# Patient Record
Sex: Male | Born: 1950 | Race: White | Hispanic: No | Marital: Single | Smoking: Current some day smoker
Health system: Southern US, Community
[De-identification: ages and names within clinical notes are randomized; demographics above are authoritative.]

## PROBLEM LIST (undated history)

## (undated) DIAGNOSIS — E785 Hyperlipidemia, unspecified: Secondary | ICD-10-CM

## (undated) DIAGNOSIS — I10 Essential (primary) hypertension: Secondary | ICD-10-CM

## (undated) DIAGNOSIS — F32A Depression, unspecified: Secondary | ICD-10-CM

## (undated) DIAGNOSIS — F419 Anxiety disorder, unspecified: Secondary | ICD-10-CM

## (undated) DIAGNOSIS — E039 Hypothyroidism, unspecified: Secondary | ICD-10-CM

## (undated) DIAGNOSIS — C801 Malignant (primary) neoplasm, unspecified: Secondary | ICD-10-CM

## (undated) DIAGNOSIS — C8330 Diffuse large B-cell lymphoma, unspecified site: Secondary | ICD-10-CM

## (undated) DIAGNOSIS — I2510 Atherosclerotic heart disease of native coronary artery without angina pectoris: Secondary | ICD-10-CM

## (undated) DIAGNOSIS — F0390 Unspecified dementia without behavioral disturbance: Principal | ICD-10-CM

## (undated) DIAGNOSIS — I519 Heart disease, unspecified: Secondary | ICD-10-CM

## (undated) DIAGNOSIS — B20 Human immunodeficiency virus [HIV] disease: Secondary | ICD-10-CM

## (undated) DIAGNOSIS — E079 Disorder of thyroid, unspecified: Secondary | ICD-10-CM

## (undated) DIAGNOSIS — K759 Inflammatory liver disease, unspecified: Secondary | ICD-10-CM

## (undated) DIAGNOSIS — Z5189 Encounter for other specified aftercare: Secondary | ICD-10-CM

## (undated) DIAGNOSIS — I499 Cardiac arrhythmia, unspecified: Secondary | ICD-10-CM

## (undated) DIAGNOSIS — IMO0001 Reserved for inherently not codable concepts without codable children: Secondary | ICD-10-CM

## (undated) DIAGNOSIS — B2700 Gammaherpesviral mononucleosis without complication: Secondary | ICD-10-CM

## (undated) DIAGNOSIS — K219 Gastro-esophageal reflux disease without esophagitis: Secondary | ICD-10-CM

## (undated) DIAGNOSIS — F329 Major depressive disorder, single episode, unspecified: Secondary | ICD-10-CM

## (undated) DIAGNOSIS — J189 Pneumonia, unspecified organism: Secondary | ICD-10-CM

## (undated) DIAGNOSIS — D649 Anemia, unspecified: Secondary | ICD-10-CM

## (undated) DIAGNOSIS — R0602 Shortness of breath: Secondary | ICD-10-CM

## (undated) HISTORY — DX: Atherosclerotic heart disease of native coronary artery without angina pectoris: I25.10

## (undated) HISTORY — DX: Depression, unspecified: F32.A

## (undated) HISTORY — DX: Anxiety disorder, unspecified: F41.9

## (undated) HISTORY — PX: OTHER SURGICAL HISTORY: SHX169

## (undated) HISTORY — DX: Diffuse large B-cell lymphoma, unspecified site: C83.30

## (undated) HISTORY — DX: Anemia, unspecified: D64.9

## (undated) HISTORY — DX: Gammaherpesviral mononucleosis without complication: B27.00

## (undated) HISTORY — PX: CARDIAC SURGERY: SHX584

## (undated) HISTORY — PX: COLONOSCOPY: SHX174

## (undated) HISTORY — DX: Unspecified dementia without behavioral disturbance: F03.90

## (undated) HISTORY — DX: Hyperlipidemia, unspecified: E78.5

## (undated) HISTORY — DX: Encounter for other specified aftercare: Z51.89

## (undated) HISTORY — DX: Hypothyroidism, unspecified: E03.9

## (undated) HISTORY — DX: Major depressive disorder, single episode, unspecified: F32.9

## (undated) HISTORY — PX: TUMOR REMOVAL: SHX12

## (undated) HISTORY — PX: ABDOMINAL SURGERY: SHX537

## (undated) HISTORY — DX: Disorder of thyroid, unspecified: E07.9

## (undated) HISTORY — DX: Reserved for inherently not codable concepts without codable children: IMO0001

## (undated) HISTORY — DX: Gastro-esophageal reflux disease without esophagitis: K21.9

---

## 2011-03-26 ENCOUNTER — Encounter: Payer: Self-pay | Admitting: *Deleted

## 2011-03-26 ENCOUNTER — Emergency Department (HOSPITAL_COMMUNITY)
Admission: EM | Admit: 2011-03-26 | Discharge: 2011-03-26 | Disposition: A | Discharge status: Home / Self Care | Payer: Medicaid Other | Attending: Emergency Medicine | Admitting: Emergency Medicine

## 2011-03-26 DIAGNOSIS — C8589 Other specified types of non-Hodgkin lymphoma, extranodal and solid organ sites: Secondary | ICD-10-CM | POA: Insufficient documentation

## 2011-03-26 DIAGNOSIS — F17200 Nicotine dependence, unspecified, uncomplicated: Secondary | ICD-10-CM | POA: Insufficient documentation

## 2011-03-26 DIAGNOSIS — IMO0002 Reserved for concepts with insufficient information to code with codable children: Secondary | ICD-10-CM | POA: Insufficient documentation

## 2011-03-26 DIAGNOSIS — Z21 Asymptomatic human immunodeficiency virus [HIV] infection status: Secondary | ICD-10-CM | POA: Insufficient documentation

## 2011-03-26 DIAGNOSIS — S90862A Insect bite (nonvenomous), left foot, initial encounter: Secondary | ICD-10-CM

## 2011-03-26 DIAGNOSIS — M7989 Other specified soft tissue disorders: Secondary | ICD-10-CM | POA: Insufficient documentation

## 2011-03-26 HISTORY — DX: Malignant (primary) neoplasm, unspecified: C80.1

## 2011-03-26 HISTORY — DX: Human immunodeficiency virus (HIV) disease: B20

## 2011-03-26 LAB — CBC
Hemoglobin: 9.7 g/dL — ABNORMAL LOW (ref 13.0–17.0)
MCHC: 34.3 g/dL (ref 30.0–36.0)
RDW: 20.3 % — ABNORMAL HIGH (ref 11.5–15.5)
WBC: 1.5 10*3/uL — ABNORMAL LOW (ref 4.0–10.5)

## 2011-03-26 LAB — COMPREHENSIVE METABOLIC PANEL
AST: 14 U/L (ref 0–37)
Albumin: 3.8 g/dL (ref 3.5–5.2)
Alkaline Phosphatase: 99 U/L (ref 39–117)
Chloride: 99 mEq/L (ref 96–112)
Potassium: 4.1 mEq/L (ref 3.5–5.1)
Total Bilirubin: 0.5 mg/dL (ref 0.3–1.2)

## 2011-03-26 LAB — DIFFERENTIAL
Basophils Absolute: 0 10*3/uL (ref 0.0–0.1)
Basophils Relative: 1 % (ref 0–1)
Monocytes Relative: 14 % — ABNORMAL HIGH (ref 3–12)
Neutro Abs: 0.7 10*3/uL — ABNORMAL LOW (ref 1.7–7.7)
Neutrophils Relative %: 43 % (ref 43–77)

## 2011-03-26 MED ORDER — DIPHENHYDRAMINE HCL 25 MG PO CAPS: 25.0000 mg | ORAL_CAPSULE | Freq: Four times a day (QID) | ORAL | Status: AC | PRN

## 2011-03-26 MED ORDER — DIPHENHYDRAMINE HCL 50 MG/ML IJ SOLN
50.0000 mg | Freq: Once | INTRAMUSCULAR | Status: AC
  Administered 2011-03-26: 50 mg via INTRAVENOUS
  Filled 2011-03-26: qty 1

## 2011-03-26 MED ORDER — DIPHENHYDRAMINE HCL 25 MG PO CAPS: 25.0000 mg | ORAL_CAPSULE | Freq: Four times a day (QID) | ORAL | Status: DC | PRN

## 2011-03-26 NOTE — ED Notes (Signed)
Pt sitting in wheelchair  With lt foot elevated on bed and ice pack on it.

## 2011-03-26 NOTE — ED Notes (Signed)
States he was outside and was bitten by something, states his left leg immediately began to swell

## 2011-03-26 NOTE — ED Provider Notes (Signed)
History     CSN: [id number] Arrival date & time: 03/26/2011  8:18 PM  Chief Complaint  Patient presents with  . Leg Swelling   HPI Comments: Patient has a history of HIV and cancer, was sitting outside on the edge of the sidewalk and felt something bite the bottom of his left foot inside his shoe. He noticed almost immediate onset of swelling of the foot and the leg and over the last 3 hours this has gotten gradually worse. It is constant, moderate, gradually getting worse, associated with tingling and paresthesias of his left hand. He denies any rashes, pruritus, difficulty breathing, wheezing, swelling of his throat. He has not had a rash and he has never had any similar reactions in the past. He thinks he saw a large white and run away after he kicked his shoe off. He denies any snakebites. He also denies shortness of breath, cough, chest pain, nausea, vomiting, fever.  The history is provided by the patient and a caregiver.    Past Medical History  Diagnosis Date  . Cancer   . HIV (human immunodeficiency virus infection)     Past Surgical History  Procedure Date  . Cardiac surgery     No family history on file.  History  Substance Use Topics  . Smoking status: Current Everyday Smoker -- 0.5 packs/day  . Smokeless tobacco: Not on file  . Alcohol Use: Yes     rarely      Review of Systems  All other systems reviewed and are negative.    Physical Exam  BP 134/77  Pulse 93  Temp(Src) 98.7 F (37.1 C) (Oral)  Resp 20  Ht 6\' 2"  (1.88 m)  Wt 192 lb (87.091 kg)  BMI 24.65 kg/m2  SpO2 100%  Physical Exam  Nursing note and vitals reviewed. Constitutional: He appears well-developed and well-nourished. No distress.  HENT:  Head: Normocephalic and atraumatic.  Mouth/Throat: Oropharynx is clear and moist. No oropharyngeal exudate.  Eyes: Conjunctivae and EOM are normal. Right eye exhibits no discharge. Left eye exhibits no discharge. No scleral icterus.  Neck: Normal  range of motion. Neck supple. No JVD present. No thyromegaly present.  Cardiovascular: Normal rate, regular rhythm, normal heart sounds and intact distal pulses.  Exam reveals no gallop and no friction rub.   No murmur heard. Pulmonary/Chest: Effort normal and breath sounds normal. No respiratory distress. He has no wheezes. He has no rales.  Abdominal: Soft. Bowel sounds are normal. He exhibits no distension and no mass. There is no tenderness.  Musculoskeletal: Normal range of motion. He exhibits edema (left lower extremity with pitting edema anteriorly over the tibia from the foot to the knee. No edema of the right lower extremity.).  Lymphadenopathy:    He has no cervical adenopathy.  Neurological: He is alert. Coordination normal.  Skin: Skin is warm and dry. No rash noted. No erythema.  Psychiatric: He has a normal mood and affect. His behavior is normal.    ED Course  Procedures  MDM Pulmonary exam is normal, vital signs are normal, rather acute in onset of swelling but suspect insect bite causing the same. We'll check for coagulopathy, LFTs, CBC for potential systemic reaction. Blood pressure is normal, no respiratory fracture. Benadryl ordered, steroids held due to immunocompromised status with HIV.    Pt admits to have recent chemo for lymphoma - results reviewed with pt - afebrile, no other c/o - swelling stable, paresthesias improved, labs reviewed below.  I have asked  pt to return tomorrow for reevaluation when I'm here after 3 PM or sooner if sx worsen.  Results for orders placed during the hospital encounter of 03/26/11  CBC      Component Value Range   WBC 1.5 (*) 4.0 - 10.5 (K/uL)   RBC 2.94 (*) 4.22 - 5.81 (MIL/uL)   Hemoglobin 9.7 (*) 13.0 - 17.0 (g/dL)   HCT 14.7 (*) 82.9 - 52.0 (%)   MCV 96.3  78.0 - 100.0 (fL)   MCH 33.0  26.0 - 34.0 (pg)   MCHC 34.3  30.0 - 36.0 (g/dL)   RDW 56.2 (*) 13.0 - 15.5 (%)   Platelets 120 (*) 150 - 400 (K/uL)  DIFFERENTIAL       Component Value Range   Neutrophils Relative 43  43 - 77 (%)   Neutro Abs 0.7 (*) 1.7 - 7.7 (K/uL)   Lymphocytes Relative 38  12 - 46 (%)   Lymphs Abs 0.6 (*) 0.7 - 4.0 (K/uL)   Monocytes Relative 14 (*) 3 - 12 (%)   Monocytes Absolute 0.2  0.1 - 1.0 (K/uL)   Eosinophils Relative 5  0 - 5 (%)   Eosinophils Absolute 0.1  0.0 - 0.7 (K/uL)   Basophils Relative 1  0 - 1 (%)   Basophils Absolute 0.0  0.0 - 0.1 (K/uL)  COMPREHENSIVE METABOLIC PANEL      Component Value Range   Sodium 135  135 - 145 (mEq/L)   Potassium 4.1  3.5 - 5.1 (mEq/L)   Chloride 99  96 - 112 (mEq/L)   CO2 26  19 - 32 (mEq/L)   Glucose, Bld 143 (*) 70 - 99 (mg/dL)   BUN 6  6 - 23 (mg/dL)   Creatinine, Ser 8.65  0.50 - 1.35 (mg/dL)   Calcium 8.7  8.4 - 78.4 (mg/dL)   Total Protein 6.8  6.0 - 8.3 (g/dL)   Albumin 3.8  3.5 - 5.2 (g/dL)   AST 14  0 - 37 (U/L)   ALT 28  0 - 53 (U/L)   Alkaline Phosphatase 99  39 - 117 (U/L)   Total Bilirubin 0.5  0.3 - 1.2 (mg/dL)   GFR calc non Af Amer >60  >60 (mL/min)   GFR calc Af Amer >60  >60 (mL/min)  APTT      Component Value Range   aPTT 32  24 - 37 (seconds)  PROTIME-INR      Component Value Range   Prothrombin Time 13.6  11.6 - 15.2 (seconds)   INR 1.02  0.00 - 1.49    No results found.    Vida Roller, MD 03/26/11 2207

## 2011-03-26 NOTE — ED Notes (Signed)
Pt states was outside and a bug or something crawled in his shoe where as he felt it bite him and his lt foot/leg began to swell.   Lt upper leg is warm to touch and reddened.

## 2011-03-26 NOTE — ED Notes (Signed)
MD at bedside. Dr Hyacinth Meeker at bedside talking to pt.

## 2011-03-27 ENCOUNTER — Emergency Department (HOSPITAL_COMMUNITY)
Admission: EM | Admit: 2011-03-27 | Discharge: 2011-03-27 | Disposition: A | Discharge status: Home / Self Care | Payer: Medicaid Other | Attending: Emergency Medicine | Admitting: Emergency Medicine

## 2011-03-27 ENCOUNTER — Encounter (HOSPITAL_COMMUNITY): Payer: Self-pay | Admitting: *Deleted

## 2011-03-27 DIAGNOSIS — M7989 Other specified soft tissue disorders: Secondary | ICD-10-CM | POA: Insufficient documentation

## 2011-03-27 DIAGNOSIS — Z859 Personal history of malignant neoplasm, unspecified: Secondary | ICD-10-CM | POA: Insufficient documentation

## 2011-03-27 DIAGNOSIS — Z21 Asymptomatic human immunodeficiency virus [HIV] infection status: Secondary | ICD-10-CM | POA: Insufficient documentation

## 2011-03-27 DIAGNOSIS — R609 Edema, unspecified: Secondary | ICD-10-CM | POA: Insufficient documentation

## 2011-03-27 DIAGNOSIS — F17200 Nicotine dependence, unspecified, uncomplicated: Secondary | ICD-10-CM | POA: Insufficient documentation

## 2011-03-27 HISTORY — DX: Heart disease, unspecified: I51.9

## 2011-03-27 NOTE — ED Notes (Signed)
Pt has swelling to his left lower extremity since being bitten by an insect last night. Pt was seen in ED and told to return for recheck today.

## 2011-03-27 NOTE — ED Notes (Signed)
Pt states he was bitten by an insect yesterday and was seen in ed. Pt states he was told to return today for recheck.

## 2011-03-27 NOTE — ED Provider Notes (Signed)
History     CSN: [id number] Arrival date & time: 03/27/2011  4:32 PM  Chief Complaint  Patient presents with  . Insect Bite   HPI Comments: Patient had insect bite to the bottom of his left foot causing swelling in his left lower extremity below the knee. Onset yesterday, I personally evaluated him yesterday and found him to have no significant allergic reaction. I asked him to come back within 24 hours for a recheck. He states that the swelling is gradually improving, the paresthesias in his left hand and left foot are resolved and has had no fevers redness or pain in his leg. Symptoms are mild, constant, gradually improving.  The history is provided by the patient, medical records and a caregiver.    Past Medical History  Diagnosis Date  . Cancer   . HIV (human immunodeficiency virus infection)   . Heart disease     Past Surgical History  Procedure Date  . Cardiac surgery     History reviewed. No pertinent family history.  History  Substance Use Topics  . Smoking status: Current Everyday Smoker -- 0.5 packs/day  . Smokeless tobacco: Not on file  . Alcohol Use: Yes     rarely      Review of Systems  Constitutional: Negative for fever.  Cardiovascular: Positive for leg swelling.  Neurological: Negative for weakness and numbness.    Physical Exam  BP 118/72  Pulse 60  Temp(Src) 98.6 F (37 C) (Oral)  Resp 18  Ht 6\' 2"  (1.88 m)  Wt 192 lb (87.091 kg)  BMI 24.65 kg/m2  SpO2 100%  Physical Exam  Nursing note and vitals reviewed. Constitutional: He appears well-developed and well-nourished. No distress.  HENT:  Head: Normocephalic and atraumatic.  Eyes: Conjunctivae are normal. No scleral icterus.  Pulmonary/Chest: Effort normal.  Musculoskeletal: He exhibits edema (left lower extremity edema 1+ pitting below the knee.). He exhibits no tenderness.  Neurological: He is alert.  Skin: Skin is warm and dry. No rash noted. He is not diaphoretic.    ED Course    Procedures  MDM No ongoing signs of significant reaction, gradually improving suspect we'll continue to do same. I verbally given him and in written form instructions for followup and to return if symptoms should worsen.      Vida Roller, MD 03/27/11 [phone]

## 2011-03-30 ENCOUNTER — Emergency Department (HOSPITAL_COMMUNITY)
Admission: EM | Admit: 2011-03-30 | Discharge: 2011-03-31 | Disposition: A | Discharge status: Other Acute Inpatient Hospital | Payer: Medicaid Other | Attending: Emergency Medicine | Admitting: Emergency Medicine

## 2011-03-30 ENCOUNTER — Encounter (HOSPITAL_COMMUNITY): Payer: Self-pay | Admitting: *Deleted

## 2011-03-30 DIAGNOSIS — Z859 Personal history of malignant neoplasm, unspecified: Secondary | ICD-10-CM | POA: Insufficient documentation

## 2011-03-30 DIAGNOSIS — IMO0002 Reserved for concepts with insufficient information to code with codable children: Secondary | ICD-10-CM | POA: Insufficient documentation

## 2011-03-30 DIAGNOSIS — B20 Human immunodeficiency virus [HIV] disease: Secondary | ICD-10-CM | POA: Insufficient documentation

## 2011-03-30 NOTE — ED Notes (Signed)
Called [facility] and they stated there wer no rooms avaliable

## 2011-03-30 NOTE — ED Notes (Signed)
States he is upset about the living situation at [facility], states he was referred to a place in [place] and was not alloe=wed to stay, states he was dropped off here for evaluation, wishes to talk to law enforcement about the poor care a t [place]

## 2011-03-31 NOTE — ED Notes (Signed)
Pt. Alert--ate 100% from dinner tray--advised of status and reason for delay.

## 2011-03-31 NOTE — ED Notes (Signed)
Pt. Has his meds with him and is wanting to take them so he will not miss a dose--in pre-packaged daily dose pack per pharmacy---Gave him his a.m po. Meds: Lexothyroxine 112 mcg, Thiamine 100 mg, Plavix 75 mg, Colace, Pepcid 20 mg, Diflucan 200 mg, Isentress 400 mg, Imdur 20 mg, Mag. Ox 400 mg, Metoprol ER 12.5 mg, Truvada and Valtrex 500 mg---Tolerated well---lying on carrier resting.

## 2011-03-31 NOTE — ED Notes (Signed)
Lying on carrier resting--calm atmosphere--requesting pen and paper--given to pt.  Sitter outside pt's door.

## 2011-03-31 NOTE — ED Provider Notes (Signed)
History     CSN: [id number] Arrival date & time: 03/30/2011  9:40 PM  Chief Complaint  Patient presents with  . Agitation   HPI Comments: Patient presents from a nursing facility for medical clearance. According to the patient he became very upset in the facility where he is living due to what he describes R. inadequate living conditions. He notes that there was no toilet paper has been no fever chills and the bathrooms and is treated inappropriately by staff members. Several days ago he had developed a bug bite and swelling of one of his legs and when asked to go to the emergency department he was told that he would not priority at that time. His feelings of discontented gradually gotten worse and today he verbally express those feelings causing some contention with the staff at the facility. According to the patient the director of the staff removed from the staff and sent him to a transitional house where he was to await placement at another facility. However the director of the initial institution failed to fax over any information regarding his health history her medical records, nor was there confirmed at that facility and thus he was redirected here for placement. The patient has no depression, suicidal thoughts, hallucinations or any medical complaints at this time.  The history is provided by medical records and the patient.    Past Medical History  Diagnosis Date  . Cancer   . HIV (human immunodeficiency virus infection)   . Heart disease     Past Surgical History  Procedure Date  . Cardiac surgery     No family history on file.  History  Substance Use Topics  . Smoking status: Current Everyday Smoker -- 0.5 packs/day  . Smokeless tobacco: Not on file  . Alcohol Use: Yes     rarely      Review of Systems  All other systems reviewed and are negative.    Physical Exam  BP 131/84  Pulse 95  Temp(Src) 98.6 F (37 C) (Oral)  Resp 20  Ht 6\' 2"  (1.88 m)  Wt 190 lb  (86.183 kg)  BMI 24.39 kg/m2  SpO2 90%  Physical Exam  Nursing note and vitals reviewed. Constitutional: He appears well-developed and well-nourished. No distress.  HENT:  Head: Normocephalic and atraumatic.  Mouth/Throat: Oropharynx is clear and moist. No oropharyngeal exudate.  Eyes: Conjunctivae and EOM are normal. Pupils are equal, round, and reactive to light. Right eye exhibits no discharge. Left eye exhibits no discharge. No scleral icterus.  Neck: Normal range of motion. Neck supple. No JVD present. No thyromegaly present.  Cardiovascular: Normal rate, regular rhythm, normal heart sounds and intact distal pulses.  Exam reveals no gallop and no friction rub.   No murmur heard. Pulmonary/Chest: Effort normal and breath sounds normal. No respiratory distress. He has no wheezes. He has no rales.  Abdominal: Soft. Bowel sounds are normal. He exhibits no distension and no mass. There is no tenderness.  Musculoskeletal: Normal range of motion. He exhibits edema (mild but improving edema of the left lower extremity without any erythema or tenderness.). He exhibits no tenderness.  Lymphadenopathy:    He has no cervical adenopathy.  Neurological: He is alert. Coordination normal.  Skin: Skin is warm and dry. No rash noted. No erythema.  Psychiatric: He has a normal mood and affect. His behavior is normal.    ED Course  Procedures  MDM Patient has no agitation and has a normal mental status and  affect at this time. Vital signs are normal, I discussed his care with the facility at [facility]" and Bonita Quin at that facility states that there is not a bed available at this time however they would be willing to take him there this is a bed opens up pending his medical record transfer from his initial facility. He will need observation overnight and social work consult in the morning to help with placement. Ella with [facility] has also seen him and agrees that there is no need for psychiatric  placement.  Pt has no medical complaint and no acute psychiatric condition - labs unnecessary at this time.  Change of shift - care signed out to Dr. Kirke Corin, MD 03/31/11 [phone]

## 2011-03-31 NOTE — ED Notes (Signed)
Still waiting for social worker to arrive---Beeped again--waiting for call back

## 2011-03-31 NOTE — ED Notes (Signed)
Tommy from [facility] in to see pt.

## 2011-03-31 NOTE — ED Provider Notes (Signed)
Pt seen by [facility] and DCFS involved trying to find placement.  The patient remains calm and cooperative in the emergency department and denies any threats to hurt himself or others he denies any suicidal or homicidal ideation denies any hallucinations or other concerns.  Hurman Horn, MD 03/31/11 [phone]

## 2011-03-31 NOTE — ED Notes (Signed)
Alert, talking, no distress. 

## 2011-03-31 NOTE — ED Notes (Signed)
Signed out of pt's care in error.

## 2011-03-31 NOTE — ED Notes (Signed)
Report called to supervisor.  D/c to be released to go to [facility] via taxi

## 2011-03-31 NOTE — ED Notes (Signed)
Error--signed out of pt's care in error

## 2011-03-31 NOTE — ED Notes (Signed)
Social Worker has been contacted again--she is in a meeting but, will contact someone else and will call us back--Pt. Kept advised of status and is agreeable to going to [facility].

## 2011-03-31 NOTE — ED Notes (Signed)
Supervisor in dept to  Assist in pt being sent to family care .

## 2011-03-31 NOTE — ED Notes (Signed)
Lunch tray served--pt. Eating from tray--sitting on carrier

## 2011-03-31 NOTE — ED Notes (Signed)
Pt. Is awake---lunch tray served---eating and tolerating welll

## 2011-03-31 NOTE — ED Notes (Signed)
Awakened for breakfast but, states he prefers to sleep and will eat later---Sitter at pt's door with clear view of patient.

## 2011-03-31 NOTE — ED Notes (Signed)
[facility] called and pt not a patient of those and may be late this evening to see pt. Advised Dr Fonnie Jarvis and vo to call [facility]. Tommy from [facility] called around [phone].

## 2011-03-31 NOTE — ED Notes (Signed)
Darrall Dears with [facility] called stating they do have a bed for this pt. But she will need the FL2 form completed---Kera, Social Worker called--she will send someone here to complete the form.

## 2011-03-31 NOTE — ED Notes (Signed)
Beeped Social Worker on call [phone] to ED.  Also so beeped through Operator and still waiting for return call.

## 2011-03-31 NOTE — ED Notes (Signed)
Spoke with on call [facility], stated pt will have to wait until am for SW here. SW aware a possibility pt may lose bed available- SW stated he will have to wait until am. Olegario Messier RN aware.

## 2011-03-31 NOTE — ED Notes (Signed)
Talked to [place] the Child psychotherapist in formed that she was on the way to [employer] for a meeting and she would contact [place] from [phone] to come and fill out our FL2 papers so pt. Can go to nursing home. Diannia Ruder called back and said [place] [facility] would be here in 30 mins.

## 2011-03-31 NOTE — ED Provider Notes (Signed)
Pt is being accepted to [facility]. FL2 was completed. Medical and psychiatrically cleared by preceding physicians. Has been awaiting ongonig social work paperwork to be completed  Lyanne Co, MD 03/31/11 2104

## 2011-03-31 NOTE — ED Notes (Signed)
Report rcd from p.m. Shift--sleeping on carrier with sitter outside door.  Awaiting breakfast tray to arrive so I can awaken pt.

## 2011-04-20 ENCOUNTER — Encounter (HOSPITAL_COMMUNITY): Payer: Self-pay | Admitting: Oncology

## 2011-04-20 ENCOUNTER — Encounter (HOSPITAL_COMMUNITY): Payer: Medicaid Other

## 2011-04-20 ENCOUNTER — Ambulatory Visit (HOSPITAL_COMMUNITY): Payer: Medicaid Other | Admitting: Oncology

## 2011-04-20 ENCOUNTER — Encounter (HOSPITAL_COMMUNITY): Payer: Medicaid Other | Attending: Oncology | Admitting: Oncology

## 2011-04-20 ENCOUNTER — Encounter (HOSPITAL_BASED_OUTPATIENT_CLINIC_OR_DEPARTMENT_OTHER): Payer: Medicaid Other

## 2011-04-20 VITALS — BP 116/67 | HR 86 | Temp 97.4°F | Ht 73.0 in | Wt 197.0 lb

## 2011-04-20 DIAGNOSIS — C8590 Non-Hodgkin lymphoma, unspecified, unspecified site: Secondary | ICD-10-CM

## 2011-04-20 DIAGNOSIS — E039 Hypothyroidism, unspecified: Secondary | ICD-10-CM

## 2011-04-20 DIAGNOSIS — B20 Human immunodeficiency virus [HIV] disease: Secondary | ICD-10-CM | POA: Insufficient documentation

## 2011-04-20 DIAGNOSIS — C8589 Other specified types of non-Hodgkin lymphoma, extranodal and solid organ sites: Secondary | ICD-10-CM | POA: Insufficient documentation

## 2011-04-20 DIAGNOSIS — B2700 Gammaherpesviral mononucleosis without complication: Secondary | ICD-10-CM

## 2011-04-20 DIAGNOSIS — B2790 Infectious mononucleosis, unspecified without complication: Secondary | ICD-10-CM

## 2011-04-20 DIAGNOSIS — Z21 Asymptomatic human immunodeficiency virus [HIV] infection status: Secondary | ICD-10-CM

## 2011-04-20 DIAGNOSIS — G609 Hereditary and idiopathic neuropathy, unspecified: Secondary | ICD-10-CM | POA: Insufficient documentation

## 2011-04-20 DIAGNOSIS — F0390 Unspecified dementia without behavioral disturbance: Secondary | ICD-10-CM

## 2011-04-20 DIAGNOSIS — C8330 Diffuse large B-cell lymphoma, unspecified site: Secondary | ICD-10-CM

## 2011-04-20 DIAGNOSIS — Z79899 Other long term (current) drug therapy: Secondary | ICD-10-CM | POA: Insufficient documentation

## 2011-04-20 DIAGNOSIS — E785 Hyperlipidemia, unspecified: Secondary | ICD-10-CM

## 2011-04-20 DIAGNOSIS — I2510 Atherosclerotic heart disease of native coronary artery without angina pectoris: Secondary | ICD-10-CM

## 2011-04-20 HISTORY — DX: Hyperlipidemia, unspecified: E78.5

## 2011-04-20 HISTORY — DX: Asymptomatic human immunodeficiency virus (hiv) infection status: Z21

## 2011-04-20 HISTORY — DX: Hypothyroidism, unspecified: E03.9

## 2011-04-20 HISTORY — DX: Atherosclerotic heart disease of native coronary artery without angina pectoris: I25.10

## 2011-04-20 HISTORY — DX: Human immunodeficiency virus (HIV) disease: B20

## 2011-04-20 HISTORY — DX: Diffuse large B-cell lymphoma, unspecified site: C83.30

## 2011-04-20 HISTORY — DX: Unspecified dementia, unspecified severity, without behavioral disturbance, psychotic disturbance, mood disturbance, and anxiety: F03.90

## 2011-04-20 HISTORY — DX: Gammaherpesviral mononucleosis without complication: B27.00

## 2011-04-20 LAB — DIFFERENTIAL
Eosinophils Absolute: 0.2 10*3/uL (ref 0.0–0.7)
Eosinophils Relative: 6 % — ABNORMAL HIGH (ref 0–5)
Lymphs Abs: 0.7 10*3/uL (ref 0.7–4.0)
Monocytes Relative: 1 % — ABNORMAL LOW (ref 3–12)

## 2011-04-20 LAB — CBC
Hemoglobin: 12.7 g/dL — ABNORMAL LOW (ref 13.0–17.0)
MCH: 31.3 pg (ref 26.0–34.0)
MCV: 94.1 fL (ref 78.0–100.0)
RBC: 4.06 MIL/uL — ABNORMAL LOW (ref 4.22–5.81)

## 2011-04-20 MED ORDER — PEGFILGRASTIM INJECTION 6 MG/0.6ML
SUBCUTANEOUS | Status: AC
  Filled 2011-04-20: qty 0.6

## 2011-04-20 MED ORDER — PEGFILGRASTIM INJECTION 6 MG/0.6ML
6.0000 mg | Freq: Once | SUBCUTANEOUS | Status: AC
  Administered 2011-04-20: 6 mg via SUBCUTANEOUS

## 2011-04-20 NOTE — Progress Notes (Signed)
[facility] NEW PATIENT EVALUATION   Name: Troy Holden Date: 04/20/2011 MRN: [id number] DOB: 01/09/1951    CC: No primary provider on file.  No ref. provider found   DIAGNOSIS: The primary encounter diagnosis was Non Hodgkin's lymphoma. Diagnoses of DLBCL (diffuse large B cell lymphoma), EBV positive mononucleosis syndrome, and HIV (human immunodeficiency virus infection) were also pertinent to this visit.   HISTORY OF PRESENT ILLNESS:Troy Holden is a 60 y.o. 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 Benita Gutter.  He is S/P 5 cycles of R-EPOCH chemotherapy which began on 04/14/11.  He is tolerating therapy well with no complaints of nausea or vomiting.      He presently lives in an assisted living facility.  He does not think he needs the medical assistance the facility is providing and therefore is actively looking for an apartment in [place], [place].  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  He is presently not on any medication for this.    The patient presents for his Neulasta 6 mg injection today and CBC w/ diff blood work.  The results will be faxed to [phone] (Attn: Dr. Benita Gutter).  The patient reports that he is due to return to [facility] on 04/30/11.  All questions were answered.  We are happy to be this patient's local support while he is undergoing chemotherapy and treatment under [facility]   FAMILY HISTORY: family history includes Colon cancer in his brother and mother.   PAST MEDICAL HISTORY:  has a past medical history of Heart disease; Cancer; HIV (human immunodeficiency virus infection); Anemia; Anxiety; Blood transfusion;  Cataract; Depression; GERD (gastroesophageal reflux disease); Thyroid disease; DLBCL (diffuse large B cell lymphoma) (04/20/2011); EBV positive mononucleosis syndrome (04/20/2011); and HIV (human immunodeficiency virus infection) (04/20/2011).       CURRENT MEDICATIONS: [name] had no medications administered during this visit.   SOCIAL HISTORY:  reports that he quit smoking about 3 weeks ago. He does not have any smokeless tobacco history on file. He reports that he drinks alcohol. He reports that he does not use illicit drugs.     ALLERGIES: Xylocaine and Trazodone and nefazodone   LABORATORY DATA:  Results for orders placed in visit on 04/20/11 (from the past 48 hour(s))  CBC     Status: Abnormal   Collection Time   04/20/11  9:11 AM      Component Value Range Comment   WBC 4.2  4.0 - 10.5 (K/uL)    RBC 4.06 (*) 4.22 - 5.81 (MIL/uL)    Hemoglobin 12.7 (*) 13.0 - 17.0 (g/dL)    HCT 82.9 (*) 56.2 - 52.0 (%)    MCV 94.1  78.0 - 100.0 (fL)    MCH 31.3  26.0 - 34.0 (pg)    MCHC 33.2  30.0 - 36.0 (g/dL)    RDW 13.0  86.5 - 78.4 (%)    Platelets 140 (*) 150 - 400 (K/uL)   DIFFERENTIAL     Status: Abnormal   Collection Time   04/20/11  9:11 AM      Component Value Range Comment   Neutrophils Relative  77  43 - 77 (%)    Neutro Abs 3.2  1.7 - 7.7 (K/uL)    Lymphocytes Relative 17  12 - 46 (%)    Lymphs Abs 0.7  0.7 - 4.0 (K/uL)    Monocytes Relative 1 (*) 3 - 12 (%)    Monocytes Absolute 0.0 (*) 0.1 - 1.0 (K/uL)    Eosinophils Relative 6 (*) 0 - 5 (%)    Eosinophils Absolute 0.2  0.0 - 0.7 (K/uL)    Basophils Relative 0  0 - 1 (%)    Basophils Absolute 0.0  0.0 - 0.1 (K/uL)          REVIEW OF SYSTEMS: Patient reports no health concerns.   PHYSICAL EXAM:  height is 6\' 1"  (1.854 m) and weight is 197 lb (89.359 kg). His oral temperature is 97.4 F (36.3 C). His blood pressure is 116/67 and his pulse is 86.  General appearance: alert, cooperative, appears older than stated  age and no distress Head: Normocephalic, without obvious abnormality, atraumatic, right eye is scarred secondary to trauma, followed at [facility] Neck: no adenopathy, no carotid bruit, supple, symmetrical, trachea midline and thyroid not enlarged, symmetric, no tenderness/mass/nodules Lymph nodes: Cervical, supraclavicular, and axillary nodes normal. Resp: clear to auscultation bilaterally and normal percussion bilaterally Cardio: regular rate and rhythm, S1, S2 normal, no murmur, click, rub or gallop GI: soft, non-tender; bowel sounds normal; no masses,  no organomegaly Extremities: extremities normal, atraumatic, no cyanosis or edema Neurologic: Grossly normal     IMPRESSION:  1. 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 Benita Gutter, S/P 5 cycles of R-EPOCH with a nice response. 2. HIV +, on active HAART treatment under the supervision of [facility] 3. Left sided Peripheral neuropathy 4. 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 Neulasta 6 mg injection today for WBC support. 2. Lab work today and then twice weekly until seen at [facility] on 10/5: CBC diff.  Fax result to [phone]. 3. Maintain appointment to se Dr. Malen Gauze on 04/30/11. 4. We are glad to be this patient's local support.  He was informed to please call us with any issues.    All questions were answered.  He knows to call the clinic with any questions or concerns.  Patient's case discussed with and seen with Glenford Peers, MD.  Dellis Anes

## 2011-04-20 NOTE — Progress Notes (Signed)
Troy Holden presents today for injection per MD orders. Neulasta administered SQ in right abd  Administration without incident. Patient tolerated well.

## 2011-04-20 NOTE — Progress Notes (Signed)
Labs drawn today for cbc/diff 

## 2011-04-22 ENCOUNTER — Encounter (HOSPITAL_COMMUNITY): Payer: Self-pay

## 2011-04-22 ENCOUNTER — Emergency Department (HOSPITAL_COMMUNITY)
Admission: EM | Admit: 2011-04-22 | Discharge: 2011-04-23 | Disposition: A | Discharge status: Home / Self Care | Payer: Medicaid Other | Attending: Emergency Medicine | Admitting: Emergency Medicine

## 2011-04-22 DIAGNOSIS — R079 Chest pain, unspecified: Secondary | ICD-10-CM | POA: Insufficient documentation

## 2011-04-22 DIAGNOSIS — C8590 Non-Hodgkin lymphoma, unspecified, unspecified site: Secondary | ICD-10-CM

## 2011-04-22 DIAGNOSIS — Z9221 Personal history of antineoplastic chemotherapy: Secondary | ICD-10-CM

## 2011-04-22 DIAGNOSIS — M25559 Pain in unspecified hip: Secondary | ICD-10-CM | POA: Insufficient documentation

## 2011-04-22 DIAGNOSIS — E039 Hypothyroidism, unspecified: Secondary | ICD-10-CM | POA: Insufficient documentation

## 2011-04-22 DIAGNOSIS — B20 Human immunodeficiency virus [HIV] disease: Secondary | ICD-10-CM | POA: Insufficient documentation

## 2011-04-22 DIAGNOSIS — C8589 Other specified types of non-Hodgkin lymphoma, extranodal and solid organ sites: Secondary | ICD-10-CM | POA: Insufficient documentation

## 2011-04-22 DIAGNOSIS — R42 Dizziness and giddiness: Secondary | ICD-10-CM | POA: Insufficient documentation

## 2011-04-22 DIAGNOSIS — I2510 Atherosclerotic heart disease of native coronary artery without angina pectoris: Secondary | ICD-10-CM | POA: Insufficient documentation

## 2011-04-22 DIAGNOSIS — Z87891 Personal history of nicotine dependence: Secondary | ICD-10-CM | POA: Insufficient documentation

## 2011-04-22 DIAGNOSIS — Z79899 Other long term (current) drug therapy: Secondary | ICD-10-CM | POA: Insufficient documentation

## 2011-04-22 DIAGNOSIS — R51 Headache: Secondary | ICD-10-CM | POA: Insufficient documentation

## 2011-04-22 DIAGNOSIS — K219 Gastro-esophageal reflux disease without esophagitis: Secondary | ICD-10-CM | POA: Insufficient documentation

## 2011-04-22 NOTE — ED Notes (Signed)
Left hip pain since this am, denies any known injury

## 2011-04-23 ENCOUNTER — Emergency Department (HOSPITAL_COMMUNITY): Payer: Medicaid Other

## 2011-04-23 ENCOUNTER — Encounter (HOSPITAL_COMMUNITY): Payer: Medicaid Other

## 2011-04-23 ENCOUNTER — Other Ambulatory Visit: Payer: Self-pay

## 2011-04-23 DIAGNOSIS — C8590 Non-Hodgkin lymphoma, unspecified, unspecified site: Secondary | ICD-10-CM

## 2011-04-23 LAB — DIFFERENTIAL
Basophils Absolute: 0 10*3/uL (ref 0.0–0.1)
Lymphocytes Relative: 14 % (ref 12–46)
Lymphs Abs: 0.4 10*3/uL — ABNORMAL LOW (ref 0.7–4.0)
Monocytes Absolute: 0 10*3/uL — ABNORMAL LOW (ref 0.1–1.0)
Neutro Abs: 2.4 10*3/uL (ref 1.7–7.7)

## 2011-04-23 LAB — COMPREHENSIVE METABOLIC PANEL
BUN: 11 mg/dL (ref 6–23)
CO2: 28 mEq/L (ref 19–32)
Chloride: 99 mEq/L (ref 96–112)
Creatinine, Ser: 0.63 mg/dL (ref 0.50–1.35)
GFR calc Af Amer: >60 mL/min (ref >60)
GFR calc non Af Amer: >60 mL/min (ref >60)
Glucose, Bld: 126 mg/dL — ABNORMAL HIGH (ref 70–99)
Total Bilirubin: 0.7 mg/dL (ref 0.3–1.2)

## 2011-04-23 LAB — CBC
HCT: 33 % — ABNORMAL LOW (ref 39.0–52.0)
Platelets: 86 10*3/uL — ABNORMAL LOW (ref 150–400)
RBC: 3.54 MIL/uL — ABNORMAL LOW (ref 4.22–5.81)
RDW: 15.8 % — ABNORMAL HIGH (ref 11.5–15.5)
WBC: 3.1 10*3/uL — ABNORMAL LOW (ref 4.0–10.5)

## 2011-04-23 LAB — CK TOTAL AND CKMB (NOT AT ARMC)
CK, MB: 3.1 ng/mL (ref 0.3–4.0)
Total CK: 33 U/L (ref 7–232)

## 2011-04-23 MED ORDER — HYDROCODONE-ACETAMINOPHEN 5-500 MG PO TABS: 1.0000 | ORAL_TABLET | Freq: Four times a day (QID) | ORAL | Status: DC | PRN

## 2011-04-23 MED ORDER — SODIUM CHLORIDE 0.9 % IV SOLN
Freq: Once | INTRAVENOUS | Status: AC
  Administered 2011-04-23: 1000 mL via INTRAVENOUS

## 2011-04-23 NOTE — ED Provider Notes (Addendum)
History     CSN: [id number] Arrival date & time: 04/22/2011 10:32 PM  Chief Complaint  Patient presents with  . Hip Pain    left    (Consider location/radiation/quality/duration/timing/severity/associated sxs/prior treatment) HPI Comments: History of HIV, Lymphoma.  Completed chemotherapy about 2 weeks ago.  Presents complaining of not feeling well, pain in the right hip, chest pain, headache, feels dizzy.  Denies any fever.  Worse with standing and walking.    Patient is a 60 y.o. male presenting with hip pain. The history is provided by the patient.  Hip Pain This is a new problem.  There is no radiation of the pain.  It is sharp in nature.  There has been no prior treatment.  Past Medical History  Diagnosis Date  . Heart disease   . Cancer     lymphoma  . HIV (human immunodeficiency virus infection)   . Anemia   . Anxiety   . Blood transfusion   . Cataract   . Depression   . GERD (gastroesophageal reflux disease)   . Thyroid disease     low thyroid  . 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 HIV (human immunodeficiency virus infection) 04/20/2011  . Dementia 04/20/2011  . CAD (coronary artery disease) 04/20/2011  . Hypothyroidism 04/20/2011  . Dyslipidemia 04/20/2011    Past Surgical History  Procedure Date  . Cardiac surgery     Family History  Problem Relation Age of Onset  . Colon cancer Mother   . Colon cancer Brother     History  Substance Use Topics  . Smoking status: Former Smoker -- 1.0 packs/day for 10 years    Quit date: 03/30/2011  . Smokeless tobacco: Not on file  . Alcohol Use: Yes     rarely      Review of Systems  All other systems reviewed and are negative.    Allergies  Xylocaine and Trazodone and nefazodone  Home Medications   Current Outpatient Rx  Name Route Sig Dispense Refill  . CLOPIDOGREL BISULFATE 75 MG PO TABS Oral Take 75 mg by mouth daily.      Marland Kitchen  EMTRICITABINE-TENOFOVIR 200-300 MG PO TABS Oral Take 1 tablet by mouth daily.      Marland Kitchen FAMOTIDINE 20 MG PO TABS Oral Take 20 mg by mouth 2 (two) times daily.      Marland Kitchen FLUCONAZOLE 200 MG PO TABS Oral Take 200 mg by mouth daily.      . ISOSORBIDE MONONITRATE CR 30 MG PO TB24 Oral Take 30 mg by mouth daily.      Marland Kitchen LEVOTHYROXINE SODIUM 112 MCG PO TABS Oral Take 112 mcg by mouth daily.      Marland Kitchen METOPROLOL SUCCINATE 25 MG PO TB24 Oral Take 12.5 mg by mouth daily.      Marland Kitchen NICOTINE 21 MG/24HR TD PT24 Transdermal Place 1 patch onto the skin daily.      Marland Kitchen OLANZAPINE 2.5 MG PO TABS Oral Take 2.5 mg by mouth 3 (three) times daily as needed. For anxiety    . OMEPRAZOLE 20 MG PO CPDR Oral Take 20 mg by mouth daily.      Marland Kitchen PRAVASTATIN SODIUM 40 MG PO TABS Oral Take 40 mg by mouth daily.      Marland Kitchen RALTEGRAVIR POTASSIUM 400 MG PO TABS Oral Take 400 mg by mouth 2 (two) times daily.      . THIAMINE HCL 100 MG PO TABS Oral Take  100 mg by mouth daily.      Marland Kitchen VALACYCLOVIR HCL 500 MG PO TABS Oral Take 500 mg by mouth daily.      Marland Kitchen ZOLPIDEM TARTRATE 10 MG PO TABS Oral Take 10 mg by mouth at bedtime as needed. To help sleep     . ACETAMINOPHEN 650 MG PO TBCR Oral Take 650 mg by mouth every 6 (six) hours as needed.      Marland Kitchen DOCUSATE SODIUM 100 MG PO CAPS Oral Take 100 mg by mouth 2 (two) times daily.      Marland Kitchen LORAZEPAM 1 MG PO TABS Oral Take 2 mg by mouth every 6 (six) hours as needed. For nausea    . MAGNESIUM OXIDE 400 MG PO TABS Oral Take 400 mg by mouth 3 (three) times daily.      Marland Kitchen NITROGLYCERIN 0.4 MG SL SUBL Sublingual Place 0.4 mg under the tongue every 5 (five) minutes as needed.      Marland Kitchen POLYETHYLENE GLYCOL 3350 PO PACK Oral Take 17 g by mouth daily.      Marland Kitchen PROCHLORPERAZINE MALEATE 10 MG PO TABS Oral Take 10 mg by mouth every 8 (eight) hours as needed. For nausea and vomiting    . SENNOSIDES 17.2 MG PO TABS Oral Take 2 tablets by mouth at bedtime.     . TRAZODONE HCL 50 MG PO TABS Oral Take 100 mg by mouth at bedtime as  needed. For sleep     . TRIAMCINOLONE ACETONIDE 0.1 % EX CREA Topical Apply 1 application topically daily.        BP 92/54  Pulse 73  SpO2 98%  Physical Exam  Constitutional: He is oriented to person, place, and time. He appears well-developed and well-nourished. No distress.  HENT:  Head: Normocephalic and atraumatic.  Mouth/Throat: Oropharynx is clear and moist. No oropharyngeal exudate.  Eyes: EOM are normal. Pupils are equal, round, and reactive to light.  Neck: Normal range of motion. Neck supple. No tracheal deviation present.  Cardiovascular: Normal rate and regular rhythm.  Exam reveals no gallop and no friction rub.   No murmur heard. Pulmonary/Chest: Effort normal and breath sounds normal. No respiratory distress. He has no wheezes. He has no rales.  Abdominal: Soft. Bowel sounds are normal. He exhibits no distension. There is no tenderness.  Musculoskeletal: Normal range of motion.  Lymphadenopathy:    He has no cervical adenopathy.  Neurological: He is alert and oriented to person, place, and time. He has normal reflexes.  Skin: Skin is warm and dry. He is not diaphoretic.  Psychiatric: He has a normal mood and affect.    ED Course  Procedures (including critical care time)   Labs Reviewed  CBC  DIFFERENTIAL  CULTURE, BLOOD (ROUTINE X 2)  CULTURE, BLOOD (ROUTINE X 2)  COMPREHENSIVE METABOLIC PANEL  CK TOTAL AND CKMB  TROPONIN I   No results found.   No diagnosis found.    MDM  Labs okay, no neutropenia.  EKG shows nsr @ 79 bpm.  No changes.  Feels better with ivf.  After long discussion with patient, he would like to go home as he does not want to pick up a nosocomial infection.  I see nothing emergent and agree this is in his best interest.  He would like something for the pain in his hip.        Geoffery Lyons, MD 04/23/11 1610  Geoffery Lyons, MD 05/13/11 1409

## 2011-04-26 ENCOUNTER — Encounter (HOSPITAL_COMMUNITY): Payer: Medicaid Other | Attending: Oncology

## 2011-04-26 ENCOUNTER — Telehealth (HOSPITAL_COMMUNITY): Payer: Self-pay | Admitting: Oncology

## 2011-04-26 ENCOUNTER — Telehealth (HOSPITAL_COMMUNITY): Payer: Self-pay

## 2011-04-26 ENCOUNTER — Other Ambulatory Visit (HOSPITAL_COMMUNITY): Payer: Self-pay | Admitting: Oncology

## 2011-04-26 DIAGNOSIS — C8589 Other specified types of non-Hodgkin lymphoma, extranodal and solid organ sites: Secondary | ICD-10-CM | POA: Insufficient documentation

## 2011-04-26 DIAGNOSIS — F411 Generalized anxiety disorder: Secondary | ICD-10-CM | POA: Insufficient documentation

## 2011-04-26 DIAGNOSIS — C8590 Non-Hodgkin lymphoma, unspecified, unspecified site: Secondary | ICD-10-CM

## 2011-04-26 DIAGNOSIS — C8330 Diffuse large B-cell lymphoma, unspecified site: Secondary | ICD-10-CM

## 2011-04-26 LAB — DIFFERENTIAL

## 2011-04-26 LAB — CBC
MCH: 30.9 pg (ref 26.0–34.0)
MCV: 90.7 fL (ref 78.0–100.0)
Platelets: 66 10*3/uL — ABNORMAL LOW (ref 150–400)
RBC: 3.24 MIL/uL — ABNORMAL LOW (ref 4.22–5.81)
RDW: 16 % — ABNORMAL HIGH (ref 11.5–15.5)

## 2011-04-26 MED ORDER — MAGNESIUM HYDROXIDE 400 MG/5ML PO SUSP: 30.0000 mL | Freq: Every day | ORAL | Status: DC | PRN

## 2011-04-26 MED ORDER — SENNOSIDES-DOCUSATE SODIUM 8.6-50 MG PO TABS: 2.0000 | ORAL_TABLET | Freq: Every day | ORAL | Status: AC | PRN

## 2011-04-26 MED ORDER — HYDROCODONE-ACETAMINOPHEN 5-500 MG PO TABS: 1.0000 | ORAL_TABLET | Freq: Four times a day (QID) | ORAL | Status: AC | PRN

## 2011-04-26 NOTE — Progress Notes (Unsigned)
Note sent to Dr. Carlena Bjornstad re: pts wbc of 0.6.  Dr Mariel Sleet instructed to send all labs to [facility] where pt is being seen. Artelia Laroche notified and stated she would take care of it

## 2011-04-26 NOTE — Telephone Encounter (Signed)
Patient called needing refill of pain medication.  The patient reports he is been having some hip pain which he relates is due to facet problems of hip.  He had Vicodin at home which she ran out of. He was prescribed by another physician. I refilled his prescription #30 with no refills.  Patient complains of constipation.  Last BM was 4 days ago.  Has not taken anything for this.  Prescribed MOM and Sennakot-S for him.  He will call tomorrow if no BM.

## 2011-04-26 NOTE — Telephone Encounter (Signed)
WBC 0.6

## 2011-04-27 ENCOUNTER — Other Ambulatory Visit (HOSPITAL_COMMUNITY): Payer: Self-pay | Admitting: Oncology

## 2011-04-27 DIAGNOSIS — C8330 Diffuse large B-cell lymphoma, unspecified site: Secondary | ICD-10-CM

## 2011-04-28 LAB — CULTURE, BLOOD (ROUTINE X 2): Culture: NO GROWTH

## 2011-04-29 ENCOUNTER — Other Ambulatory Visit (HOSPITAL_COMMUNITY): Payer: Medicaid Other

## 2011-04-29 ENCOUNTER — Other Ambulatory Visit (HOSPITAL_COMMUNITY): Payer: Self-pay | Admitting: Oncology

## 2011-04-29 DIAGNOSIS — C8330 Diffuse large B-cell lymphoma, unspecified site: Secondary | ICD-10-CM

## 2011-04-30 ENCOUNTER — Other Ambulatory Visit (HOSPITAL_COMMUNITY): Payer: Medicaid Other

## 2011-05-03 ENCOUNTER — Encounter (HOSPITAL_COMMUNITY): Payer: Medicaid Other

## 2011-05-03 DIAGNOSIS — C8330 Diffuse large B-cell lymphoma, unspecified site: Secondary | ICD-10-CM

## 2011-05-03 LAB — CBC
Hemoglobin: 10.9 g/dL — ABNORMAL LOW (ref 13.0–17.0)
MCH: 30.5 pg (ref 26.0–34.0)
MCHC: 32.4 g/dL (ref 30.0–36.0)

## 2011-05-03 LAB — DIFFERENTIAL
Basophils Absolute: 0 10*3/uL (ref 0.0–0.1)
Basophils Relative: 0 % (ref 0–1)
Eosinophils Absolute: 0 10*3/uL (ref 0.0–0.7)
Eosinophils Relative: 1 % (ref 0–5)
Monocytes Absolute: 0.6 10*3/uL (ref 0.1–1.0)
Monocytes Relative: 7 % (ref 3–12)
Neutrophils Relative %: 84 % — ABNORMAL HIGH (ref 43–77)

## 2011-05-03 NOTE — Progress Notes (Signed)
Labs drawn today for cbc/diff 

## 2011-05-13 ENCOUNTER — Encounter (HOSPITAL_BASED_OUTPATIENT_CLINIC_OR_DEPARTMENT_OTHER): Payer: Medicaid Other

## 2011-05-13 ENCOUNTER — Encounter (HOSPITAL_COMMUNITY): Payer: Medicaid Other

## 2011-05-13 VITALS — BP 99/64 | HR 115 | Temp 97.8°F

## 2011-05-13 DIAGNOSIS — B20 Human immunodeficiency virus [HIV] disease: Secondary | ICD-10-CM

## 2011-05-13 DIAGNOSIS — F419 Anxiety disorder, unspecified: Secondary | ICD-10-CM

## 2011-05-13 DIAGNOSIS — C8589 Other specified types of non-Hodgkin lymphoma, extranodal and solid organ sites: Secondary | ICD-10-CM

## 2011-05-13 DIAGNOSIS — C8330 Diffuse large B-cell lymphoma, unspecified site: Secondary | ICD-10-CM

## 2011-05-13 LAB — DIFFERENTIAL
Eosinophils Relative: 0 % (ref 0–5)
Lymphocytes Relative: 8 % — ABNORMAL LOW (ref 12–46)
Lymphs Abs: 0.7 10*3/uL (ref 0.7–4.0)
Monocytes Absolute: 0.1 10*3/uL (ref 0.1–1.0)
Monocytes Relative: 1 % — ABNORMAL LOW (ref 3–12)
Neutro Abs: 7.9 10*3/uL — ABNORMAL HIGH (ref 1.7–7.7)

## 2011-05-13 LAB — CBC
HCT: 36.1 % — ABNORMAL LOW (ref 39.0–52.0)
Hemoglobin: 11.8 g/dL — ABNORMAL LOW (ref 13.0–17.0)
MCV: 93.3 fL (ref 78.0–100.0)
WBC: 8.6 10*3/uL (ref 4.0–10.5)

## 2011-05-13 MED ORDER — PEGFILGRASTIM INJECTION 6 MG/0.6ML
SUBCUTANEOUS | Status: AC
  Administered 2011-05-13: 6 mg via SUBCUTANEOUS
  Filled 2011-05-13: qty 0.6

## 2011-05-13 MED ORDER — OLANZAPINE 2.5 MG PO TABS: 2.5000 mg | ORAL_TABLET | Freq: Three times a day (TID) | ORAL | Status: DC | PRN

## 2011-05-13 MED ORDER — PEGFILGRASTIM INJECTION 6 MG/0.6ML: 6.0000 mg | Freq: Once | SUBCUTANEOUS | Status: DC

## 2011-05-13 NOTE — Progress Notes (Signed)
Labs drawn today for cbc,diff 

## 2011-05-13 NOTE — Progress Notes (Signed)
Earlie Raveling presents today for injection per MD orders. Neulasta 6mg  administered SQ in right buttock Administration without incident. Patient tolerated well.

## 2011-05-14 ENCOUNTER — Other Ambulatory Visit (HOSPITAL_COMMUNITY): Payer: Self-pay | Admitting: Oncology

## 2011-05-14 DIAGNOSIS — R52 Pain, unspecified: Secondary | ICD-10-CM

## 2011-05-14 DIAGNOSIS — C8330 Diffuse large B-cell lymphoma, unspecified site: Secondary | ICD-10-CM

## 2011-05-14 MED ORDER — HYDROCODONE-ACETAMINOPHEN 5-325 MG PO TABS: 1.0000 | ORAL_TABLET | Freq: Four times a day (QID) | ORAL | Status: AC | PRN

## 2011-05-18 ENCOUNTER — Encounter (HOSPITAL_BASED_OUTPATIENT_CLINIC_OR_DEPARTMENT_OTHER): Payer: Medicaid Other

## 2011-05-18 DIAGNOSIS — C8589 Other specified types of non-Hodgkin lymphoma, extranodal and solid organ sites: Secondary | ICD-10-CM

## 2011-05-18 DIAGNOSIS — C8590 Non-Hodgkin lymphoma, unspecified, unspecified site: Secondary | ICD-10-CM

## 2011-05-18 DIAGNOSIS — B20 Human immunodeficiency virus [HIV] disease: Secondary | ICD-10-CM

## 2011-05-18 DIAGNOSIS — B2790 Infectious mononucleosis, unspecified without complication: Secondary | ICD-10-CM

## 2011-05-18 LAB — CBC
HCT: 34.7 % — ABNORMAL LOW (ref 39.0–52.0)
Hemoglobin: 11.4 g/dL — ABNORMAL LOW (ref 13.0–17.0)
MCH: 30.5 pg (ref 26.0–34.0)
MCHC: 32.9 g/dL (ref 30.0–36.0)
MCV: 92.8 fL (ref 78.0–100.0)
RDW: 17.6 % — ABNORMAL HIGH (ref 11.5–15.5)

## 2011-05-18 LAB — DIFFERENTIAL
Basophils Relative: 4 % — ABNORMAL HIGH (ref 0–1)
Eosinophils Relative: 4 % (ref 0–5)
Monocytes Absolute: 0.2 10*3/uL (ref 0.1–1.0)
Monocytes Relative: 23 % — ABNORMAL HIGH (ref 3–12)
Neutro Abs: 0.2 10*3/uL — ABNORMAL LOW (ref 1.7–7.7)

## 2011-05-18 NOTE — Progress Notes (Signed)
Labs drawn today for cbc/diff 

## 2011-05-20 ENCOUNTER — Other Ambulatory Visit (HOSPITAL_COMMUNITY): Payer: Medicaid Other

## 2011-05-24 ENCOUNTER — Encounter (HOSPITAL_COMMUNITY): Payer: Medicaid Other

## 2011-05-24 LAB — DIFFERENTIAL
Basophils Absolute: 0.1 10*3/uL (ref 0.0–0.1)
Basophils Relative: 1 % (ref 0–1)
Eosinophils Absolute: 0 10*3/uL (ref 0.0–0.7)
Monocytes Absolute: 0.7 10*3/uL (ref 0.1–1.0)
Monocytes Relative: 12 % (ref 3–12)
Neutrophils Relative %: 75 % (ref 43–77)

## 2011-05-24 LAB — CBC
Hemoglobin: 12.3 g/dL — ABNORMAL LOW (ref 13.0–17.0)
MCH: 30.6 pg (ref 26.0–34.0)
MCHC: 32.9 g/dL (ref 30.0–36.0)
RDW: 18.8 % — ABNORMAL HIGH (ref 11.5–15.5)

## 2011-05-24 NOTE — Progress Notes (Signed)
Labs drawn today for cbc,diff 

## 2011-05-27 ENCOUNTER — Encounter (HOSPITAL_COMMUNITY): Payer: Medicaid Other | Attending: Oncology

## 2011-05-27 DIAGNOSIS — C8589 Other specified types of non-Hodgkin lymphoma, extranodal and solid organ sites: Secondary | ICD-10-CM | POA: Insufficient documentation

## 2011-05-27 DIAGNOSIS — F411 Generalized anxiety disorder: Secondary | ICD-10-CM | POA: Insufficient documentation

## 2011-05-27 LAB — DIFFERENTIAL
Eosinophils Relative: 0 % (ref 0–5)
Lymphocytes Relative: 10 % — ABNORMAL LOW (ref 12–46)
Lymphs Abs: 0.7 10*3/uL (ref 0.7–4.0)
Monocytes Relative: 10 % (ref 3–12)
Neutrophils Relative %: 80 % — ABNORMAL HIGH (ref 43–77)

## 2011-05-27 LAB — CBC
Hemoglobin: 12.5 g/dL — ABNORMAL LOW (ref 13.0–17.0)
MCV: 93.8 fL (ref 78.0–100.0)
Platelets: 157 10*3/uL (ref 150–400)
RBC: 4.17 MIL/uL — ABNORMAL LOW (ref 4.22–5.81)
WBC: 7.1 10*3/uL (ref 4.0–10.5)

## 2011-05-27 NOTE — Progress Notes (Signed)
Patient had a cbc/diff drawn today

## 2011-05-31 ENCOUNTER — Telehealth (HOSPITAL_COMMUNITY): Payer: Self-pay

## 2011-05-31 ENCOUNTER — Encounter (HOSPITAL_COMMUNITY): Payer: Medicaid Other

## 2011-05-31 LAB — DIFFERENTIAL
Eosinophils Absolute: 0 10*3/uL (ref 0.0–0.7)
Eosinophils Relative: 0 % (ref 0–5)
Lymphocytes Relative: 10 % — ABNORMAL LOW (ref 12–46)
Lymphs Abs: 0.7 10*3/uL (ref 0.7–4.0)
Monocytes Absolute: 0.7 10*3/uL (ref 0.1–1.0)

## 2011-05-31 LAB — CBC
HCT: 40 % (ref 39.0–52.0)
MCH: 30.2 pg (ref 26.0–34.0)
MCV: 93 fL (ref 78.0–100.0)
RBC: 4.3 MIL/uL (ref 4.22–5.81)
WBC: 7.4 10*3/uL (ref 4.0–10.5)

## 2011-05-31 NOTE — Telephone Encounter (Signed)
Lab results faxed to [facility] [phone])

## 2011-05-31 NOTE — Progress Notes (Signed)
Labs drawn today for cbc/diff 

## 2011-06-10 ENCOUNTER — Encounter (HOSPITAL_COMMUNITY): Payer: Medicaid Other

## 2011-06-10 MED ORDER — HEPARIN SOD (PORK) LOCK FLUSH 100 UNIT/ML IV SOLN
INTRAVENOUS | Status: AC
  Filled 2011-06-10: qty 5

## 2011-06-10 MED ORDER — SODIUM CHLORIDE 0.9 % IJ SOLN
INTRAMUSCULAR | Status: AC
  Filled 2011-06-10: qty 10

## 2011-06-10 NOTE — Progress Notes (Signed)
Earlie Raveling presented for Portacath access and flush. Proper placement of portacath confirmed by CXR. Portacath located right chest wall, patient has double port, accessed with two  H 20 needles. Good blood return present in left side port, no blood return in right side of port.   Portacath flushed with 20ml NS and 500U/60ml Heparin in both sides and needles removed intact. Procedure without incident. Patient tolerated procedure well.

## 2011-07-06 ENCOUNTER — Encounter (HOSPITAL_COMMUNITY): Payer: Medicaid Other

## 2011-07-22 ENCOUNTER — Encounter (HOSPITAL_COMMUNITY): Payer: Medicaid Other

## 2011-07-29 ENCOUNTER — Encounter (HOSPITAL_COMMUNITY): Payer: Medicaid Other | Attending: Oncology

## 2011-07-29 DIAGNOSIS — C8589 Other specified types of non-Hodgkin lymphoma, extranodal and solid organ sites: Secondary | ICD-10-CM | POA: Insufficient documentation

## 2011-07-29 MED ORDER — SODIUM CHLORIDE 0.9 % IJ SOLN
INTRAMUSCULAR | Status: AC
  Filled 2011-07-29: qty 20

## 2011-07-29 MED ORDER — SODIUM CHLORIDE 0.9 % IJ SOLN
10.0000 mL | INTRAMUSCULAR | Status: DC | PRN
  Filled 2011-07-29: qty 10

## 2011-07-29 MED ORDER — HEPARIN SOD (PORK) LOCK FLUSH 100 UNIT/ML IV SOLN
INTRAVENOUS | Status: AC
  Filled 2011-07-29: qty 10

## 2011-07-29 MED ORDER — HEPARIN SOD (PORK) LOCK FLUSH 100 UNIT/ML IV SOLN
500.0000 [IU] | Freq: Once | INTRAVENOUS | Status: DC
  Filled 2011-07-29: qty 5

## 2011-07-29 NOTE — Progress Notes (Signed)
Troy Holden presented for Portacath access and flush. Proper placement of portacath confirmed by CXR. Double Portacath located right chest wall accessed with two H 20 needles. No blood return but flushed well, no pain with flushing.   Portacath flushed with 20ml NS and 500U/76ml Heparin on each side and needles removed intact. Procedure without incident. Patient tolerated procedure well.

## 2011-08-24 ENCOUNTER — Encounter (HOSPITAL_BASED_OUTPATIENT_CLINIC_OR_DEPARTMENT_OTHER): Payer: Medicaid Other

## 2011-08-24 DIAGNOSIS — Z452 Encounter for adjustment and management of vascular access device: Secondary | ICD-10-CM

## 2011-08-24 DIAGNOSIS — C8330 Diffuse large B-cell lymphoma, unspecified site: Secondary | ICD-10-CM

## 2011-08-24 DIAGNOSIS — C8589 Other specified types of non-Hodgkin lymphoma, extranodal and solid organ sites: Secondary | ICD-10-CM

## 2011-08-24 DIAGNOSIS — B20 Human immunodeficiency virus [HIV] disease: Secondary | ICD-10-CM

## 2011-08-24 MED ORDER — SODIUM CHLORIDE 0.9 % IJ SOLN
INTRAMUSCULAR | Status: AC
  Administered 2011-08-24: 10 mL via INTRAVENOUS
  Filled 2011-08-24: qty 10

## 2011-08-24 MED ORDER — SODIUM CHLORIDE 0.9 % IJ SOLN
10.0000 mL | INTRAMUSCULAR | Status: DC | PRN
  Administered 2011-08-24: 10 mL via INTRAVENOUS
  Filled 2011-08-24: qty 10

## 2011-08-24 MED ORDER — HEPARIN SOD (PORK) LOCK FLUSH 100 UNIT/ML IV SOLN
500.0000 [IU] | Freq: Once | INTRAVENOUS | Status: AC
  Administered 2011-08-24: 500 [IU] via INTRAVENOUS
  Filled 2011-08-24: qty 5

## 2011-08-24 MED ORDER — HEPARIN SOD (PORK) LOCK FLUSH 100 UNIT/ML IV SOLN
INTRAVENOUS | Status: AC
  Administered 2011-08-24: 500 [IU] via INTRAVENOUS
  Filled 2011-08-24: qty 5

## 2011-08-24 NOTE — Progress Notes (Signed)
Pt has double port. Each lumen flushed with ns 20 ml and heparin 500 units.Tolerated well.

## 2011-09-13 ENCOUNTER — Emergency Department (HOSPITAL_COMMUNITY)
Admission: EM | Admit: 2011-09-13 | Discharge: 2011-09-13 | Disposition: A | Discharge status: Hospice | Payer: Medicaid Other | Attending: Emergency Medicine | Admitting: Emergency Medicine

## 2011-09-13 ENCOUNTER — Emergency Department (HOSPITAL_COMMUNITY): Payer: Medicaid Other

## 2011-09-13 ENCOUNTER — Encounter (HOSPITAL_COMMUNITY): Payer: Self-pay | Admitting: Emergency Medicine

## 2011-09-13 DIAGNOSIS — I2510 Atherosclerotic heart disease of native coronary artery without angina pectoris: Secondary | ICD-10-CM | POA: Insufficient documentation

## 2011-09-13 DIAGNOSIS — E785 Hyperlipidemia, unspecified: Secondary | ICD-10-CM | POA: Insufficient documentation

## 2011-09-13 DIAGNOSIS — S161XXA Strain of muscle, fascia and tendon at neck level, initial encounter: Secondary | ICD-10-CM

## 2011-09-13 DIAGNOSIS — F411 Generalized anxiety disorder: Secondary | ICD-10-CM | POA: Insufficient documentation

## 2011-09-13 DIAGNOSIS — I519 Heart disease, unspecified: Secondary | ICD-10-CM | POA: Insufficient documentation

## 2011-09-13 DIAGNOSIS — F3289 Other specified depressive episodes: Secondary | ICD-10-CM | POA: Insufficient documentation

## 2011-09-13 DIAGNOSIS — E039 Hypothyroidism, unspecified: Secondary | ICD-10-CM | POA: Insufficient documentation

## 2011-09-13 DIAGNOSIS — K219 Gastro-esophageal reflux disease without esophagitis: Secondary | ICD-10-CM | POA: Insufficient documentation

## 2011-09-13 DIAGNOSIS — F0390 Unspecified dementia without behavioral disturbance: Secondary | ICD-10-CM | POA: Insufficient documentation

## 2011-09-13 DIAGNOSIS — Z79899 Other long term (current) drug therapy: Secondary | ICD-10-CM | POA: Insufficient documentation

## 2011-09-13 DIAGNOSIS — C8589 Other specified types of non-Hodgkin lymphoma, extranodal and solid organ sites: Secondary | ICD-10-CM | POA: Insufficient documentation

## 2011-09-13 DIAGNOSIS — M542 Cervicalgia: Secondary | ICD-10-CM | POA: Insufficient documentation

## 2011-09-13 DIAGNOSIS — R51 Headache: Secondary | ICD-10-CM | POA: Insufficient documentation

## 2011-09-13 DIAGNOSIS — IMO0001 Reserved for inherently not codable concepts without codable children: Secondary | ICD-10-CM | POA: Insufficient documentation

## 2011-09-13 DIAGNOSIS — F329 Major depressive disorder, single episode, unspecified: Secondary | ICD-10-CM | POA: Insufficient documentation

## 2011-09-13 DIAGNOSIS — Z21 Asymptomatic human immunodeficiency virus [HIV] infection status: Secondary | ICD-10-CM | POA: Insufficient documentation

## 2011-09-13 MED ORDER — HYDROCODONE-ACETAMINOPHEN 5-325 MG PO TABS: ORAL_TABLET | ORAL | Status: AC

## 2011-09-13 MED ORDER — METHOCARBAMOL 500 MG PO TABS: ORAL_TABLET | ORAL | Status: DC
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
## 2011-09-13 NOTE — Discharge Instructions (Signed)
Cervical Strain A cervical strain is when the muscles or ligaments in the neck have been stretched. HOME CARE   Wear your neck collar as told. Do not remove any collar unless your doctor says it is okay.   Ask your doctor if you can remove the neck collar for:   Bathing.   Applying ice.   Put ice on the injured area.   Put ice in a plastic bag.   Place a towel between your skin and the bag.   Leave the ice on for 15 to 20 minutes, 3 to 4 times a day.   Do this for the first 2 days, or as told.   Only take medicine as told by your doctor.  Finding out the results of your test Ask when your test results will be ready. Make sure you get your test results. GET HELP RIGHT AWAY IF:   You have problems swallowing.   You have trouble breathing.   You feel numb.   You have weakness or problems moving your arms or legs.   The pain is increasing and does not get better with medicine.  MAKE SURE YOU:   Understand these instructions.   Will watch this condition.   Will get help right away if you or your child is not doing well or gets worse.  Document Released: 12/29/2007 Document Revised: 03/24/2011 Document Reviewed: 12/29/2007 [facility] Patient Information 2012 [place], [place].
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
## 2011-09-16 ENCOUNTER — Encounter (HOSPITAL_COMMUNITY): Payer: Self-pay | Admitting: *Deleted

## 2011-09-16 ENCOUNTER — Emergency Department (HOSPITAL_COMMUNITY)
Admission: EM | Admit: 2011-09-16 | Discharge: 2011-09-16 | Disposition: A | Discharge status: Home / Self Care | Payer: Medicaid Other | Attending: Emergency Medicine | Admitting: Emergency Medicine

## 2011-09-16 DIAGNOSIS — Z21 Asymptomatic human immunodeficiency virus [HIV] infection status: Secondary | ICD-10-CM | POA: Insufficient documentation

## 2011-09-16 DIAGNOSIS — R51 Headache: Secondary | ICD-10-CM

## 2011-09-16 DIAGNOSIS — I2510 Atherosclerotic heart disease of native coronary artery without angina pectoris: Secondary | ICD-10-CM | POA: Insufficient documentation

## 2011-09-16 DIAGNOSIS — E039 Hypothyroidism, unspecified: Secondary | ICD-10-CM | POA: Insufficient documentation

## 2011-09-16 DIAGNOSIS — E785 Hyperlipidemia, unspecified: Secondary | ICD-10-CM | POA: Insufficient documentation

## 2011-09-16 DIAGNOSIS — K219 Gastro-esophageal reflux disease without esophagitis: Secondary | ICD-10-CM | POA: Insufficient documentation

## 2011-09-16 DIAGNOSIS — Z79899 Other long term (current) drug therapy: Secondary | ICD-10-CM | POA: Insufficient documentation

## 2011-09-16 MED ORDER — ONDANSETRON 4 MG PO TBDP
4.0000 mg | ORAL_TABLET | Freq: Once | ORAL | Status: DC
  Filled 2011-09-16: qty 1

## 2011-09-16 MED ORDER — DIPHENHYDRAMINE HCL 12.5 MG/5ML PO ELIX
12.5000 mg | ORAL_SOLUTION | Freq: Once | ORAL | Status: AC
  Administered 2011-09-16: 12.5 mg via ORAL
  Filled 2011-09-16: qty 5

## 2011-09-16 MED ORDER — PROMETHAZINE HCL 12.5 MG PO TABS
12.5000 mg | ORAL_TABLET | Freq: Once | ORAL | Status: AC
  Administered 2011-09-16: 12.5 mg via ORAL
  Filled 2011-09-16: qty 1

## 2011-09-16 MED ORDER — FENTANYL CITRATE 0.05 MG/ML IJ SOLN
100.0000 ug | Freq: Once | INTRAMUSCULAR | Status: AC
  Administered 2011-09-16: 100 ug via INTRAMUSCULAR
  Filled 2011-09-16: qty 2

## 2011-09-16 NOTE — Discharge Instructions (Signed)
I reviewed your chart from your previous emergency department visits including your CT scans. Your examination today does not reveal any acute neurologic changes. You were treated today with an intramuscular narcotic medication. Please use caution getting around. Please have your assisted living staff arrange an appointment with your primary physician if continued pain management is needed before you see the specialist.
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
## 2011-09-16 NOTE — ED Provider Notes (Signed)
Medical screening examination/treatment/procedure(s) were performed by non-physician practitioner and as supervising physician I was immediately available for consultation/collaboration.  Kiernan Farkas P Dazani Norby, MD 09/16/11 0711 
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
## 2011-09-16 NOTE — ED Provider Notes (Signed)
Medical screening examination/treatment/procedure(s) were performed by non-physician practitioner and as supervising physician I was immediately available for consultation/collaboration.   Joya Gaskins, MD 09/19/11 2003

## 2011-09-23 ENCOUNTER — Ambulatory Visit: Payer: Medicaid Other | Admitting: Orthopedic Surgery

## 2011-09-29 ENCOUNTER — Emergency Department (HOSPITAL_COMMUNITY): Payer: Medicaid Other

## 2011-09-29 ENCOUNTER — Encounter (HOSPITAL_COMMUNITY): Payer: Self-pay | Admitting: *Deleted

## 2011-09-29 ENCOUNTER — Inpatient Hospital Stay (HOSPITAL_COMMUNITY)
Admission: EM | Admit: 2011-09-29 | Discharge: 2011-10-08 | DRG: 974 | Disposition: A | Discharge status: Skilled Nursing Facility | Payer: Medicaid Other | Attending: Internal Medicine | Admitting: Internal Medicine

## 2011-09-29 ENCOUNTER — Other Ambulatory Visit: Payer: Self-pay

## 2011-09-29 DIAGNOSIS — E870 Hyperosmolality and hypernatremia: Secondary | ICD-10-CM | POA: Diagnosis present

## 2011-09-29 DIAGNOSIS — G928 Other toxic encephalopathy: Secondary | ICD-10-CM | POA: Diagnosis present

## 2011-09-29 DIAGNOSIS — A419 Sepsis, unspecified organism: Secondary | ICD-10-CM | POA: Diagnosis present

## 2011-09-29 DIAGNOSIS — E872 Acidosis, unspecified: Secondary | ICD-10-CM | POA: Diagnosis present

## 2011-09-29 DIAGNOSIS — G92 Toxic encephalopathy: Secondary | ICD-10-CM | POA: Diagnosis present

## 2011-09-29 DIAGNOSIS — J329 Chronic sinusitis, unspecified: Secondary | ICD-10-CM | POA: Diagnosis present

## 2011-09-29 DIAGNOSIS — B20 Human immunodeficiency virus [HIV] disease: Principal | ICD-10-CM | POA: Diagnosis present

## 2011-09-29 DIAGNOSIS — E871 Hypo-osmolality and hyponatremia: Secondary | ICD-10-CM | POA: Diagnosis present

## 2011-09-29 DIAGNOSIS — F0390 Unspecified dementia without behavioral disturbance: Secondary | ICD-10-CM | POA: Diagnosis present

## 2011-09-29 DIAGNOSIS — R7989 Other specified abnormal findings of blood chemistry: Secondary | ICD-10-CM | POA: Diagnosis present

## 2011-09-29 DIAGNOSIS — E039 Hypothyroidism, unspecified: Secondary | ICD-10-CM | POA: Diagnosis present

## 2011-09-29 DIAGNOSIS — F17200 Nicotine dependence, unspecified, uncomplicated: Secondary | ICD-10-CM | POA: Diagnosis present

## 2011-09-29 DIAGNOSIS — N179 Acute kidney failure, unspecified: Secondary | ICD-10-CM | POA: Diagnosis present

## 2011-09-29 DIAGNOSIS — C8330 Diffuse large B-cell lymphoma, unspecified site: Secondary | ICD-10-CM | POA: Diagnosis present

## 2011-09-29 DIAGNOSIS — J9600 Acute respiratory failure, unspecified whether with hypoxia or hypercapnia: Secondary | ICD-10-CM | POA: Diagnosis present

## 2011-09-29 DIAGNOSIS — I2510 Atherosclerotic heart disease of native coronary artery without angina pectoris: Secondary | ICD-10-CM | POA: Diagnosis present

## 2011-09-29 DIAGNOSIS — G929 Unspecified toxic encephalopathy: Secondary | ICD-10-CM | POA: Diagnosis present

## 2011-09-29 DIAGNOSIS — F0280 Dementia in other diseases classified elsewhere without behavioral disturbance: Secondary | ICD-10-CM | POA: Diagnosis present

## 2011-09-29 DIAGNOSIS — N170 Acute kidney failure with tubular necrosis: Secondary | ICD-10-CM | POA: Diagnosis present

## 2011-09-29 DIAGNOSIS — C8589 Other specified types of non-Hodgkin lymphoma, extranodal and solid organ sites: Secondary | ICD-10-CM | POA: Diagnosis present

## 2011-09-29 DIAGNOSIS — K7200 Acute and subacute hepatic failure without coma: Secondary | ICD-10-CM | POA: Diagnosis present

## 2011-09-29 DIAGNOSIS — R4182 Altered mental status, unspecified: Secondary | ICD-10-CM

## 2011-09-29 DIAGNOSIS — K759 Inflammatory liver disease, unspecified: Secondary | ICD-10-CM | POA: Diagnosis present

## 2011-09-29 DIAGNOSIS — E785 Hyperlipidemia, unspecified: Secondary | ICD-10-CM | POA: Diagnosis present

## 2011-09-29 DIAGNOSIS — Z21 Asymptomatic human immunodeficiency virus [HIV] infection status: Secondary | ICD-10-CM | POA: Diagnosis present

## 2011-09-29 DIAGNOSIS — E876 Hypokalemia: Secondary | ICD-10-CM | POA: Diagnosis present

## 2011-09-29 DIAGNOSIS — C8590 Non-Hodgkin lymphoma, unspecified, unspecified site: Secondary | ICD-10-CM

## 2011-09-29 LAB — URINALYSIS, ROUTINE W REFLEX MICROSCOPIC
Ketones, ur: NEGATIVE mg/dL
Leukocytes, UA: NEGATIVE
Nitrite: NEGATIVE
Protein, ur: NEGATIVE mg/dL

## 2011-09-29 LAB — COMPREHENSIVE METABOLIC PANEL
ALT: 107 U/L — ABNORMAL HIGH (ref 0–53)
BUN: 13 mg/dL (ref 6–23)
CO2: 18 mEq/L — ABNORMAL LOW (ref 19–32)
Calcium: 9.2 mg/dL (ref 8.4–10.5)
GFR calc Af Amer: >90 mL/min (ref >90)
GFR calc non Af Amer: >90 mL/min (ref >90)
Glucose, Bld: 161 mg/dL — ABNORMAL HIGH (ref 70–99)
Sodium: 128 mEq/L — ABNORMAL LOW (ref 135–145)
Total Protein: 7.7 g/dL (ref 6.0–8.3)

## 2011-09-29 LAB — CARDIAC PANEL(CRET KIN+CKTOT+MB+TROPI)
CK, MB: 14.3 ng/mL (ref 0.3–4.0)
Relative Index: 1.3 (ref 0.0–2.5)
Total CK: 1071 U/L — ABNORMAL HIGH (ref 7–232)
Troponin I: <0.3 ng/mL (ref <0.30)

## 2011-09-29 LAB — ETHANOL: Alcohol, Ethyl (B): <11 mg/dL (ref 0–11)

## 2011-09-29 LAB — CBC
Hemoglobin: 17.2 g/dL — ABNORMAL HIGH (ref 13.0–17.0)
MCHC: 36 g/dL (ref 30.0–36.0)
RDW: 15.1 % (ref 11.5–15.5)
WBC: 10.9 10*3/uL — ABNORMAL HIGH (ref 4.0–10.5)

## 2011-09-29 LAB — BLOOD GAS, ARTERIAL
Drawn by: 23534
O2 Content: 3 L/min
Patient temperature: 37
TCO2: 14.5 mmol/L (ref 0–100)
pH, Arterial: 7.379 (ref 7.350–7.450)

## 2011-09-29 LAB — TROPONIN I: Troponin I: <0.3 ng/mL (ref <0.30)

## 2011-09-29 LAB — TSH: TSH: 1.463 u[IU]/mL (ref 0.350–4.500)

## 2011-09-29 LAB — PROCALCITONIN: Procalcitonin: 16.39 ng/mL

## 2011-09-29 LAB — MRSA PCR SCREENING: MRSA by PCR: NEGATIVE

## 2011-09-29 LAB — LIPASE, BLOOD: Lipase: 21 U/L (ref 11–59)

## 2011-09-29 LAB — RAPID URINE DRUG SCREEN, HOSP PERFORMED: Opiates: POSITIVE — AB

## 2011-09-29 MED ORDER — DEXTROSE 5 % IV SOLN
INTRAVENOUS | Status: AC
  Filled 2011-09-29: qty 2

## 2011-09-29 MED ORDER — VANCOMYCIN HCL IN DEXTROSE 1-5 GM/200ML-% IV SOLN
INTRAVENOUS | Status: AC
  Filled 2011-09-29: qty 200

## 2011-09-29 MED ORDER — VANCOMYCIN HCL IN DEXTROSE 1-5 GM/200ML-% IV SOLN
1000.0000 mg | Freq: Three times a day (TID) | INTRAVENOUS | Status: DC
  Administered 2011-09-29 – 2011-10-01 (×4): 1000 mg via INTRAVENOUS
  Filled 2011-09-29 (×7): qty 200

## 2011-09-29 MED ORDER — CHLORHEXIDINE GLUCONATE 0.12 % MT SOLN
15.0000 mL | Freq: Two times a day (BID) | OROMUCOSAL | Status: DC
  Administered 2011-09-29 – 2011-10-07 (×15): 15 mL via OROMUCOSAL
  Filled 2011-09-29 (×19): qty 15

## 2011-09-29 MED ORDER — SODIUM CHLORIDE 0.9 % IV BOLUS (SEPSIS)
1000.0000 mL | Freq: Once | INTRAVENOUS | Status: AC
  Administered 2011-09-29: 1000 mL via INTRAVENOUS

## 2011-09-29 MED ORDER — SODIUM CHLORIDE 0.9 % IV SOLN
INTRAVENOUS | Status: DC
  Administered 2011-09-29 – 2011-09-30 (×2): 100 mL via INTRAVENOUS

## 2011-09-29 MED ORDER — SODIUM CHLORIDE 0.9 % IV BOLUS (SEPSIS)
250.0000 mL | Freq: Once | INTRAVENOUS | Status: AC
  Administered 2011-09-29: 250 mL via INTRAVENOUS

## 2011-09-29 MED ORDER — SODIUM CHLORIDE 0.9 % IV SOLN
INTRAVENOUS | Status: DC
  Administered 2011-09-29: 18:00:00 via INTRAVENOUS

## 2011-09-29 MED ORDER — DEXTROSE 5 % IV SOLN
INTRAVENOUS | Status: DC
  Administered 2011-09-29: 19:00:00 via INTRAVENOUS
  Filled 2011-09-29: qty 600

## 2011-09-29 MED ORDER — BIOTENE DRY MOUTH MT LIQD
15.0000 mL | Freq: Four times a day (QID) | OROMUCOSAL | Status: DC
  Administered 2011-09-29 – 2011-10-06 (×25): 15 mL via OROMUCOSAL

## 2011-09-29 MED ORDER — ONDANSETRON HCL 4 MG PO TABS: 4.0000 mg | ORAL_TABLET | Freq: Four times a day (QID) | ORAL | Status: DC | PRN

## 2011-09-29 MED ORDER — ENOXAPARIN SODIUM 40 MG/0.4ML ~~LOC~~ SOLN
40.0000 mg | SUBCUTANEOUS | Status: DC
  Administered 2011-09-29 – 2011-10-01 (×3): 40 mg via SUBCUTANEOUS
  Filled 2011-09-29 (×5): qty 0.4

## 2011-09-29 MED ORDER — GADOBENATE DIMEGLUMINE 529 MG/ML IV SOLN
20.0000 mL | Freq: Once | INTRAVENOUS | Status: AC | PRN
  Administered 2011-09-29: 20 mL via INTRAVENOUS

## 2011-09-29 MED ORDER — PIPERACILLIN-TAZOBACTAM 3.375 G IVPB
3.3750 g | Freq: Once | INTRAVENOUS | Status: AC
  Administered 2011-09-29: 3.375 g via INTRAVENOUS
  Filled 2011-09-29: qty 50

## 2011-09-29 MED ORDER — NALOXONE HCL 1 MG/ML IJ SOLN
INTRAMUSCULAR | Status: AC
  Administered 2011-09-29: 2 mg
  Filled 2011-09-29: qty 2

## 2011-09-29 MED ORDER — ACYCLOVIR SODIUM 50 MG/ML IV SOLN
10.0000 mg/kg | Freq: Three times a day (TID) | INTRAVENOUS | Status: DC
  Administered 2011-09-29 – 2011-09-30 (×3): 905 mg via INTRAVENOUS
  Filled 2011-09-29 (×8): qty 18.1

## 2011-09-29 MED ORDER — LEVOTHYROXINE SODIUM 100 MCG IV SOLR
66.0000 ug | Freq: Every day | INTRAVENOUS | Status: DC
  Administered 2011-09-30 – 2011-10-02 (×3): 66 ug via INTRAVENOUS
  Filled 2011-09-29 (×5): qty 3.3

## 2011-09-29 MED ORDER — SODIUM CHLORIDE 0.9 % IV SOLN
3.0000 g | Freq: Four times a day (QID) | INTRAVENOUS | Status: DC
  Administered 2011-09-29 – 2011-09-30 (×5): 3 g via INTRAVENOUS
  Filled 2011-09-29 (×9): qty 3

## 2011-09-29 MED ORDER — DEXAMETHASONE SODIUM PHOSPHATE 10 MG/ML IJ SOLN
INTRAMUSCULAR | Status: AC
  Filled 2011-09-29: qty 2

## 2011-09-29 MED ORDER — SODIUM CHLORIDE 0.9 % IV SOLN
INTRAVENOUS | Status: AC
  Filled 2011-09-29 (×2): qty 3

## 2011-09-29 MED ORDER — SODIUM CHLORIDE 0.9 % IV SOLN: INTRAVENOUS | Status: AC

## 2011-09-29 MED ORDER — DEXAMETHASONE SODIUM PHOSPHATE 10 MG/ML IJ SOLN
13.5000 mg | Freq: Four times a day (QID) | INTRAMUSCULAR | Status: DC
  Administered 2011-09-29 – 2011-10-01 (×6): 14 mg via INTRAVENOUS
  Filled 2011-09-29 (×13): qty 1.4

## 2011-09-29 MED ORDER — DEXTROSE 5 % IV SOLN
2.0000 g | Freq: Two times a day (BID) | INTRAVENOUS | Status: DC
  Administered 2011-09-29 – 2011-10-01 (×4): 2 g via INTRAVENOUS
  Filled 2011-09-29 (×6): qty 2

## 2011-09-29 MED ORDER — SODIUM CHLORIDE 0.9 % IV SOLN
INTRAVENOUS | Status: AC
  Filled 2011-09-29: qty 3

## 2011-09-29 MED ORDER — SODIUM CHLORIDE 0.9 % IJ SOLN
3.0000 mL | Freq: Two times a day (BID) | INTRAMUSCULAR | Status: DC
  Administered 2011-09-29 – 2011-10-07 (×11): 3 mL via INTRAVENOUS
  Filled 2011-09-29 (×2): qty 3

## 2011-09-29 MED ORDER — NALOXONE HCL 1 MG/ML IJ SOLN
2.0000 mg | Freq: Once | INTRAMUSCULAR | Status: AC
  Administered 2011-09-29: 2 mg via INTRAVENOUS
  Filled 2011-09-29: qty 2

## 2011-09-29 MED ORDER — DEXAMETHASONE SODIUM PHOSPHATE 10 MG/ML IJ SOLN
INTRAMUSCULAR | Status: AC
  Filled 2011-09-29: qty 1

## 2011-09-29 MED ORDER — ONDANSETRON HCL 4 MG/2ML IJ SOLN
4.0000 mg | Freq: Four times a day (QID) | INTRAMUSCULAR | Status: DC | PRN
  Filled 2011-09-29: qty 2

## 2011-09-29 MED ORDER — VANCOMYCIN HCL IN DEXTROSE 1-5 GM/200ML-% IV SOLN
1000.0000 mg | Freq: Once | INTRAVENOUS | Status: AC
  Administered 2011-09-29: 1000 mg via INTRAVENOUS
  Filled 2011-09-29: qty 200

## 2011-09-29 NOTE — Progress Notes (Signed)
2305-Notified MD of SBP<90 and DBP<60. MD orders to follow.

## 2011-09-29 NOTE — Progress Notes (Signed)
Paged Dr. Orvan Falconer with critical lab CK-MB 14.3.  Awaiting return call.

## 2011-09-29 NOTE — Procedures (Signed)
Fluoroscopic guided lumbar puncture.  Technique: Informed consent could not be obtained secondary to patient clinical status.  By the the emergency department clinical staff, the procedure was deemed medically necessary. A complete time-out could not be performed.  The patient's name and wrist band were verified. Initially, the L3-L4 level was localized.  Skin was prepped and draped in a standard sterile fashion.  Skin and subcutaneous tissues were numbed with 1% lidocaine.  Despite optimal needle position, CSF return could not be obtained.   After three attempts at needle placement, attention was turned to the L2-L3 level..  Skin was prepped with 1% lidocaine.  The needle was again placed optimally.  Despite this, CSF return could not be obtained. Fluoroscopic time: [4] minutes Comparison: [MRI of same date.] Findings: Not applicable IMPRESSION: Unsuccessful attempt at fluoroscopic guided lumbar puncture, as detailed above.

## 2011-09-29 NOTE — ED Notes (Signed)
CBG Result = 174 
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
## 2011-09-29 NOTE — ED Notes (Signed)
Pt arrived from [facility] via ems d/t altered mental status. Pt was last seen normal last pm. Per staff pt normal status is up walking around and talking. Pt has arrived soiled and with emesis on shirt. Pt not answering questions pts eyes follow staff around room with occasional rolling back in head pt did not respond to rectal temp. 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

## 2011-09-29 NOTE — Progress Notes (Signed)
Called pt's brother Jaydan Meidinger at [phone]) and spoke with him regarding pt.  Christen Bame is the pt's brother and his legal guardian.  Pt's brother states that it is fine to release medical information to pt's parents Rayna Sexton and Lupita Leash) if they call requesting information on him.

## 2011-09-29 NOTE — Progress Notes (Signed)
ANTIBIOTIC CONSULT NOTE - INITIAL  Pharmacy Consult for Vancomycin and Unasyn Indication: meningitis  Allergies  Allergen Reactions  . Xylocaine Rash    Patient had to be rushed to hospital from dentist office after receiving xylocaine  . Trazodone And Nefazodone Other (See Comments)    Nightmares, hangover like feeling for 2-3 days    Patient Measurements: Height: 6\' 2"  (188 cm) Weight: 176 lb 12.9 oz (80.2 kg) IBW/kg (Calculated) : 82.2   Vital Signs: Temp: 98.4 F (36.9 C) (03/06 1030) Temp src: Rectal (03/06 1030) BP: 156/86 mmHg (03/06 1748) Pulse Rate: 81  (03/06 1600) Intake/Output from previous day:   Intake/Output from this shift: Total I/O In: -  Out: 2050 [Urine:2050]  Labs:  [facility] 09/29/11 1055  WBC 10.9*  HGB 17.2*  PLT 155  LABCREA --  CREATININE 0.62   Estimated Creatinine Clearance: 112.8 ml/min (by C-G formula based on Cr of 0.62). No results found for this basename: VANCOTROUGH:2,VANCOPEAK:2,VANCORANDOM:2,GENTTROUGH:2,GENTPEAK:2,GENTRANDOM:2,TOBRATROUGH:2,TOBRAPEAK:2,TOBRARND:2,AMIKACINPEAK:2,AMIKACINTROU:2,AMIKACIN:2, in the last 72 hours   Microbiology: No results found for this or any previous visit (from the past 720 hour(s)).  Medical History: Past Medical History  Diagnosis Date  . Heart disease   . Cancer     lymphoma  . HIV (human immunodeficiency virus infection)   . Anemia   . Anxiety   . Blood transfusion   . Cataract   . Depression   . GERD (gastroesophageal reflux disease)   . Thyroid disease     low thyroid  . 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 HIV (human immunodeficiency virus infection) 04/20/2011  . Dementia 04/20/2011  . CAD (coronary artery disease) 04/20/2011  . Hypothyroidism 04/20/2011  . Dyslipidemia 04/20/2011    Medications:  Scheduled:    . sodium chloride   Intravenous STAT  . acyclovir  10 mg/kg Intravenous Q8H  . ampicillin-sulbactam  (UNASYN) IV  3 g Intravenous Q6H  . cefTRIAXone (ROCEPHIN)  IV  2 g Intravenous Q12H  . dexamethasone  14 mg Intravenous Q6H  . dextrose 5 % 600 mL with azithromycin (ZITHROMAX) 1,200 mg infusion   Intravenous Q7 days  . enoxaparin  40 mg Subcutaneous Q24H  . levothyroxine  66 mcg Intravenous Daily  . naloxone      . naloxone  2 mg Intravenous Once  . piperacillin-tazobactam  3.375 g Intravenous Once  . sodium chloride  250 mL Intravenous Once  . sodium chloride  3 mL Intravenous Q12H  . vancomycin  1,000 mg Intravenous Once  . vancomycin  1,000 mg Intravenous Q8H   Assessment: Ok for protocol   Goal of Therapy:  Vancomycin trough level 15-20 mcg/ml  Plan:  Vancomycin 1 GM IV every 8 hours Unasyn 3 GM IV every 6 hours Measure antibiotic drug levels at steady state  [address], Antonya Leeder Heil 09/29/2011,6:07 PM
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
## 2011-09-29 NOTE — Plan of Care (Signed)
Problem: Phase I Progression Outcomes Goal: OOB as tolerated unless otherwise ordered Outcome: Not Progressing Altered mental status at this time. Goal: Voiding-avoid urinary catheter unless indicated Outcome: Not Progressing Foley placed in ED.

## 2011-09-30 ENCOUNTER — Inpatient Hospital Stay (HOSPITAL_COMMUNITY): Payer: Medicaid Other

## 2011-09-30 DIAGNOSIS — G01 Meningitis in bacterial diseases classified elsewhere: Secondary | ICD-10-CM

## 2011-09-30 DIAGNOSIS — E782 Mixed hyperlipidemia: Secondary | ICD-10-CM

## 2011-09-30 DIAGNOSIS — R569 Unspecified convulsions: Secondary | ICD-10-CM

## 2011-09-30 DIAGNOSIS — B20 Human immunodeficiency virus [HIV] disease: Secondary | ICD-10-CM

## 2011-09-30 DIAGNOSIS — J9600 Acute respiratory failure, unspecified whether with hypoxia or hypercapnia: Secondary | ICD-10-CM

## 2011-09-30 DIAGNOSIS — R75 Inconclusive laboratory evidence of human immunodeficiency virus [HIV]: Secondary | ICD-10-CM

## 2011-09-30 DIAGNOSIS — G9340 Encephalopathy, unspecified: Secondary | ICD-10-CM

## 2011-09-30 LAB — COMPREHENSIVE METABOLIC PANEL
AST: 258 U/L — ABNORMAL HIGH (ref 0–37)
Alkaline Phosphatase: 117 U/L (ref 39–117)
BUN: 13 mg/dL (ref 6–23)
CO2: 12 mEq/L — ABNORMAL LOW (ref 19–32)
Chloride: 100 mEq/L (ref 96–112)
Chloride: 105 mEq/L (ref 96–112)
Creatinine, Ser: <0.2 mg/dL — ABNORMAL LOW (ref 0.50–1.35)
Creatinine, Ser: 0.38 mg/dL — ABNORMAL LOW (ref 0.50–1.35)
GFR calc Af Amer: >90 mL/min (ref >90)
Glucose, Bld: 262 mg/dL — ABNORMAL HIGH (ref 70–99)
Glucose, Bld: 208 mg/dL — ABNORMAL HIGH (ref 70–99)
Potassium: 3.9 mEq/L (ref 3.5–5.1)
Total Bilirubin: 0.8 mg/dL (ref 0.3–1.2)
Total Bilirubin: 0.7 mg/dL (ref 0.3–1.2)

## 2011-09-30 LAB — BASIC METABOLIC PANEL
CO2: 16 mEq/L — ABNORMAL LOW (ref 19–32)
Chloride: 105 mEq/L (ref 96–112)
GFR calc non Af Amer: >90 mL/min (ref >90)
Glucose, Bld: 172 mg/dL — ABNORMAL HIGH (ref 70–99)
Potassium: 3.8 mEq/L (ref 3.5–5.1)
Sodium: 132 mEq/L — ABNORMAL LOW (ref 135–145)

## 2011-09-30 LAB — DIFFERENTIAL
Basophils Absolute: 0 10*3/uL (ref 0.0–0.1)
Eosinophils Absolute: 0 10*3/uL (ref 0.0–0.7)
Eosinophils Relative: 0 % (ref 0–5)
Lymphocytes Relative: 3 % — ABNORMAL LOW (ref 12–46)

## 2011-09-30 LAB — GASTRIC OCCULT BLOOD (1-CARD TO LAB): Occult Blood, Gastric: POSITIVE — AB

## 2011-09-30 LAB — BLOOD GAS, ARTERIAL
Acid-base deficit: 14.1 mmol/L — ABNORMAL HIGH (ref 0.0–2.0)
Drawn by: 21694
MECHVT: 550 mL
Patient temperature: 37
TCO2: 10.4 mmol/L (ref 0–100)
pH, Arterial: 7.257 — ABNORMAL LOW (ref 7.350–7.450)

## 2011-09-30 LAB — CBC
HCT: 42.9 % (ref 39.0–52.0)
MCH: 29.9 pg (ref 26.0–34.0)
MCHC: 35 g/dL (ref 30.0–36.0)
MCV: 85.1 fL (ref 78.0–100.0)
Platelets: 114 10*3/uL — ABNORMAL LOW (ref 150–400)
Platelets: 172 10*3/uL (ref 150–400)
RBC: 5.28 MIL/uL (ref 4.22–5.81)
RBC: 5.04 MIL/uL (ref 4.22–5.81)
WBC: 10 10*3/uL (ref 4.0–10.5)

## 2011-09-30 LAB — POCT I-STAT 3, ART BLOOD GAS (G3+)
Acid-base deficit: 15 mmol/L — ABNORMAL HIGH (ref 0.0–2.0)
Bicarbonate: 12.2 mEq/L — ABNORMAL LOW (ref 20.0–24.0)
Bicarbonate: 13.8 mEq/L — ABNORMAL LOW (ref 20.0–24.0)
O2 Saturation: 99 %
TCO2: 13 mmol/L (ref 0–100)
TCO2: 15 mmol/L (ref 0–100)
pCO2 arterial: 30.4 mmHg — ABNORMAL LOW (ref 35.0–45.0)
pH, Arterial: 7.266 — ABNORMAL LOW (ref 7.350–7.450)
pO2, Arterial: 195 mmHg — ABNORMAL HIGH (ref 80.0–100.0)

## 2011-09-30 LAB — CARDIAC PANEL(CRET KIN+CKTOT+MB+TROPI): Relative Index: 1.5 (ref 0.0–2.5)

## 2011-09-30 LAB — CRYPTOCOCCAL ANTIGEN: Crypto Ag: NEGATIVE

## 2011-09-30 LAB — LACTIC ACID, PLASMA: Lactic Acid, Venous: 3.7 mmol/L — ABNORMAL HIGH (ref 0.5–2.2)

## 2011-09-30 LAB — ETHANOL: Alcohol, Ethyl (B): <11 mg/dL (ref 0–11)

## 2011-09-30 MED ORDER — DEXTROSE 5 % IV SOLN
1.0000 mg/kg | INTRAVENOUS | Status: DC
  Administered 2011-09-30 – 2011-10-01 (×2): 80 mg via INTRAVENOUS
  Filled 2011-09-30 (×2): qty 80

## 2011-09-30 MED ORDER — PANTOPRAZOLE SODIUM 40 MG IV SOLR
40.0000 mg | Freq: Two times a day (BID) | INTRAVENOUS | Status: DC
  Administered 2011-09-30 – 2011-10-04 (×9): 40 mg via INTRAVENOUS
  Filled 2011-09-30 (×13): qty 40

## 2011-09-30 MED ORDER — FENTANYL BOLUS VIA INFUSION
50.0000 ug | Freq: Four times a day (QID) | INTRAVENOUS | Status: DC | PRN
  Filled 2011-09-30: qty 100

## 2011-09-30 MED ORDER — NONFORMULARY OR COMPOUNDED ITEM
1.0000 | Freq: Four times a day (QID) | Status: DC
  Filled 2011-09-30 (×5): qty 1

## 2011-09-30 MED ORDER — INSULIN ASPART 100 UNIT/ML ~~LOC~~ SOLN
0.0000 [IU] | SUBCUTANEOUS | Status: DC
  Administered 2011-09-30: 2 [IU] via SUBCUTANEOUS
  Administered 2011-09-30 – 2011-10-01 (×4): 3 [IU] via SUBCUTANEOUS
  Administered 2011-10-01: 5 [IU] via SUBCUTANEOUS
  Administered 2011-10-01: 3 [IU] via SUBCUTANEOUS
  Administered 2011-10-02 – 2011-10-03 (×7): 2 [IU] via SUBCUTANEOUS
  Administered 2011-10-05 – 2011-10-06 (×4): 3 [IU] via SUBCUTANEOUS
  Administered 2011-10-06 (×2): 2 [IU] via SUBCUTANEOUS
  Filled 2011-09-30: qty 3

## 2011-09-30 MED ORDER — LORAZEPAM 2 MG/ML IJ SOLN
2.0000 mg | INTRAMUSCULAR | Status: DC | PRN
  Administered 2011-09-30 (×4): 2 mg via INTRAVENOUS
  Filled 2011-09-30 (×4): qty 1

## 2011-09-30 MED ORDER — FENTANYL CITRATE 0.05 MG/ML IJ SOLN
INTRAMUSCULAR | Status: AC
  Filled 2011-09-30: qty 50

## 2011-09-30 MED ORDER — PANTOPRAZOLE SODIUM 40 MG IV SOLR
40.0000 mg | INTRAVENOUS | Status: DC
  Administered 2011-09-30: 40 mg via INTRAVENOUS
  Filled 2011-09-30: qty 40

## 2011-09-30 MED ORDER — SODIUM CHLORIDE 0.9 % IV SOLN
1.0000 mg/h | INTRAVENOUS | Status: DC
  Administered 2011-09-30: 2 mg/h via INTRAVENOUS
  Filled 2011-09-30 (×2): qty 10

## 2011-09-30 MED ORDER — HYDROCORTISONE SOD SUCCINATE 100 MG IJ SOLR: 50.0000 mg | Freq: Four times a day (QID) | INTRAMUSCULAR | Status: DC

## 2011-09-30 MED ORDER — SODIUM CHLORIDE 0.9 % IJ SOLN
INTRAMUSCULAR | Status: AC
  Filled 2011-09-30: qty 6

## 2011-09-30 MED ORDER — MIDAZOLAM HCL 2 MG/2ML IJ SOLN
INTRAMUSCULAR | Status: AC
  Filled 2011-09-30: qty 4

## 2011-09-30 MED ORDER — FENTANYL CITRATE 0.05 MG/ML IJ SOLN
50.0000 ug/h | INTRAMUSCULAR | Status: DC
  Administered 2011-09-30: 200 ug/h via INTRAVENOUS
  Filled 2011-09-30: qty 50

## 2011-09-30 MED ORDER — SODIUM CHLORIDE 0.9 % IV SOLN
2.0000 g | INTRAVENOUS | Status: DC
  Administered 2011-10-01 (×4): 2 g via INTRAVENOUS
  Filled 2011-09-30 (×9): qty 2000

## 2011-09-30 MED ORDER — NONFORMULARY OR COMPOUNDED ITEM
1.0000 | Freq: Four times a day (QID) | Status: DC
  Administered 2011-09-30 (×2): 1
  Filled 2011-09-30 (×6): qty 1

## 2011-09-30 MED ORDER — SODIUM CHLORIDE 0.9 % IV SOLN
50.0000 ug/h | INTRAVENOUS | Status: DC
  Administered 2011-09-30 – 2011-10-01 (×2): 100 ug/h via INTRAVENOUS
  Filled 2011-09-30 (×2): qty 50

## 2011-09-30 MED ORDER — DEXTROSE 5 % IV SOLN
10.0000 mg/kg | Freq: Three times a day (TID) | INTRAVENOUS | Status: DC
  Administered 2011-09-30 – 2011-10-01 (×3): 800 mg via INTRAVENOUS
  Filled 2011-09-30 (×5): qty 16

## 2011-09-30 MED ORDER — FLUCYTOSINE 500 MG PO CAPS
1000.0000 mg | ORAL_CAPSULE | Freq: Four times a day (QID) | ORAL | Status: DC
Start: 2011-09-30 — End: 2011-09-30
  Filled 2011-09-30 (×7): qty 2

## 2011-09-30 MED ORDER — SODIUM BICARBONATE 8.4 % IV SOLN
INTRAVENOUS | Status: DC
  Administered 2011-09-30 – 2011-10-01 (×2): via INTRAVENOUS
  Filled 2011-09-30 (×4): qty 150

## 2011-09-30 MED ORDER — SODIUM CHLORIDE 0.9 % IV BOLUS (SEPSIS)
1000.0000 mL | Freq: Once | INTRAVENOUS | Status: AC
  Administered 2011-09-30: 1000 mL via INTRAVENOUS

## 2011-09-30 MED ORDER — FENTANYL CITRATE 0.05 MG/ML IJ SOLN
100.0000 ug | INTRAMUSCULAR | Status: DC | PRN
  Filled 2011-09-30: qty 2

## 2011-09-30 MED ORDER — FENTANYL BOLUS VIA INFUSION
50.0000 ug | Freq: Four times a day (QID) | INTRAVENOUS | Status: DC | PRN
  Filled 2011-09-30 (×2): qty 100

## 2011-09-30 NOTE — H&P (Deleted)
Name: Troy Holden MRN: [id number] DOB: 04/15/1951    LOS: 1  PCCM FOLLOW UP NOTE  History of Present Illness: 60 yo HIV+ with lymphoma who was brought to [facility] with encephalopathy and intubated for airway protection.  Transferred to [facility] for further management.  Lines / Drains: 3/6  OETT>>> 3/6  OGT>>> 3/6  Foley>>>  Cultures: 3/6  Blood>>>  Antibiotics: 3/6  Zosyn x 1 3/6  Vancomycin>>> 3/6  Ceftriaxone>>> 3/6  Unasyn>>> 3/6  Acyclovir>>> 3/6  Amph B>>> 3/6  Flucytosine>>>  Tests / Events: 3/6  Brain MR>>> No acute infarct, white matter changes, sinusitis 3/7 2D ECHO>>>  Vital Signs: Filed Vitals:   09/30/11 0839 09/30/11 0900 09/30/11 1000 09/30/11 1100  BP:  151/89 144/86 133/91  Pulse:  72 66   Temp: 97.6 F (36.4 C)     TempSrc: Axillary     Resp:  11 9 17  Height:      Weight:      SpO2:  100% 100% 100%    I/O last 3 completed shifts: In: 3618.7 [I.V.:1332.5; IV Piggyback:2286.2] Out: 4055 [Urine:4055]  Physical Examination: General:  Intubated, mechanically ventilated, no acute distress, appears malnourished Neuro:  Sedated, synchronous, nonfocal HEENT:  Opaque right eye, pink conjunctivae, dry membranes, bitemporal waisting Neck:  Supple, no JVD   Cardiovascular:  RRR, no M/R/G Lungs:  Bilateral diminished air entry, no W/R/R Abdomen:  Soft, nontender, nondistended, bowel sounds present Musculoskeletal:  No pedal edema Skin:  No rash  Ventilator settings: Vent Mode:  [-] PRVC FiO2 (%):  [2 %-40 %] 40 % Set Rate:  [16 bmp-20 bmp] 16 bmp Vt Set:  [550 mL-650 mL] 550 mL PEEP:  [5 cmH20] 5 cmH20 Plateau Pressure:  [11 cmH20-16 cmH20] 11 cmH20  Labs and Imaging:  Reviewed.  Please refer to the Assessment and Plan section for relevant results.  ASSESSMENT AND PLAN  NEUROLOGIC A:  Metabolic encephalopathy.  Suspected meningitis in immunocompromised host. ? Extension form sinusitis.  No evidence of acute stroke on brain MRI.  No evidence of mass  lesions.  LP failed by IR.  Toxicology positive for opiates. Baseline dementia. P: -->  Intermittent Versed / Fentanyl to goal RASS 0 to -1 -->  Daily WUA -->  EEG -->  Decadron + ABX per ID section -->  Methanol, ethylene glycol, serum osm given gap acidosis-->  PULMONARY  Lab 09/30/11 0724 09/30/11 0330 09/29/11 1210  PHART 7.199* 7.257* 7.379  PCO2ART 31.2* 26.9* 29.6*  PO2ART 195.0* 191.0* 156.0*  HCO3 12.2* 11.6* 17.1*  O2SAT 99.0 98.9 99.1   A:  Acute respiratory failure.  Bilateral airspace disease. ? Aspiration P: -->  Full mechanical support -->  Goal pH>7.30, goal SpO2>92 -->  Follow up ABG -->  Follow up CXR -->  Daily SBT  CARDIOVASCULAR  Lab 09/30/11 0124 09/29/11 1756 09/29/11 1059 09/29/11 1058 09/29/11 1055  TROPONINI <0.30 <0.30 -- -- <0.30  LATICACIDVEN 3.7* -- 1.2 -- --  PROBNP -- -- -- 2418.0* --   A:  Hemodynamically stable.  No arrhythmia / ischemia.  Marginally elevated laccate.  Elevated BNP.  History of CAD. P: -->  TTE -->  Trend lactate   RENAL  Lab 09/30/11 0441 09/29/11 1055  NA 128* 128*  K 3.4* 3.9  CL 100 94*  CO2 12* 18*  BUN 12 13  CREATININE <0.20* 0.62  CALCIUM 7.6* 9.2  MG -- --  PHOS -- --   A:  Hyponatremia.  Metabolic acidosis (AG 16).   P: -->  BMP, Mg, Phos in AM -->  CMP now -->  IVF NS 100  GASTROINTESTINAL  Lab 09/30/11 0441 09/29/11 1055  AST 258* 85*  ALT 354* 107*  ALKPHOS 117 138*  BILITOT 0.8 0.7  PROT 6.2 7.7  ALBUMIN 3.3* 4.3   A:  Mildly elevated liver enzymes.  P: -->  Trend -->  NPO  HEMATOLOGIC  Lab 09/30/11 0441 09/29/11 1055  HGB 15.8 17.2*  HCT 45.2 47.8  PLT 114* 155  INR -- --  APTT -- --   A:  Hemoconcentration.  No overt hemorrhage.  Lymphoma by history (DLBCL). P: -->  CBC in AM  INFECTIOUS  Lab 09/30/11 0441 09/29/11 1107 09/29/11 1055  WBC 7.1 -- 10.9*  PROCALCITON -- 16.39 --   A:  No clear infectious source.  HIV+.  Unknown CD4.  Presumed immunocompromised.   Suspected meningitis. P: -->  Empiric ABX as above.  ENDOCRINE  Lab 09/30/11 1039 09/29/11 1340  GLUCAP 186* 175*   A:  Hyperglycemia, likely .  History of hypothyroidism. P: -->  CBG checks / SSI -->  Synthroid IV   BEST PRACTICE / DISPOSITION -->  ICU status under PCCM -->  Full code -->  NPO -->  Lovenox [place] for DVT Px -->  Protonix IV for GI Px -->  Ventilator bundle -->  Family is not available  Troy Vanwagner, NP-C [facility] Pgr: [phone]   
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
## 2011-09-30 NOTE — Progress Notes (Signed)
0330-Gave report to Jodene Nam, RN on 2100. 0340-Gave report to [facility].

## 2011-09-30 NOTE — Progress Notes (Signed)
EEG, portable w/ video performed.

## 2011-09-30 NOTE — Progress Notes (Signed)
[facility] Physician-Brief Progress Note Patient Name: Troy Holden DOB: [birth year] MRN: [id number]  Date of Service  09/30/2011   HPI/Events of Note   Aggitatation, sys 95  eICU Interventions  fent   Intervention Category Minor Interventions: Agitation / anxiety - evaluation and management  Cheyeanne Roadcap J. 09/30/2011, 2:50 AM

## 2011-09-30 NOTE — Progress Notes (Signed)
PHARMACY - CRITICAL CARE PROGRESS NOTE  PTA meds: . clopidogrel (PLAVIX) 75 MG tablet Take 75 mg by mouth daily.        Marland Kitchen docusate sodium (COLACE) 100 MG capsule Take 100 mg by mouth 2 (two) times daily.        Marland Kitchen emtricitabine-tenofovir (TRUVADA) 200-300 MG per tablet Take 1 tablet by mouth daily.        . famotidine (PEPCID) 20 MG tablet Take 20 mg by mouth 2 (two) times daily.        . fluconazole (DIFLUCAN) 200 MG tablet Take 200 mg by mouth daily.        . isosorbide mononitrate (IMDUR) 30 MG 24 hr tablet Take 30 mg by mouth daily.        Marland Kitchen levothyroxine (SYNTHROID, LEVOTHROID) 112 MCG tablet Take 112 mcg by mouth daily.        . magnesium oxide (MAG-OX) 400 MG tablet Take 400 mg by mouth 3 (three) times daily.        . metoprolol succinate (TOPROL-XL) 25 MG 24 hr tablet Take 12.5 mg by mouth daily.        . polyethylene glycol (MIRALAX / GLYCOLAX) packet Take 17 g by mouth daily.        . pravastatin (PRAVACHOL) 40 MG tablet Take 40 mg by mouth daily.        . raltegravir (ISENTRESS) 400 MG tablet Take 400 mg by mouth 2 (two) times daily.        Marland Kitchen senna-docusate (SENOKOT-S) 8.6-50 MG per tablet Take 2 tablets by mouth daily as needed for constipation.  60 tablet  1  . thiamine 100 MG tablet Take 100 mg by mouth daily.        . valACYclovir (VALTREX) 500 MG tablet Take 500 mg by mouth daily.        Marland Kitchen acetaminophen (TYLENOL) 650 MG CR tablet Take 650 mg by mouth every 6 (six) hours as needed.        . nitroGLYCERIN (NITROSTAT) 0.4 MG SL tablet Place 0.4 mg under the tongue every 5 (five) minutes as needed.        Marland Kitchen OLANZapine (ZYPREXA) 2.5 MG tablet Take 1 tablet (2.5 mg total) by mouth 3 (three) times daily as needed. For anxiety  90 tablet  0  . prochlorperazine (COMPAZINE) 10 MG tablet Take 10 mg by mouth every 8 (eight) hours as needed. For nausea and vomiting      . traZODone (DESYREL) 50 MG tablet Take 100 mg by mouth at bedtime as needed. For sleep       . triamcinolone (KENALOG) 0.1  % cream Apply 1 application topically daily.        Marland Kitchen zolpidem (AMBIEN) 10 MG tablet Take 10 mg by mouth at bedtime as needed. To help sleep        Pharmacy-Based Medication Review  61 y.o. male with AIDS and diffuse large b-cell lymphoma found down at assisted living facility. Xferred from [facility] with encephalopathy and intubated for airway protection, for further management.   PMH: HIV, DLBCL, CAD, GERD, thyroid dz, EBV, dementia   Infectious Disease: ID doesn't suspect meningitis-ABX stopped. Resumed fluconazole and atovaquone from home meds. All HIV meds currently on hold, until can take PO, bc Isentress cannot be crushed. CD4 110, cannot find VL pending/ordered (sending [facility] MRI unrevealing, been afeb, wbc up 12.8 from 7.1, could be up from decadron 14mg  IV q6h (Day 3 of 4 for CNS inflammation  in acute bacterial meningitis). CXR mild atx vs infiltration at lung bases. LP attempt successful, after multiple unsuccessful attempts.   Cx/Labs  3/6 Blood 2/2 >> ngtd 3/7 Cryptococcal Ag neg ?VL ordered   3/7-3/8    Acyclovir, AmphoB, Ampicillin 3/7          Flucytosine x2 doses  3/6-3/8    Azithromycin, Ceftriaxone, Vanc 3/6-3/7    Unasyn (changed to Ampicillin)  3/6          Zosyn x1 (changed to Unasyn)   Anticoagulation: love40, plt down 120 from 172 - mtr  Cardiovascular: BP 156/94, trop neg, Lactic acid ok 1.4 from 3.7, f/u 2D echo  Endocrinology: metabolic acidosis-improved pH 7.3, on 1amp bicarb in D5 @100cc /h, BG 150-184, on non-ICU insulin mod ssi, synthroid 66mg  IV changed to 56mg  (112 mg PO at home). TSH wnl 1.463.    GI / Nutrition: AST/ALT doubled 418/630, Alb 3.4, TBili 0.7.   Neurology: MRI, encephalopathy: fentanyl 353mcg/h, versed 6cc/h. 3/7 EEG: abnormal, mod enceph of nonspecific etiology no evidence of epilepsy. PTA pt was said to have been suicidal, saying that he planned to end his life. Tox screen neg, with exception of opioids; do not see any Rx opiates from home  med list. Tried 4mg  Narcan given 3/6.  Nephrology: SCr and BUN up today. UOP 1.66mL/kg/h.    Pulmonary: on vent, fio2 30  Hematology / Onc: DLBCL - recent R-EPOCH chemo. H/H 14.1/40.5, plt 120 - mtr  PTA meds: please see at top of note   Best Practices: MC, dexamethasone 14 q6h, love40, iv ppi   Plan: 1. Will follow up ability to take meds orally so that ARV can be resumed.  Zoe Lan, PharmD pgr [phone] 10/01/2011, 12:33 PM

## 2011-09-30 NOTE — ED Provider Notes (Signed)
INTUBATION Performed by: Geoffery Lyons  Required items: required blood products, implants, devices, and special equipment available Patient identity confirmed: provided demographic data and hospital-assigned identification number Time out: Immediately prior to procedure a "time out" was called to verify the correct patient, procedure, equipment, support staff and site/side marked as required.  Indications: Respiratory Failure, Altered Mental Status  Intubation method: Laryngoscope with #3 MacIntosh blade   Preoxygenation: BVM  Sedatives: 20 mg Etomidate Paralytic: 150 mg Succinylcholine  Tube Size: 7.5 cuffed  Post-procedure assessment: chest rise and ETCO2 monitor Breath sounds: equal and absent over the epigastrium Tube secured with: ETT holder Chest x-ray interpreted by radiologist and me.  Chest x-ray findings: endotracheal tube in appropriate position  Patient tolerated the procedure well with no immediate complications.  The first attempt at intubation resulted in an esophageal placement.  This was immediately identified, removed, and a new tube was used for the second attempt that went smoothly.  There were no desaturations.  Successful placement of the second attempt was confirmed with end tidal CO2, direct auscultation over the abdomen and chest.      Geoffery Lyons, MD 09/30/11 1610

## 2011-09-30 NOTE — Progress Notes (Signed)
UR completed. Troy Holden 09/30/2011 [phone] 

## 2011-09-30 NOTE — Progress Notes (Signed)
Name: Troy Holden MRN: [id number] DOB: Mar 27, 1951    LOS: 1  PCCM FOLLOW UP NOTE  History of Present Illness: 61 yo HIV+ with lymphoma who was brought to [facility] with encephalopathy and intubated for airway protection.  Transferred to [facility] for further management.  Lines / Drains: 3/6  OETT>>> 3/6  OGT>>> 3/6  Foley>>>  Cultures: 3/6  Blood>>>  Antibiotics: 3/6  Zosyn x 1 3/6  Vancomycin>>> 3/6  Ceftriaxone>>> 3/6  Unasyn>>> 3/6  Acyclovir>>> 3/6  Amph B>>> 3/6  Flucytosine>>>  Tests / Events: 3/6  Brain MR>>> No acute infarct, white matter changes, sinusitis 3/7 2D ECHO>>>  Vital Signs: Filed Vitals:   09/30/11 1600 09/30/11 1700 09/30/11 1758 09/30/11 1800  BP: 99/84 115/70  100/65  Pulse: 79 77 72 72  Temp: 99.4 F (37.4 C)     TempSrc: Oral     Resp: 24 21 20 18   Height:      Weight:      SpO2: 98% 98% 99% 99%    I/O last 3 completed shifts: In: 3618.7 [I.V.:1332.5; IV Piggyback:2286.2] Out: 4055 [Urine:4055]  Physical Examination: General:  Intubated, mechanically ventilated, no acute distress, appears malnourished Neuro:  Sedated, synchronous, nonfocal HEENT:  Opaque right eye, pink conjunctivae, dry membranes, bitemporal waisting Neck:  Supple, no JVD   Cardiovascular:  RRR, no M/R/G Lungs:  Bilateral diminished air entry, no W/R/R Abdomen:  Soft, nontender, nondistended, bowel sounds present Musculoskeletal:  No pedal edema Skin:  No rash  Ventilator settings: Vent Mode:  [-] PRVC FiO2 (%):  [30 %-40 %] 30 % Set Rate:  [16 bmp-20 bmp] 16 bmp Vt Set:  [550 mL-650 mL] 550 mL PEEP:  [5 cmH20] 5 cmH20 Plateau Pressure:  [8 cmH20-16 cmH20] 8 cmH20  Labs and Imaging:  Reviewed.  Please refer to the Assessment and Plan section for relevant results.  ASSESSMENT AND PLAN  NEUROLOGIC A:  Metabolic encephalopathy.  Suspected meningitis in immunocompromised host. ? Extension form sinusitis.  No evidence of acute stroke on brain MRI.  No evidence of mass  lesions.  LP failed by IR.  Toxicology positive for opiates. Baseline dementia. P: -->  Intermittent Versed / Fentanyl to goal RASS 0 to -1 -->  Daily WUA -->  EEG -->  Decadron + ABX per ID section -->  Methanol, ethylene glycol, serum osm given gap acidosis-->  PULMONARY  Lab 09/30/11 1739 09/30/11 0724 09/30/11 0330 09/29/11 1210  PHART 7.266* 7.199* 7.257* 7.379  PCO2ART 30.4* 31.2* 26.9* 29.6*  PO2ART 211.0* 195.0* 191.0* 156.0*  HCO3 13.8* 12.2* 11.6* 17.1*  O2SAT 100.0 99.0 98.9 99.1   A:  Acute respiratory failure.  Bilateral airspace disease. ? Aspiration P: -->  Full mechanical support -->  Goal pH>7.30, goal SpO2>92 -->  Follow up ABG -->  Follow up CXR -->  Daily SBT  CARDIOVASCULAR  Lab 09/30/11 1436 09/30/11 1036 09/30/11 0124 09/29/11 1756 09/29/11 1059 09/29/11 1058 09/29/11 1055  TROPONINI -- -- <0.30 <0.30 -- -- <0.30  LATICACIDVEN 1.4 1.3 3.7* -- 1.2 -- --  PROBNP -- -- -- -- -- 2418.0* --   A:  Hemodynamically stable.  No arrhythmia / ischemia.  Marginally elevated laccate.  Elevated BNP.  History of CAD. P: -->  TTE -->  Trend lactate   RENAL  Lab 09/30/11 1300 09/30/11 1150 09/30/11 0441 09/29/11 1055  NA -- 130* 128* 128*  K -- 3.9 3.4* --  CL -- 105 100 94*  CO2 -- 14* 12* 18*  BUN -- 13 12 13   CREATININE -- 0.38* <0.20* 0.62  CALCIUM -- 7.4* 7.6* 9.2  MG 2.2 -- -- --  PHOS -- -- -- --   A:  Hyponatremia.  Metabolic acidosis (AG 16). P: -->  BMP, Mg, Phos in AM -->  CMP now -->  IVF NS 100  GASTROINTESTINAL  Lab 09/30/11 1150 09/30/11 0441 09/29/11 1055  AST 418* 258* 85*  ALT 630* 354* 107*  ALKPHOS 117 117 138*  BILITOT 0.7 0.8 0.7  PROT 6.1 6.2 7.7  ALBUMIN 3.4* 3.3* 4.3   A:  Mildly elevated liver enzymes.  P: -->  Trend -->  NPO  HEMATOLOGIC  Lab 09/30/11 0441 09/29/11 1055  HGB 15.8 17.2*  HCT 45.2 47.8  PLT 114* 155  INR -- --  APTT -- --   A:  Hemoconcentration.  No overt hemorrhage.  Lymphoma by history  (DLBCL). P: -->  CBC in AM  INFECTIOUS  Lab 09/30/11 0441 09/29/11 1107 09/29/11 1055  WBC 7.1 -- 10.9*  PROCALCITON -- 16.39 --   A:  No clear infectious source.  HIV+.  Unknown CD4.  Presumed immunocompromised.  Suspected meningitis. P: -->  Empiric ABX as above.  ENDOCRINE  Lab 09/30/11 1553 09/30/11 1039 09/29/11 1340  GLUCAP 170* 186* 175*   A:  Hyperglycemia, likely .  History of hypothyroidism. P: -->  CBG checks / SSI -->  Synthroid IV   BEST PRACTICE / DISPOSITION -->  ICU status under PCCM -->  Full code -->  NPO -->  Lovenox [place] for DVT Px -->  Protonix IV for GI Px -->  Ventilator bundle -->  Family is not available  Canary Brim, NP-C [facility] Pgr: [phone]   Pt seen and examined and database reviewed. I agree with above findings, assessment and plan  Billy Fischer, MD;  PCCM service; Mobile [phone]

## 2011-09-30 NOTE — Progress Notes (Signed)
0215-Pt was intubated and sedated prior to procedure with 20 mg of Etomidate and 150 mg of Succincynicholine. Pt tolerated well and family was notified. 7.5 ETT and 24 at the lip. Chest xray was ordered for verification. Dr. Geoffery Lyons performed intubation.

## 2011-09-30 NOTE — Procedures (Signed)
EEG NUMBER:  [id number].  This portable routine EEG was requested in this 61 year old who was on a ventilator with altered mental status, HIV, lymphoma, and dementia who was found incontinent of stool.  MEDICATIONS:  His medications include lorazepam and midazolam.  The EEG was done with the patient unresponsive.  Background activities were composed of low-to-medium amplitude, poorly organized delta and theta activities.  These were noted to be invariant to painful stimulation.  Photic stimulation and hyperventilation were not performed.  No normal activities of sleep were seen.  EKG revealed a normal sinus rhythm.  CLINICAL INTERPRETATION:  This routine EEG done with the patient unresponsive is abnormal.  Background activities composed of mixed frequency delta and theta activities that are unreactive to painful stimulation, suggested a moderate to severe encephalopathy of nonspecific etiology.  No interictal epileptiform discharges, electrographic seizures, or nonconvulsive status epilepticus was seen.          ______________________________ Denton Meek, MD    YQ:MVHQ D:  09/30/2011 18:07:58  T:  09/30/2011 18:24:01  Job #:  [id number]

## 2011-09-30 NOTE — Progress Notes (Signed)
0140-Notified MD that we do not have Flucytosine. He will consult with critical care.

## 2011-09-30 NOTE — Progress Notes (Addendum)
ANTIBIOTIC CONSULT NOTE - INITIAL (late entry)  Pharmacy Consult for: Acyclovir, Amphotericin B, Azithromycin, and Ampicillin (follow up consult note for Vancomycin) - of note, pt also on CTX.  Indication: empiric antimicrobials in AIDS pt   LOS 1 at [facility], 09/30/2011  Date of admission to [facility]: 09/29/2011   Pharmacy-Based Medication Review  61 y.o. male with AIDS (CD4 110, ?VL) and lymphoma, found down at assisted living facility. Xferred from [facility] with encephalopathy and intubated for airway protection, for further management.   PMH: HIV, DLBCL, CAD, GERD, thyroid dz, EBV, dementia   Infectious Disease: AIDS - CD4 110, ?VL. ID following. T 97.6-99.4, wbc 7.1. Now on decadron 14mg . Imaging unrevealing. Unsuccessful IR LP attempts. All HIV meds currently on hold. Mg level after 1 dose amphoB 2.2, K 3.9. AST 354, ALT 354, Alb 3.3, TBili 0.8. CBC wnl, except plt 114. CLcr 114.  Imaging: 3/6: MRI brain - no acute infarcts 3/7: CXR - questionable hazy bilateral perihilar infiltrates, no acute cardiopulmonary abnormality.  Cx/Labs  3/6 Blood >> pending  3/7 Cryptococcal Ag >>  ??VL ordered  Antibiotics Acyclovir 905mg  IV q8h, 3/6>> AmphoB conventional 80mg  IV q24h, 3/7>>  Azithromycin 1200 mg IV weekly 3/6>> CTX 2g q12, 3/6>>  Vancomycin 1g q8, 3/6>> Flucytosine 3/7 x2 doses Unasyn 3/6-3/7 (changed to Ampicillin) Zosyn x1 3/6 (changed to Unasyn)  Anticoagulation: love40, cbc wnl, except plt 114 - watch   Cardiovascular: BP 156/94, trop neg, CKMB 16.7 and CK 1106. Lactic acid 1.4, BNP 2418, Procalcitonin 16.39. F/u 2D echo.  Endocrinology: metabolic acidosis-pH 7.199, PaO2 195, bicarb 12, acid-base def 15. BG 262 on non-ICU insulin mod ssi, synthroid 66 IV-TSH wnl 1.463. On bicarb drip.  GI / Nutrition: AST/ALT 258/354, Tbili 0.8, alb 3.3. PT 13.6, INR 1.02.   Neurology: MRI, encephalopathy: fentanyl 157mcg/h and ativan prn. F/u EEG.   Nephrology: CLcr 114, good UOP. UA trace Hgb.    Pulmonary: on vent, fio2 30  Hematology / Onc: DLBCL - unknown if pt had recent chemotherapy. No info in chart - [facility]??   PTA meds: Holding plavix, gabapentin, imdur, zyprexa, pravastatin, trazodone PRN sleep, ambien prn sleep (list not complete)  Best Practices: MC, dexamethasone 14 q6h, love40, iv ppi  Plan: 1. Decrease Acyclovir dose to 800mg  IV q8h to reflect actual body weight, as pt is not obese. Initially entered dose reflected an estimated body weight. 2. Continue AmphoB 80mg  IV q24h. Monitor for hypoMg, hypoK, elevated liver enzymes (already high), renal function, while on AmphoB. F/u cryptococcal antigen result.  3. Start Ampicillin 2g IV q4h for CNS dosing. Give 6 hours after 6pm Unasyn dose. F/u ability to obtain LP. 4. Continue Azithromycin 1200 mg IV weekly, next dose 3/13 (Wed). 5. Continue Vancomycin 1g q8. Should be at steady state tomorrow morning. F/u vancomycin trough at 730am 10/01/11. Monitor renal function, UOP, and lytes. 6. Address azithromycin with ID - to continue azithromycin for MAC Px, considering CD4>110. Next dose due 3/13 (Wed).  ((pt also on high dose Ceftriaxone - outside of pharmacy protocols))   Zoe Lan, PharmD pgr [phone] 09/30/2011, 7:06 PM

## 2011-09-30 NOTE — Progress Notes (Signed)
0035-notified MD of SBP in the 70's-80's and DBP-40's-50's.

## 2011-09-30 NOTE — Progress Notes (Signed)
eLink Physician-Brief Progress Note Patient Name: Brysin Towery DOB: 1950/12/22 MRN: [id number]  Date of Service  09/30/2011   HPI/Events of Note  D/w Dr Orvan Falconer, neurostatus poor, poor GCS, vomit x 1?, being treated meningitis, cd4? MAp 52, 1 lit only in , on decadron   eICU Interventions  Consider itubation, dr C to contact EDP, add empiric antifungal, asses lactic, cortisol on decadron, give volume, may need line, failed LP prior  Noted, abg last reviewed     Intervention Category Major Interventions: Shock - evaluation and management  Louiza Moor J. 09/30/2011, 12:58 AM

## 2011-09-30 NOTE — H&P (Signed)
Name: Troy Holden MRN: [id number] DOB: 08/07/1950    LOS: 1  PCCM ADMISSION NOTE  History of Present Illness: 61 yo HIV+ with lymphoma who was [facility] to [facility] with encephalopathy and intubated for airway protection.  Transferred to [facility] for further management.  Lines / Drains: 3/6  OETT 3/6  OGT 3/6  Foley  Cultures: 3/6  Blood  Antibiotics: 3/6  Zosyn x 1 3/6  Vancomycin>>> 3/6  Ceftriaxone>>> 3/6  Unasyn>>> 3/6  Acyclovir>>> 3/6  Amph B>>> 3/6  Flucytosine>>>  Tests / Events: 3/6  Brain MR>>> No acute infarct, white matter changes, sinusitis  The patient is sedated, intubated and unable to provide history, which was obtained for available medical records.    Past Medical History  Diagnosis Date  . Heart disease   . Cancer     lymphoma  . HIV (human immunodeficiency virus infection)   . Anemia   . Anxiety   . Blood transfusion   . Cataract   . Depression   . GERD (gastroesophageal reflux disease)   . Thyroid disease     low thyroid  . 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 HIV (human immunodeficiency virus infection) 04/20/2011  . Dementia 04/20/2011  . CAD (coronary artery disease) 04/20/2011  . Hypothyroidism 04/20/2011  . Dyslipidemia 04/20/2011   Past Surgical History  Procedure Date  . Cardiac surgery   . Abdominal surgery    Prior to Admission medications   Medication Sig Start Date End Date Taking? Authorizing Provider  atovaquone (MEPRON) 750 MG/5ML suspension Take 1,500 mg by mouth daily.   Yes Historical Provider, MD  clopidogrel (PLAVIX) 75 MG tablet Take 75 mg by mouth daily.     Yes Historical Provider, MD  docusate sodium (COLACE) 100 MG capsule Take 100 mg by mouth 2 (two) times daily.     Yes Historical Provider, MD  emtricitabine-tenofovir (TRUVADA) 200-300 MG per tablet Take 1 tablet by mouth daily.     Yes Historical Provider, MD  famotidine (PEPCID) 20 MG tablet Take 20 mg by mouth  2 (two) times daily.     Yes Historical Provider, MD  fluconazole (DIFLUCAN) 200 MG tablet Take 200 mg by mouth daily.     Yes Historical Provider, MD  gabapentin (NEURONTIN) 300 MG capsule Take 300 mg by mouth 3 (three) times daily. **May increase to 2 capsules 3 times daily in 5 days**   Yes Historical Provider, MD  isosorbide mononitrate (IMDUR) 30 MG 24 hr tablet Take 30 mg by mouth daily.     Yes Historical Provider, MD  levothyroxine (SYNTHROID, LEVOTHROID) 112 MCG tablet Take 112 mcg by mouth daily.     Yes Historical Provider, MD  magnesium oxide (MAG-OX) 400 MG tablet Take 400 mg by mouth 3 (three) times daily.     Yes Historical Provider, MD  metoprolol succinate (TOPROL-XL) 25 MG 24 hr tablet Take 12.5 mg by mouth daily.     Yes Historical Provider, MD  polyethylene glycol (MIRALAX / GLYCOLAX) packet Take 17 g by mouth daily.     Yes Historical Provider, MD  pravastatin (PRAVACHOL) 40 MG tablet Take 40 mg by mouth daily.     Yes Historical Provider, MD  raltegravir (ISENTRESS) 400 MG tablet Take 400 mg by mouth 2 (two) times daily.     Yes Historical Provider, MD  senna-docusate (SENOKOT-S) 8.6-50 MG per tablet Take 2 tablets by mouth daily as needed for constipation. 04/26/11  04/25/12 Yes Dellis Anes, PA  thiamine 100 MG tablet Take 100 mg by mouth daily.     Yes Historical Provider, MD  valACYclovir (VALTREX) 500 MG tablet Take 500 mg by mouth daily.     Yes Historical Provider, MD  acetaminophen (TYLENOL) 650 MG CR tablet Take 650 mg by mouth every 6 (six) hours as needed.      Historical Provider, MD  nitroGLYCERIN (NITROSTAT) 0.4 MG SL tablet Place 0.4 mg under the tongue every 5 (five) minutes as needed.      Historical Provider, MD  OLANZapine (ZYPREXA) 2.5 MG tablet Take 1 tablet (2.5 mg total) by mouth 3 (three) times daily as needed. For anxiety 05/13/11   Dellis Anes, PA  prochlorperazine (COMPAZINE) 10 MG tablet Take 10 mg by mouth every 8 (eight) hours as needed. For  nausea and vomiting    Historical Provider, MD  traZODone (DESYREL) 50 MG tablet Take 100 mg by mouth at bedtime as needed. For sleep     Historical Provider, MD  triamcinolone (KENALOG) 0.1 % cream Apply 1 application topically daily.      Historical Provider, MD  zolpidem (AMBIEN) 10 MG tablet Take 10 mg by mouth at bedtime as needed. To help sleep     Historical Provider, MD   Allergies Allergies  Allergen Reactions  . Xylocaine Rash    Patient had to be rushed to hospital from dentist office after receiving xylocaine  . Trazodone And Nefazodone Other (See Comments)    Nightmares, hangover like feeling for 2-3 days   Family History Family History  Problem Relation Age of Onset  . Colon cancer Mother   . Colon cancer Brother    Social History  reports that he has been smoking Cigarettes.  He has a 10 pack-year smoking history. He does not have any smokeless tobacco history on file. He reports that he drinks alcohol. He reports that he does not use illicit drugs.  Review Of Systems  Patient unable to provide  Vital Signs: Temp:  [97.2 F (36.2 C)-98.4 F (36.9 C)] 97.5 F (36.4 C) (03/07 0400) Pulse Rate:  [76-97] 77  (03/07 0500) Resp:  [15-23] 15  (03/07 0500) BP: (75-158)/(44-95) 96/60 mmHg (03/07 0400) SpO2:  [96 %-100 %] 100 % (03/07 0500) FiO2 (%):  [2 %-40 %] 40 % (03/07 0500) Weight:  [80.2 kg (176 lb 12.9 oz)-90.719 kg (200 lb)] 83.4 kg (183 lb 13.8 oz) (03/07 0400) I/O last 3 completed shifts: In: 293.1 [I.V.:125; IV Piggyback:168.1] Out: 2050 [Urine:2050]  Physical Examination: General:  Intubated, mechanically ventilated, no acute distress, appears malnourished Neuro:  Sedated, synchronous, nonfocal HEENT:  Opaque right eye, pink conjunctivae, dry membranes, bitemporal waisting Neck:  Supple, no JVD   Cardiovascular:  RRR, no M/R/G Lungs:  Bilateral diminished air entry, no W/R/R Abdomen:  Soft, nontender, nondistended, bowel sounds present Musculoskeletal:   No pedal edema Skin:  No rash  Ventilator settings: Vent Mode:  [-] PRVC FiO2 (%):  [2 %-40 %] 40 % Set Rate:  [16 bmp] 16 bmp Vt Set:  [550 mL] 550 mL  Labs and Imaging:  Reviewed.  Please refer to the Assessment and Plan section for relevant results.  ASSESSMENT AND PLAN  NEUROLOGIC A:  Metabolic encephalopathy.  Suspected meningitis in immunocompromised host. ? Extension form sinusitis.  No evidence of acute stroke on brain MRI.  No evidence of mass lesions.  LP failed by IR.  Toxicology positive for opiates. Baseline dementia. P: -->  Intermittent  Versed / Fentanyl to goal RASS 0 to -1 -->  Daily WUA -->  EEG -->  Decadron + ABX per ID section -->  Methanol, ethylene glycol, serum osm given gap acidosis  PULMONARY  Lab 09/30/11 0330 09/29/11 1210  PHART 7.257* 7.379  PCO2ART 26.9* 29.6*  PO2ART 191.0* 156.0*  HCO3 11.6* 17.1*  O2SAT 98.9 99.1   A:  Acute respiratory failure.  Bilateral airspace disease. ? Aspiration P: -->  Full mechanical support -->  Goal pH>7.30, goal SpO2>92 -->  Follow up ABG -->  Follow up CXR -->  Daily SBT  CARDIOVASCULAR  Lab 09/30/11 0124 09/29/11 1756 09/29/11 1059 09/29/11 1058 09/29/11 1055  TROPONINI <0.30 <0.30 -- -- <0.30  LATICACIDVEN 3.7* -- 1.2 -- --  PROBNP -- -- -- 2418.0* --   A:  Hemodynamically stable.  No arrhythmia / ischemia.  Marginally elevated laccate.  Elevated BNP.  History of CAD. P: -->  TTE -->  Trend lactate   RENAL  Lab 09/29/11 1055  NA 128*  K 3.9  CL 94*  CO2 18*  BUN 13  CREATININE 0.62  CALCIUM 9.2  MG --  PHOS --   A:  Hyponatremia.  Metabolic acidosis (AG 16). P: -->  BMP, Mg, Phos in AM -->  CMP now -->  IVF NS 100  GASTROINTESTINAL  Lab 09/29/11 1055  AST 85*  ALT 107*  ALKPHOS 138*  BILITOT 0.7  PROT 7.7  ALBUMIN 4.3   A:  Mildly elevated liver enzymes.  P: -->  Trend -->  NPO  HEMATOLOGIC  Lab 09/29/11 1055  HGB 17.2*  HCT 47.8  PLT 155  INR --  APTT --    A:  Hemoconcentration.  No overt hemorrhage.  Lymphoma by history (DLBCL). P: -->  CBC in AM  INFECTIOUS  Lab 09/29/11 1107 09/29/11 1055  WBC -- 10.9*  PROCALCITON 16.39 --   A:  No clear infectious source.  HIV+.  Unknown CD4.  Presumed immunocompromised.  Suspected meningitis. P: -->  Empirical ABX as above.  ENDOCRINE  Lab 09/29/11 1340  GLUCAP 175*   A:  Hyperglycemia, likely .  History of hypothyroidism. P: -->  CBG checks / SSI -->  Synthroid IV   BEST PRACTICE / DISPOSITION -->  ICU status under PCCM -->  Full code -->  NPO -->  Lovenox [place] for DVT Px -->  Protonix IV for GI Px -->  Ventilator bundle -->  Family is not available  The patient is critically ill with multiple organ systems failure and requires high complexity decision making for assessment and support, frequent evaluation and titration of therapies, application of advanced monitoring technologies and extensive interpretation of multiple databases. Critical Care Time devoted to patient care services described in this note is 40 minutes.  Orlean Bradford, M.D., F.C.C.P. Pulmonary and Critical Care Medicine [facility] Cell: [phone] Pager: [phone]  09/30/2011, 6:06 AM

## 2011-09-30 NOTE — Progress Notes (Signed)
Name: Joab Carden MRN: [id number] DOB: [birth year]    LOS: 1  PCCM FOLLOW UP NOTE  History of Present Illness: 61 yo HIV+ with lymphoma who was brought to [facility] with encephalopathy and intubated for airway protection.  Transferred to [facility] for further management.  Lines / Drains: 3/6  OETT>>> 3/6  OGT>>> 3/6  Foley>>>  Cultures: 3/6  Blood>>>  Antibiotics: 3/6  Zosyn x 1 3/6  Vancomycin>>> 3/6  Ceftriaxone>>> 3/6  Unasyn>>> 3/6  Acyclovir>>> 3/6  Amph B>>> 3/6  Flucytosine>>>  Tests / Events: 3/6  Brain MR>>> No acute infarct, white matter changes, sinusitis 3/7 2D ECHO>>>  Vital Signs: Filed Vitals:   09/30/11 0839 09/30/11 0900 09/30/11 1000 09/30/11 1100  BP:  151/89 144/86 133/91  Pulse:  72 66   Temp: 97.6 F (36.4 C)     TempSrc: Axillary     Resp:  11 9 17   Height:      Weight:      SpO2:  100% 100% 100%    I/O last 3 completed shifts: In: 3618.7 [I.V.:1332.5; IV Piggyback:2286.2] Out: 4055 [Urine:4055]  Physical Examination: General:  Intubated, mechanically ventilated, no acute distress, appears malnourished Neuro:  Sedated, synchronous, nonfocal HEENT:  Opaque right eye, pink conjunctivae, dry membranes, bitemporal waisting Neck:  Supple, no JVD   Cardiovascular:  RRR, no M/R/G Lungs:  Bilateral diminished air entry, no W/R/R Abdomen:  Soft, nontender, nondistended, bowel sounds present Musculoskeletal:  No pedal edema Skin:  No rash  Ventilator settings: Vent Mode:  [-] PRVC FiO2 (%):  [2 %-40 %] 40 % Set Rate:  [16 bmp-20 bmp] 16 bmp Vt Set:  [550 mL-650 mL] 550 mL PEEP:  [5 cmH20] 5 cmH20 Plateau Pressure:  [11 cmH20-16 cmH20] 11 cmH20  Labs and Imaging:  Reviewed.  Please refer to the Assessment and Plan section for relevant results.  ASSESSMENT AND PLAN  NEUROLOGIC A:  Metabolic encephalopathy.  Suspected meningitis in immunocompromised host. ? Extension form sinusitis.  No evidence of acute stroke on brain MRI.  No evidence of mass  lesions.  LP failed by IR.  Toxicology positive for opiates. Baseline dementia. P: -->  Intermittent Versed / Fentanyl to goal RASS 0 to -1 -->  Daily WUA -->  EEG -->  Decadron + ABX per ID section -->  Methanol, ethylene glycol, serum osm given gap acidosis-->  PULMONARY  Lab 09/30/11 0724 09/30/11 0330 09/29/11 1210  PHART 7.199* 7.257* 7.379  PCO2ART 31.2* 26.9* 29.6*  PO2ART 195.0* 191.0* 156.0*  HCO3 12.2* 11.6* 17.1*  O2SAT 99.0 98.9 99.1   A:  Acute respiratory failure.  Bilateral airspace disease. ? Aspiration P: -->  Full mechanical support -->  Goal pH>7.30, goal SpO2>92 -->  Follow up ABG -->  Follow up CXR -->  Daily SBT  CARDIOVASCULAR  Lab 09/30/11 0124 09/29/11 1756 09/29/11 1059 09/29/11 1058 09/29/11 1055  TROPONINI <0.30 <0.30 -- -- <0.30  LATICACIDVEN 3.7* -- 1.2 -- --  PROBNP -- -- -- 2418.0* --   A:  Hemodynamically stable.  No arrhythmia / ischemia.  Marginally elevated laccate.  Elevated BNP.  History of CAD. P: -->  TTE -->  Trend lactate   RENAL  Lab 09/30/11 0441 09/29/11 1055  NA 128* 128*  K 3.4* 3.9  CL 100 94*  CO2 12* 18*  BUN 12 13  CREATININE <0.20* 0.62  CALCIUM 7.6* 9.2  MG -- --  PHOS -- --   A:  Hyponatremia.  Metabolic acidosis (AG 16).  P: -->  BMP, Mg, Phos in AM -->  CMP now -->  IVF NS 100  GASTROINTESTINAL  Lab 09/30/11 0441 09/29/11 1055  AST 258* 85*  ALT 354* 107*  ALKPHOS 117 138*  BILITOT 0.8 0.7  PROT 6.2 7.7  ALBUMIN 3.3* 4.3   A:  Mildly elevated liver enzymes.  P: -->  Trend -->  NPO  HEMATOLOGIC  Lab 09/30/11 0441 09/29/11 1055  HGB 15.8 17.2*  HCT 45.2 47.8  PLT 114* 155  INR -- --  APTT -- --   A:  Hemoconcentration.  No overt hemorrhage.  Lymphoma by history (DLBCL). P: -->  CBC in AM  INFECTIOUS  Lab 09/30/11 0441 09/29/11 1107 09/29/11 1055  WBC 7.1 -- 10.9*  PROCALCITON -- 16.39 --   A:  No clear infectious source.  HIV+.  Unknown CD4.  Presumed immunocompromised.   Suspected meningitis. P: -->  Empiric ABX as above.  ENDOCRINE  Lab 09/30/11 1039 09/29/11 1340  GLUCAP 186* 175*   A:  Hyperglycemia, likely .  History of hypothyroidism. P: -->  CBG checks / SSI -->  Synthroid IV   BEST PRACTICE / DISPOSITION -->  ICU status under PCCM -->  Full code -->  NPO -->  Lovenox [place] for DVT Px -->  Protonix IV for GI Px -->  Ventilator bundle -->  Family is not available  Canary Brim, NP-C [facility] Pgr: [phone]

## 2011-09-30 NOTE — Procedures (Signed)
Central Venous Catheter Insertion Procedure Note Troy Holden [id number] Apr 08, 1951  Procedure: Insertion of Central Venous Catheter Indications: Assessment of intravascular volume and Drug and/or fluid administration  Procedure Details Consent: Unable to obtain consent because of emergent medical necessity. Time Out: Verified patient identification, verified procedure, site/side was marked, verified correct patient position, special equipment/implants available, medications/allergies/relevent history reviewed, required imaging and test results available.  Performed  Maximum sterile technique was used including antiseptics, cap, gloves, gown, hand hygiene, mask and sheet. Skin prep: Chlorhexidine; local anesthetic administered A antimicrobial bonded/coated triple lumen catheter was placed in the right internal jugular vein using the Seldinger technique. Ultrasound guidance used.yes Catheter placed to 17 cm. Blood aspirated via all 3 ports and then flushed x 3. Line sutured x 2 and dressing applied.  Evaluation Blood flow good Complications: No apparent complications Patient did tolerate procedure well. Chest X-ray ordered to verify placement.  CXR: pending.  Performed under direct MD supervision by Devra Dopp ACNP 09/30/2011 11:01 AM

## 2011-09-30 NOTE — Progress Notes (Signed)
0515-Pt transported by [facility] to [facility]. [facility] notified.

## 2011-09-30 NOTE — Progress Notes (Signed)
INITIAL ADULT NUTRITION ASSESSMENT Date: 09/30/2011   Time: 11:22 AM  Reason for Assessment: VDRF  ASSESSMENT: Male 61 y.o.  Dx: Toxic metabolic encephalopathy  Hx:  Past Medical History  Diagnosis Date  . Heart disease   . Cancer     lymphoma  . HIV (human immunodeficiency virus infection)   . Anemia   . Anxiety   . Blood transfusion   . Cataract   . Depression   . GERD (gastroesophageal reflux disease)   . Thyroid disease     low thyroid  . 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 HIV (human immunodeficiency virus infection) 04/20/2011  . Dementia 04/20/2011  . CAD (coronary artery disease) 04/20/2011  . Hypothyroidism 04/20/2011  . Dyslipidemia 04/20/2011    Related Meds:  Scheduled Meds:   . sodium chloride   Intravenous STAT  . acyclovir  10 mg/kg Intravenous Q8H  . amphotericin B conventional (FUNGIZONE) IV  1 mg/kg Intravenous Q24H  . ampicillin-sulbactam (UNASYN) IV  3 g Intravenous Q6H  . antiseptic oral rinse  15 mL Mouth Rinse QID  . cefTRIAXone (ROCEPHIN)  IV  2 g Intravenous Q12H  . chlorhexidine  15 mL Mouth/Throat BID  . dexamethasone  14 mg Intravenous Q6H  . dextrose 5 % 600 mL with azithromycin (ZITHROMAX) 1,200 mg infusion   Intravenous Q7 days  . enoxaparin  40 mg Subcutaneous Q24H  . insulin aspart  0-15 Units Subcutaneous Q4H  . levothyroxine  66 mcg Intravenous QAC breakfast  . naloxone  2 mg Intravenous Once  . NONFORMULARY OR COMPOUNDED ITEM 1 each  1 each Per Tube Q6H  . pantoprazole (PROTONIX) IV  40 mg Intravenous Q24H  . piperacillin-tazobactam  3.375 g Intravenous Once  . sodium chloride  1,000 mL Intravenous Once  . sodium chloride  1,000 mL Intravenous Once  . sodium chloride  3 mL Intravenous Q12H  . sodium chloride      . vancomycin  1,000 mg Intravenous Once  . vancomycin  1,000 mg Intravenous Q8H  . DISCONTD: flucytosine  1,000 mg Oral Q6H  . DISCONTD: hydrocortisone  sodium succinate  50 mg Intravenous Q6H  . DISCONTD: NONFORMULARY OR COMPOUNDED ITEM 1 each  1 each Per Tube Q6H   Continuous Infusions:   . sodium chloride 100 mL/hr at 09/30/11 1100  . DISCONTD: sodium chloride 125 mL/hr at 09/29/11 1800  . DISCONTD: fentaNYL infusion INTRAVENOUS 200 mcg/hr (09/30/11 0329)   PRN Meds:.fentaNYL, gadobenate dimeglumine, LORazepam, ondansetron (ZOFRAN) IV, ondansetron, DISCONTD: fentaNYL   Ht: 6\' 2"  (188 cm) (6'2)  Wt: 179 lb 3.7 oz (81.3 kg)  Ideal Wt: 86.4 kg % Ideal Wt: 94%  Wt Readings from Last 12 Encounters:  09/30/11 179 lb 3.7 oz (81.3 kg)  09/16/11 185 lb (83.915 kg)  09/13/11 185 lb (83.915 kg)  04/20/11 197 lb (89.359 kg)  03/30/11 190 lb (86.183 kg)  03/27/11 192 lb (87.091 kg)  03/26/11 192 lb (87.091 kg)   Usual Wt: 197 lb (6 months ago) % Usual Wt: 90.8%  Body mass index is 23.01 kg/(m^2).  Food/Nutrition Related Hx: ~10% weight loss over the past 6 months.  Labs:  CMP     Component Value Date/Time   NA 128* 09/30/2011 0441   K 3.4* 09/30/2011 0441   CL 100 09/30/2011 0441   CO2 12* 09/30/2011 0441   GLUCOSE 262* 09/30/2011 0441   BUN 12 09/30/2011 0441   CREATININE <0.20* 09/30/2011  0441   CALCIUM 7.6* 09/30/2011 0441   PROT 6.2 09/30/2011 0441   ALBUMIN 3.3* 09/30/2011 0441   AST 258* 09/30/2011 0441   ALT 354* 09/30/2011 0441   ALKPHOS 117 09/30/2011 0441   BILITOT 0.8 09/30/2011 0441   GFRNONAA NOT CALCULATED 09/30/2011 0441   GFRAA NOT CALCULATED 09/30/2011 0441    CBG (last 3)   Basename 09/30/11 1039 09/29/11 1340  GLUCAP 186* 175*    Intake/Output Summary (Last 24 hours) at 09/30/11 1129 Last data filed at 09/30/11 1100  Gross per 24 hour  Intake 3658.7 ml  Output   4555 ml  Net -896.3 ml    Diet Order: NPO  IVF:    sodium chloride Last Rate: 100 mL/hr at 09/30/11 1100  DISCONTD: sodium chloride Last Rate: 125 mL/hr at 09/29/11 1800  DISCONTD: fentaNYL infusion INTRAVENOUS Last Rate: 200 mcg/hr (09/30/11 0329)     Estimated Nutritional Needs:   Kcal: 1960 Protein: 97-114 grams Fluid: 2-2.1 liters  Patient with moderate malnutrition in the context of chronic illness, given ~10% weight loss in 6 months, mild wasting of temples, appears malnourished per CCM admission note with bitemporal wasting.  NUTRITION DIAGNOSIS: 1-Inadequate oral intake (NI-2.1).  Status: Ongoing  RELATED TO: inability to eat  AS EVIDENCED BY: NPO status  2-Malnutrition related to chronic catabolic illness as evidenced by ~10% weight loss in 6 months with temporal wasting.  MONITORING/EVALUATION(Goals): Intake to meet 90-100% of estimated nutrition needs.  EDUCATION NEEDS: -Education not appropriate at this time  INTERVENTION: Recommend place enteral feeding tube and start TF with Osmolite 1.2 at 25 ml/h, increase by 10 ml every 4 hours to goal rate of 65 ml/h with Prostat 30 ml once daily to provide 1944 kcals, 102 grams protein, 1279 ml free water daily.  Dietitian #:  [phone]  DOCUMENTATION CODES Per approved criteria  -Non-severe (moderate) malnutrition in the context of chronic illness    Hettie Holstein 09/30/2011, 11:22 AM

## 2011-09-30 NOTE — Progress Notes (Signed)
eLink Physician-Brief Progress Note Patient Name: Troy Holden DOB: December 28, 1950 MRN: [id number]  Date of Service  09/30/2011   HPI/Events of Note   Added flucto, ampho empiric  eICU Interventions  placed pharm consult and I am calling them to dose now!   Intervention Category Major Interventions: Shock - evaluation and management  FEINSTEIN,DANIEL J. 09/30/2011, 1:14 AM

## 2011-10-01 ENCOUNTER — Inpatient Hospital Stay (HOSPITAL_COMMUNITY): Payer: Medicaid Other

## 2011-10-01 DIAGNOSIS — R75 Inconclusive laboratory evidence of human immunodeficiency virus [HIV]: Secondary | ICD-10-CM

## 2011-10-01 DIAGNOSIS — G9340 Encephalopathy, unspecified: Secondary | ICD-10-CM

## 2011-10-01 DIAGNOSIS — R4182 Altered mental status, unspecified: Secondary | ICD-10-CM

## 2011-10-01 DIAGNOSIS — B20 Human immunodeficiency virus [HIV] disease: Secondary | ICD-10-CM

## 2011-10-01 DIAGNOSIS — Z9911 Dependence on respirator [ventilator] status: Secondary | ICD-10-CM

## 2011-10-01 LAB — BASIC METABOLIC PANEL
Chloride: 104 mEq/L (ref 96–112)
Creatinine, Ser: 1.15 mg/dL (ref 0.50–1.35)
GFR calc Af Amer: 79 mL/min — ABNORMAL LOW (ref >90)

## 2011-10-01 LAB — GLUCOSE, CAPILLARY
Glucose-Capillary: 164 mg/dL — ABNORMAL HIGH (ref 70–99)
Glucose-Capillary: 86 mg/dL (ref 70–99)

## 2011-10-01 LAB — BLOOD GAS, ARTERIAL
Acid-base deficit: 8.6 mmol/L — ABNORMAL HIGH (ref 0.0–2.0)
FIO2: 0.3 %
MECHVT: 550 mL
TCO2: 17.8 mmol/L (ref 0–100)
pCO2 arterial: 34.3 mmHg — ABNORMAL LOW (ref 35.0–45.0)
pO2, Arterial: 130 mmHg — ABNORMAL HIGH (ref 80.0–100.0)

## 2011-10-01 LAB — CBC
MCH: 30.1 pg (ref 26.0–34.0)
MCHC: 34.8 g/dL (ref 30.0–36.0)
Platelets: 120 10*3/uL — ABNORMAL LOW (ref 150–400)
RBC: 4.69 MIL/uL (ref 4.22–5.81)

## 2011-10-01 LAB — MAGNESIUM: Magnesium: 2.2 mg/dL (ref 1.5–2.5)

## 2011-10-01 LAB — CD4/CD8 (T-HELPER/T-SUPPRESSOR CELL): Ratio: 0.26 — ABNORMAL LOW (ref 1.0–3.0)

## 2011-10-01 MED ORDER — POTASSIUM CHLORIDE 20 MEQ/15ML (10%) PO LIQD
40.0000 meq | ORAL | Status: AC
  Administered 2011-10-01 (×2): 40 meq
  Filled 2011-10-01 (×2): qty 30

## 2011-10-01 MED ORDER — RALTEGRAVIR POTASSIUM 400 MG PO TABS
400.0000 mg | ORAL_TABLET | Freq: Two times a day (BID) | ORAL | Status: DC
  Filled 2011-10-01 (×2): qty 1

## 2011-10-01 MED ORDER — SODIUM CHLORIDE 0.9 % IV SOLN
Freq: Four times a day (QID) | INTRAVENOUS | Status: DC
  Filled 2011-10-01 (×4): qty 25

## 2011-10-01 MED ORDER — FLUCONAZOLE IN SODIUM CHLORIDE 200-0.9 MG/100ML-% IV SOLN
200.0000 mg | Freq: Every day | INTRAVENOUS | Status: DC
  Administered 2011-10-01 – 2011-10-02 (×2): 200 mg via INTRAVENOUS
  Filled 2011-10-01 (×2): qty 100

## 2011-10-01 MED ORDER — EMTRICITABINE-TENOFOVIR DF 200-300 MG PO TABS
1.0000 | ORAL_TABLET | Freq: Every day | ORAL | Status: DC
  Filled 2011-10-01: qty 1

## 2011-10-01 MED ORDER — FENTANYL CITRATE 0.05 MG/ML IJ SOLN: 12.5000 ug | INTRAMUSCULAR | Status: DC | PRN

## 2011-10-01 MED ORDER — ATOVAQUONE 750 MG/5ML PO SUSP
1500.0000 mg | Freq: Every day | ORAL | Status: DC
  Filled 2011-10-01 (×4): qty 10

## 2011-10-01 MED FILL — Flucytosine Cap 250 MG: ORAL | Qty: 64 | Status: AC

## 2011-10-01 NOTE — Procedures (Signed)
Extubation Procedure Note  Patient Details:   Name: Troy Holden DOB: February 23, 1951 MRN: [id number]   Airway Documentation:  Airway 7.5 mm (Active)  Secured at (cm) 25 cm 10/01/2011  3:42 PM  Measured From Lips 10/01/2011  3:42 PM  Secured Location Left 10/01/2011  3:42 PM  Secured By [employer] 10/01/2011  3:42 PM  Tube Holder Repositioned Yes 10/01/2011  3:42 PM  Cuff Pressure (cm H2O) 25 cm H2O 10/01/2011  8:00 AM  Site Condition Dry 10/01/2011  3:42 PM    Evaluation  O2 sats: stable throughout Complications: No apparent complications Patient did tolerate procedure well. Bilateral Breath Sounds: Rhonchi Suctioning: Airway Yes  Ok Anis, MA 10/01/2011, 4:53 PM
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
## 2011-10-01 NOTE — Progress Notes (Signed)
Name: Troy Holden MRN: [id number] DOB: 07-23-1951    LOS: 2  PCCM FOLLOW UP NOTE  History of Present Illness: 61 yo HIV+ with lymphoma who was brought to [facility] with encephalopathy and intubated for airway protection.  Transferred to [facility] for further management.  Lines / Drains: 3/6  ETT>>> 3/8 3/6  OGT>>> 3/8 3/6  Foley>>   Cultures: 3/6  Blood >>  Antibiotics: ID following 3/6  Zosyn x 1 3/6  Vancomycin>>> 3/8 3/6  Ceftriaxone>>> 3/8 3/6  Acyclovir>>> 3/8 3/6  Amph B>>> 3/8 3/6  Flucytosine>>> 3/7 3/6  Ampicillin >>  3/8 Fluconazole >>   Tests / Events: 3/6  Brain MR>>> No acute infarct, white matter changes, sinusitis  SUBJ: + F/C on WUA. Lethargic. Looks good on vent. Passed SBT this afternoon. Extubated and looks good post extubation. Strong cough  Vital Signs: Filed Vitals:   10/01/11 1542 10/01/11 1600 10/01/11 1652 10/01/11 1700  BP: 145/80   108/70  Pulse: 87  85 81  Temp:  99 F (37.2 C)    TempSrc:  Oral    Resp: 6  11 7   Height:      Weight:      SpO2:   97% 97%    I/O last 3 completed shifts: In: 7016.3 [I.V.:2740.2; NG/GT:60; IV Piggyback:4216.1] Out: 5955 [Urine:4255; Emesis/NG output:1700]  Physical Examination: General: Lethargic, no acute distress Neuro:  No focal deficits HEENT:  Opaque right eye, pink conjunctivae, dry membranes, bitemporal wasting Neck:  Supple, no JVD   Cardiovascular:  RRR, no M/R/G Lungs:  Clear anteriorly Abdomen:  Soft, nontender, nondistended, bowel sounds present Musculoskeletal:  No pedal edema Skin:  No rash  Ventilator settings: Vent Mode:  [-] CPAP;PSV FiO2 (%):  [30 %] 30 % Set Rate:  [16 bmp] 16 bmp Vt Set:  [550 mL] 550 mL PEEP:  [5 cmH20] 5 cmH20 Pressure Support:  [5 cmH20] 5 cmH20 Plateau Pressure:  [9 cmH20-15 cmH20] 13 cmH20  Labs and Imaging:  Reviewed.  Please refer to the Assessment and Plan section for relevant results.  ASSESSMENT AND PLAN  NEUROLOGIC A:  Metabolic encephalopathy.   Doubt meningitis. Underlying HIV dementia.  No evidence of acute stroke on brain MRI.  No evidence of mass lesions.  LP failed by IR.  Toxicology positive for opiates. Now markedly improved. No further eval indicated  PULMONARY  Lab 10/01/11 0410 09/30/11 1739 09/30/11 0724 09/30/11 0330 09/29/11 1210  PHART 7.303* 7.266* 7.199* 7.257* 7.379  PCO2ART 34.3* 30.4* 31.2* 26.9* 29.6*  PO2ART 130.0* 211.0* 195.0* 191.0* 156.0*  HCO3 16.7* 13.8* 12.2* 11.6* 17.1*  O2SAT 98.7 100.0 99.0 98.9 99.1   A:  Acute respiratory failure.   Extubated today and looks good. Monitor post extubation in ICU   RENAL  Lab 10/01/11 0529 09/30/11 1858 09/30/11 1300 09/30/11 1150 09/30/11 0441 09/29/11 1055  NA 134* 132* -- 130* 128* 128*  K 3.4* 3.8 -- -- -- --  CL 104 105 -- 105 100 94*  CO2 18* 16* -- 14* 12* 18*  BUN 26* 17 -- 13 12 13   CREATININE 1.15 0.82 -- 0.38* <0.20* 0.62  CALCIUM 7.1* 7.9* -- 7.4* 7.6* 9.2  MG 2.2 -- 2.2 -- -- --  PHOS 5.2* -- -- -- -- --   A:  Hyponatremia.  Metabolic acidosis - mild. Hypokalemia P: -->  Replete K+. Follow BMETs  GASTROINTESTINAL  Lab 09/30/11 1150 09/30/11 0441 09/29/11 1055  AST 418* 258* 85*  ALT 630* 354* 107*  ALKPHOS 117 117 138*  BILITOT 0.7 0.8 0.7  PROT 6.1 6.2 7.7  ALBUMIN 3.4* 3.3* 4.3   A:  Mildly elevated liver enzymes.  P: -->  Trend -->  NPO post extubation  HEMATOLOGIC  Lab 10/01/11 0529 09/30/11 1604 09/30/11 0441 09/29/11 1055  HGB 14.1 15.2 15.8 17.2*  HCT 40.5 42.9 45.2 47.8  PLT 120* 172 114* 155  INR -- -- -- --  APTT -- -- -- --   A:  Hemoconcentration.  No overt hemorrhage.  Lymphoma by history (DLBCL). P: -->  CBC in AM  INFECTIOUS  Lab 10/01/11 0529 09/30/11 1604 09/30/11 0441 09/29/11 1107 09/29/11 1055  WBC 12.8* 10.0 7.1 -- 10.9*  PROCALCITON -- -- -- 16.39 --   A:  No clear infectious source.  HIV+.   P: -->  Abx per ID  ENDOCRINE  Lab 10/01/11 1613 10/01/11 1134 10/01/11 0750 10/01/11 0459  10/01/11 0021  GLUCAP 96 154* 206* 184* 164*   A:  Hyperglycemia, likely .  History of hypothyroidism. P: -->  CBG checks / SSI -->  Synthroid IV  --> D/C Dexamethsone  BEST PRACTICE / DISPOSITION -->  ICU status under PCCM -->  Full code -->  NPO -->  Lovenox [place] for DVT Px -->  Protonix IV for GI Px  I spoke at length with pt's brother who is HCPOA. We discussed advanced directives. I clarified that Mr Hubbert has two major end stage complications of HIV - dementia and lymphoma. He wishes to discuss matters with his sister and mother. For now remains full code  CCM X 45 mins  Billy Fischer, MD;  PCCM service; Mobile [phone]

## 2011-10-02 ENCOUNTER — Inpatient Hospital Stay (HOSPITAL_COMMUNITY): Payer: Medicaid Other

## 2011-10-02 DIAGNOSIS — R4182 Altered mental status, unspecified: Secondary | ICD-10-CM

## 2011-10-02 LAB — COMPREHENSIVE METABOLIC PANEL
ALT: 6536 U/L — ABNORMAL HIGH (ref 0–53)
AST: 2414 U/L — ABNORMAL HIGH (ref 0–37)
Albumin: 3 g/dL — ABNORMAL LOW (ref 3.5–5.2)
Alkaline Phosphatase: 122 U/L — ABNORMAL HIGH (ref 39–117)
Glucose, Bld: 105 mg/dL — ABNORMAL HIGH (ref 70–99)
Potassium: 4.4 mEq/L (ref 3.5–5.1)
Sodium: 139 mEq/L (ref 135–145)
Total Protein: 5.3 g/dL — ABNORMAL LOW (ref 6.0–8.3)

## 2011-10-02 LAB — POCT I-STAT 3, ART BLOOD GAS (G3+)
Patient temperature: 98.4
TCO2: 20 mmol/L (ref 0–100)
pH, Arterial: 7.344 — ABNORMAL LOW (ref 7.350–7.450)

## 2011-10-02 LAB — CBC
HCT: 40.8 % (ref 39.0–52.0)
MCH: 30 pg (ref 26.0–34.0)
MCHC: 35.5 g/dL (ref 30.0–36.0)
MCV: 84.3 fL (ref 78.0–100.0)
RDW: 15.6 % — ABNORMAL HIGH (ref 11.5–15.5)

## 2011-10-02 LAB — GLUCOSE, CAPILLARY
Glucose-Capillary: 108 mg/dL — ABNORMAL HIGH (ref 70–99)
Glucose-Capillary: 109 mg/dL — ABNORMAL HIGH (ref 70–99)
Glucose-Capillary: 83 mg/dL (ref 70–99)

## 2011-10-02 LAB — ACETAMINOPHEN LEVEL: Acetaminophen (Tylenol), Serum: <15 ug/mL (ref 10–30)

## 2011-10-02 LAB — SODIUM, URINE, RANDOM: Sodium, Ur: <10 mEq/L

## 2011-10-02 LAB — OSMOLALITY, URINE: Osmolality, Ur: 405 mOsm/kg (ref 390–1090)

## 2011-10-02 MED ORDER — SODIUM CHLORIDE 0.9 % IV SOLN
INTRAVENOUS | Status: DC
  Administered 2011-10-02: 21:00:00 via INTRAVENOUS

## 2011-10-02 MED ORDER — WHITE PETROLATUM GEL
Status: AC
  Filled 2011-10-02: qty 5

## 2011-10-02 MED ORDER — LEVOTHYROXINE SODIUM 100 MCG IV SOLR
56.0000 ug | Freq: Every day | INTRAVENOUS | Status: DC
  Administered 2011-10-03 – 2011-10-04 (×2): 56 ug via INTRAVENOUS
  Filled 2011-10-02 (×3): qty 2.8

## 2011-10-02 MED ORDER — HEPARIN SODIUM (PORCINE) 5000 UNIT/ML IJ SOLN
5000.0000 [IU] | Freq: Three times a day (TID) | INTRAMUSCULAR | Status: DC
  Administered 2011-10-02 – 2011-10-03 (×4): 5000 [IU] via SUBCUTANEOUS
  Filled 2011-10-02 (×6): qty 1

## 2011-10-02 MED ORDER — SODIUM CHLORIDE 0.9 % IJ SOLN
INTRAMUSCULAR | Status: AC
  Administered 2011-10-02: 3 mL via INTRAVENOUS
  Filled 2011-10-02: qty 10

## 2011-10-02 MED ORDER — LACTULOSE ENEMA
300.0000 mL | Freq: Two times a day (BID) | ORAL | Status: DC
  Administered 2011-10-02 – 2011-10-04 (×5): 300 mL via RECTAL
  Filled 2011-10-02 (×11): qty 300

## 2011-10-02 MED ORDER — FLUCONAZOLE 100MG IVPB
100.0000 mg | Freq: Every day | INTRAVENOUS | Status: DC
  Administered 2011-10-03: 100 mg via INTRAVENOUS
  Filled 2011-10-02: qty 50

## 2011-10-02 MED ORDER — SODIUM CHLORIDE 0.9 % IJ SOLN
INTRAMUSCULAR | Status: AC
  Administered 2011-10-02: 14:00:00
  Filled 2011-10-02: qty 10

## 2011-10-02 NOTE — Progress Notes (Addendum)
INFECTIOUS DISEASE PROGRESS NOTE  ID: Troy Holden is a 61 y.o. male with  Principal Problem:  *Toxic metabolic encephalopathy Active Problems:  DLBCL (diffuse large B cell lymphoma)  HIV (human immunodeficiency virus infection)  Dementia  CAD (coronary artery disease)  Hypothyroidism  Dyslipidemia  Elevated LFTs  Hyponatremia  Metabolic acidosis  Subjective: Awakens transiently, does not answer questions  Abtx:  Anti-infectives     Start     Dose/Rate Route Frequency Ordered Stop   10/02/11 1200   atovaquone (MEPRON) 750 MG/5ML suspension 1,500 mg        1,500 mg Per Tube Daily with breakfast 10/01/11 0957     10/01/11 1100   emtricitabine-tenofovir (TRUVADA) 200-300 MG per tablet 1 tablet  Status:  Discontinued        1 tablet Oral Daily 10/01/11 0957 10/01/11 1211   10/01/11 1100   raltegravir (ISENTRESS) tablet 400 mg  Status:  Discontinued        400 mg Oral 2 times daily 10/01/11 0957 10/01/11 1211   10/01/11 1100   fluconazole (DIFLUCAN) IVPB 200 mg        200 mg 100 mL/hr over 60 Minutes Intravenous Daily 10/01/11 0957     10/01/11 0000   ampicillin (OMNIPEN) 2 g in sodium chloride 0.9 % 50 mL IVPB  Status:  Discontinued        2 g 150 mL/hr over 20 Minutes Intravenous Every 4 hours 09/30/11 1855 10/01/11 1448   09/30/11 1400   acyclovir (ZOVIRAX) 800 mg in dextrose 5 % 150 mL IVPB  Status:  Discontinued        10 mg/kg  80 kg (Order-Specific) 166 mL/hr over 60 Minutes Intravenous 3 times per day 09/30/11 1136 10/01/11 1021   09/30/11 0200   flucytosine (ANCOBON) capsule 1,000 mg  Status:  Discontinued        1,000 mg Oral Every 6 hours 09/30/11 0113 09/30/11 0734   09/30/11 0115   amphotericin B (FUNGIZONE) 80 mg in dextrose 5 % 500 mL IVPB  Status:  Discontinued        1 mg/kg  80.2 kg 125 mL/hr over 4 Hours Intravenous Every 24 hours 09/30/11 0113 10/01/11 1002   09/30/11 0000   vancomycin (VANCOCIN) IVPB 1000 mg/200 mL premix  Status:  Discontinued        1,000 mg 200 mL/hr over 60 Minutes Intravenous Every 8 hours 09/29/11 1755 10/01/11 0851   09/29/11 2000   cefTRIAXone (ROCEPHIN) 2 g in dextrose 5 % 50 mL IVPB  Status:  Discontinued        2 g 100 mL/hr over 30 Minutes Intravenous Every 12 hours 09/29/11 1755 10/01/11 1021   09/29/11 1800   Ampicillin-Sulbactam (UNASYN) 3 g in sodium chloride 0.9 % 100 mL IVPB  Status:  Discontinued        3 g 100 mL/hr over 60 Minutes Intravenous Every 6 hours 09/29/11 1755 09/30/11 1855   09/29/11 1730   dextrose 5 % 600 mL with azithromycin (ZITHROMAX) 1,200 mg infusion  Status:  Discontinued        450 mL/hr  Intravenous Every 7 days 09/29/11 1720 10/01/11 1002   09/29/11 1715   acyclovir (ZOVIRAX) 905 mg in dextrose 5 % 150 mL IVPB  Status:  Discontinued        10 mg/kg  90.7 kg 168.1 mL/hr over 60 Minutes Intravenous 3 times per day 09/29/11 1709 09/30/11 1136   09/29/11 1215  piperacillin-tazobactam (ZOSYN) IVPB 3.375  g       3.375 g 12.5 mL/hr over 240 Minutes Intravenous  Once 09/29/11 1206 09/29/11 1827   09/29/11 1215   vancomycin (VANCOCIN) IVPB 1000 mg/200 mL premix        1,000 mg 200 mL/hr over 60 Minutes Intravenous  Once 09/29/11 1206 09/29/11 1606          Medications:  Scheduled:   . antiseptic oral rinse  15 mL Mouth Rinse QID  . atovaquone  1,500 mg Per Tube Q breakfast  . chlorhexidine  15 mL Mouth/Throat BID  . fluconazole (DIFLUCAN) IV  200 mg Intravenous Daily  . heparin subcutaneous  5,000 Units Subcutaneous Q8H  . insulin aspart  0-15 Units Subcutaneous Q4H  . lactulose  300 mL Rectal BID  . levothyroxine  66 mcg Intravenous QAC breakfast  . pantoprazole (PROTONIX) IV  40 mg Intravenous Q12H  . potassium chloride  40 mEq Per Tube Q4H  . sodium chloride  3 mL Intravenous Q12H  . sodium chloride      . DISCONTD: acyclovir  10 mg/kg (Order-Specific) Intravenous Q8H  . DISCONTD: amphotericin B conventional (FUNGIZONE) IV  1 mg/kg Intravenous Q24H  .  DISCONTD: ampicillin (OMNIPEN) IV  2 g Intravenous Q4H  . DISCONTD: cefTRIAXone (ROCEPHIN)  IV  2 g Intravenous Q12H  . DISCONTD: dextrose 5 % 600 mL with azithromycin (ZITHROMAX) 1,200 mg infusion   Intravenous Q7 days  . DISCONTD: emtricitabine-tenofovir  1 tablet Oral Daily  . DISCONTD: enoxaparin  40 mg Subcutaneous Q24H  . DISCONTD: raltegravir  400 mg Oral BID  . DISCONTD: sodium chloride 0.9 % 25 mL with dexamethasone (DECADRON) 14 mg infusion   Intravenous Q6H    Objective: Vital signs in last 24 hours: Temp:  [97.4 F (36.3 C)-99 F (37.2 C)] 98.3 F (36.8 C) (03/09 0806) Pulse Rate:  [58-89] 74  (03/09 0500) Resp:  [6-23] 12  (03/09 0500) BP: (102-145)/(60-80) 102/61 mmHg (03/09 0500) SpO2:  [96 %-100 %] 98 % (03/09 0500) FiO2 (%):  [30 %] 30 % (03/08 1542)   General appearance: mild distress Eyes: R eye opacified Neck: FROM, R IJ. no rigidity or resistance to movement Chest wall: chest is CTA Cardio: regular rate and rhythm GI: normal findings: bowel sounds normal and soft, non-tender Extremities: no edema  Lab Results  Basename 10/02/11 0500 10/01/11 0529  WBC 12.2* 12.8*  HGB 14.5 14.1  HCT 40.8 40.5  NA 139 134*  K 4.4 3.4*  CL 107 104  CO2 18* 18*  BUN 62* 26*  CREATININE 2.45* 1.15  GLU -- --   Liver Panel  Basename 10/02/11 0500 09/30/11 1150  PROT 5.3* 6.1  ALBUMIN 3.0* 3.4*  AST 2414* 418*  ALT 6536* 630*  ALKPHOS 122* 117  BILITOT 0.7 0.7  BILIDIR -- --  IBILI -- --   Sedimentation Rate No results found for this basename: ESRSEDRATE in the last 72 hours C-Reactive Protein No results found for this basename: CRP:2 in the last 72 hours  Microbiology: Recent Results (from the past 240 hour(s))  CULTURE, BLOOD (ROUTINE X 2)     Status: Normal (Preliminary result)   Collection Time   09/29/11 10:59 AM      Component Value Range Status Comment   Specimen Description BLOOD RIGHT ANTECUBITAL   Final    Special Requests BOTTLES DRAWN  AEROBIC ONLY 6CC   Final    Culture NO GROWTH 3 DAYS   Final    Report Status  PENDING   Incomplete   CULTURE, BLOOD (ROUTINE X 2)     Status: Normal (Preliminary result)   Collection Time   09/29/11 11:07 AM      Component Value Range Status Comment   Specimen Description BLOOD RIGHT ANTECUBITAL   Final    Special Requests BOTTLES DRAWN AEROBIC AND ANAEROBIC 6CC   Final    Culture NO GROWTH 3 DAYS   Final    Report Status PENDING   Incomplete   MRSA PCR SCREENING     Status: Normal   Collection Time   09/29/11  5:45 PM      Component Value Range Status Comment   MRSA by PCR NEGATIVE  NEGATIVE  Final     Studies/Results: Dg Chest Port 1 View  10/02/2011  *RADIOLOGY REPORT*  Clinical Data: Respiratory failure  PORTABLE CHEST - 1 VIEW  Comparison: 10/02/2011, 0026 hours  Findings: Stable positioning of central line.  Mild atelectasis at both lung bases.  No edema, infiltrate or pleural fluid identified. Heart size is normal.  No pneumothorax.  IMPRESSION: Mild bibasilar atelectasis.  Original Report Authenticated By: Reola Calkins, M.D.   Dg Chest Port 1 View  10/02/2011  *RADIOLOGY REPORT*  Clinical Data: Interval central line placement  PORTABLE CHEST - 1 VIEW  Comparison: 10/01/2011  Findings: Right IJ catheter tip projects over the proximal SVC. This line was present previously.  Interval extubation.  Heart size and lungs are stable.  No pneumothorax.  No acute osseous abnormality.  IMPRESSION: Right IJ catheter tip projects over the proximal SVC.  No interval central lines identified.  Original Report Authenticated By: Waneta Martins, M.D.   Dg Chest Port 1 View  10/01/2011  *RADIOLOGY REPORT*  Clinical Data: Intubation  PORTABLE CHEST - 1 VIEW  Comparison: Portable exam 0539 hours compared to 09/30/2011  Findings: Tip of endotracheal tube 5.9 cm above carina. Right jugular central venous catheter, tip projects over SVC. Nasogastric extends into stomach. Normal heart size, mediastinal  contours, and pulmonary vascularity. Atelectasis versus infiltrate at both lung bases greater on right. Remaining lungs clear. Question underlying emphysematous changes. No pneumothorax.  IMPRESSION: Satisfactory line and tube positions. Mild atelectasis versus infiltrate at lung bases.  Original Report Authenticated By: Lollie Marrow, M.D.   Dg Chest Portable 1 View  09/30/2011  *RADIOLOGY REPORT*  Clinical Data: 61 year old male central line placement.  PORTABLE CHEST - 1 VIEW  Comparison: 0245 hours the same day and earlier.  Findings: AP portable semi upright view 1155 hours.  Right IJ approach central line placed.  Tip just above the level of the carina.  Endotracheal tube tip in good position at the level of clavicles.  Enteric tube courses to the abdomen, tip not included. No pneumothorax, pulmonary edema, pleural effusion or confluent pulmonary opacity.  IMPRESSION: 1.  Right IJ central line.  Tip at the level of the SVC.  No pneumothorax. 2.  Enteric tube placed courses to the abdomen. 3. No acute cardiopulmonary abnormality.  Original Report Authenticated By: Harley Hallmark, M.D.     Assessment/Plan: AIDS Encephalopathy ARF Hepatitis NHL Fluconazole Would consider d/c droplet.  Recheck CD4 if not done this adm (his prev makes crypto unlikely). Not clear what has caused his encephalopathy, ARF, hepatitis. Will check hepatitis studies. His antiretroviral rx could have caused his ARF (tenofovir, but rare to cause acutely). Also, his isentress is hepatically metabolized but this level of liver dysfunction would be unusual. He is off his ART now.  [name] Infectious Diseases [phone] 10/02/2011, 9:41 AM

## 2011-10-02 NOTE — Progress Notes (Signed)
Name: Troy Holden MRN: [id number] DOB: 1951/05/24    LOS: 3  PCCM FOLLOW UP NOTE  History of Present Illness: 61 yo HIV+ with lymphoma who was brought to [facility] with encephalopathy and intubated for airway protection.  Transferred to [facility] for further management.  Lines / Drains: 3/6  ETT>>> 3/8 3/6  OGT>>> 3/8 3/6  Foley>>   Cultures: 3/6  Blood >>  Antibiotics: ID following 3/6  Zosyn x 1 3/6  Vancomycin>>> 3/8 3/6  Ceftriaxone>>> 3/8 3/6  Acyclovir>>> 3/8 3/6  Amph B>>> 3/8 3/6  Flucytosine>>> 3/7 3/6  Ampicillin >>  3/8 Fluconazole >>   Tests / Events: 3/6  Brain MR>>> No acute infarct, white matter changes, sinusitis  SUBJ: Extubated, remains with poor neurostatus  Vital Signs: Filed Vitals:   10/02/11 0400 10/02/11 0417 10/02/11 0500 10/02/11 0806  BP: 107/61  102/61   Pulse: 75  74   Temp:  98.1 F (36.7 C)  98.3 F (36.8 C)  TempSrc:  Axillary  Oral  Resp: 9  12   Height:      Weight:      SpO2: 97%  98%     I/O last 3 completed shifts: In: 3425 [I.V.:1933; NG/GT:60; IV Piggyback:1432] Out: 2625 [Urine:1425; Emesis/NG output:1200]  Physical Examination: General: Lethargic, no acute distress Neuro:  No focal deficits, arouses to pain stimuli HEENT:  Opaque right eye, pink conjunctivae, dry membranes, bitemporal wasting Neck:  Supple, same JVD   Cardiovascular:  RRR, no M/R/G Lungs:  Clear anteriorly Abdomen:  Soft, nontender, nondistended, bowel sounds present Musculoskeletal:  No pedal edema Skin:  No rash  Ventilator settings: Vent Mode:  [-] CPAP;PSV FiO2 (%):  [30 %] 30 % Set Rate:  [16 bmp] 16 bmp Vt Set:  [550 mL] 550 mL PEEP:  [5 cmH20] 5 cmH20 Pressure Support:  [5 cmH20] 5 cmH20 Plateau Pressure:  [13 cmH20] 13 cmH20  Labs and Imaging:  Reviewed.  Please refer to the Assessment and Plan section for relevant results.  ASSESSMENT AND PLAN  NEUROLOGIC A:  Metabolic encephalopathy.  Doubt meningitis. Underlying HIV dementia.  No  evidence of acute stroke on brain MRI.  No evidence of mass lesions.  LP failed by IR.  Toxicology positive for opiates. Now markedly improved. Follow trend Consider lactulose, se lFt trend Avoid sedation  PULMONARY  Lab 10/01/11 2359 10/01/11 0410 09/30/11 1739 09/30/11 0724 09/30/11 0330  PHART 7.344* 7.303* 7.266* 7.199* 7.257*  PCO2ART 34.6* 34.3* 30.4* 31.2* 26.9*  PO2ART 95.0 130.0* 211.0* 195.0* 191.0*  HCO3 18.9* 16.7* 13.8* 12.2* 11.6*  O2SAT 97.0 98.7 100.0 99.0 98.9   A:  Acute respiratory failure.   Extubated  Is when able Need to d/w family re intubation status pcxr unchanged  RENAL  Lab 10/02/11 0500 10/01/11 0529 09/30/11 1858 09/30/11 1300 09/30/11 1150 09/30/11 0441  NA 139 134* 132* -- 130* 128*  K 4.4 3.4* -- -- -- --  CL 107 104 105 -- 105 100  CO2 18* 18* 16* -- 14* 12*  BUN 62* 26* 17 -- 13 12  CREATININE 2.45* 1.15 0.82 -- 0.38* <0.20*  CALCIUM 7.8* 7.1* 7.9* -- 7.4* 7.6*  MG -- 2.2 -- 2.2 -- --  PHOS -- 5.2* -- -- -- --   A:  Hyponatremia.  Metabolic acidosis - mild. Hypokalemia Acute renal failure P: -->  R/o ATN, assess volume status cvp, urine na, osm, UA,  Assess renal US Review meds, prior ampho? Saline at 75 for now  GASTROINTESTINAL  Lab 10/02/11 0500 09/30/11 1150 09/30/11 0441 09/29/11 1055  AST 2414* 418* 258* 85*  ALT 6536* 630* 354* 107*  ALKPHOS 122* 117 117 138*  BILITOT 0.7 0.7 0.8 0.7  PROT 5.3* 6.1 6.2 7.7  ALBUMIN 3.0* 3.4* 3.3* 4.3   A:  Mildly elevated liver enzymes. Now worsening P: -->  Trend -->  NPO post extubation Acute hep panel Follow bili trend May need imaging, order renal US  HEMATOLOGIC  Lab 10/02/11 0500 10/01/11 0529 09/30/11 1604 09/30/11 0441 09/29/11 1055  HGB 14.5 14.1 15.2 15.8 17.2*  HCT 40.8 40.5 42.9 45.2 47.8  PLT PLATELET CLUMPS NOTED ON SMEAR, COUNT APPEARS DECREASED 120* 172 114* 155  INR -- -- -- -- --  APTT -- -- -- -- --   A:  Hemoconcentration.  No overt hemorrhage.  Lymphoma by  history (DLBCL). P: -->  CBC in AM ARF dc lov, add sub q hep  INFECTIOUS  Lab 10/02/11 0500 10/01/11 0529 09/30/11 1604 09/30/11 0441 09/29/11 1107 09/29/11 1055  WBC 12.2* 12.8* 10.0 7.1 -- 10.9*  PROCALCITON -- -- -- -- 16.39 --   A:  No clear infectious source.  HIV+.   P: -->  Abx per ID May need to dose adjust diflucan, see lFT  ENDOCRINE  Lab 10/02/11 0758 10/02/11 0406 10/02/11 0031 10/01/11 2109 10/01/11 1613  GLUCAP 111* 124* 83 86 96   A:  Hyperglycemia, likely .  History of hypothyroidism. P: -->  CBG checks / SSI -->  Synthroid IV   BEST PRACTICE / DISPOSITION -->  ICU status under PCCM -->  Full code -->  NPO -->  Lovenox Tangent for DVT Px -->  Protonix IV for GI Px  Mcarthur Rossetti. Tyson Alias, MD, FACP Pgr: [phone] [facility]

## 2011-10-03 ENCOUNTER — Inpatient Hospital Stay (HOSPITAL_COMMUNITY): Payer: Medicaid Other

## 2011-10-03 DIAGNOSIS — R75 Inconclusive laboratory evidence of human immunodeficiency virus [HIV]: Secondary | ICD-10-CM

## 2011-10-03 DIAGNOSIS — J9600 Acute respiratory failure, unspecified whether with hypoxia or hypercapnia: Secondary | ICD-10-CM

## 2011-10-03 DIAGNOSIS — G01 Meningitis in bacterial diseases classified elsewhere: Secondary | ICD-10-CM

## 2011-10-03 DIAGNOSIS — E782 Mixed hyperlipidemia: Secondary | ICD-10-CM

## 2011-10-03 LAB — COMPREHENSIVE METABOLIC PANEL
ALT: 3500 U/L — ABNORMAL HIGH (ref 0–53)
Albumin: 2.7 g/dL — ABNORMAL LOW (ref 3.5–5.2)
Calcium: 7.7 mg/dL — ABNORMAL LOW (ref 8.4–10.5)
GFR calc Af Amer: 32 mL/min — ABNORMAL LOW (ref >90)
Glucose, Bld: 131 mg/dL — ABNORMAL HIGH (ref 70–99)
Sodium: 145 mEq/L (ref 135–145)
Total Protein: 5 g/dL — ABNORMAL LOW (ref 6.0–8.3)

## 2011-10-03 LAB — DIFFERENTIAL
Basophils Relative: 0 % (ref 0–1)
Lymphocytes Relative: 2 % — ABNORMAL LOW (ref 12–46)
Monocytes Absolute: 0 10*3/uL — ABNORMAL LOW (ref 0.1–1.0)
Monocytes Relative: 0 % — ABNORMAL LOW (ref 3–12)
Neutro Abs: 6.1 10*3/uL (ref 1.7–7.7)

## 2011-10-03 LAB — GLUCOSE, CAPILLARY
Glucose-Capillary: 125 mg/dL — ABNORMAL HIGH (ref 70–99)
Glucose-Capillary: 128 mg/dL — ABNORMAL HIGH (ref 70–99)
Glucose-Capillary: 141 mg/dL — ABNORMAL HIGH (ref 70–99)
Glucose-Capillary: 139 mg/dL — ABNORMAL HIGH (ref 70–99)

## 2011-10-03 LAB — HEPATITIS PANEL, ACUTE: Hep A IgM: NEGATIVE

## 2011-10-03 LAB — CBC
HCT: 35.1 % — ABNORMAL LOW (ref 39.0–52.0)
Hemoglobin: 12.5 g/dL — ABNORMAL LOW (ref 13.0–17.0)
MCHC: 35.6 g/dL (ref 30.0–36.0)

## 2011-10-03 MED ORDER — FENTANYL CITRATE 0.05 MG/ML IJ SOLN
12.5000 ug | Freq: Once | INTRAMUSCULAR | Status: AC
  Administered 2011-10-03: 12.5 ug via INTRAVENOUS
  Filled 2011-10-03: qty 2

## 2011-10-03 MED ORDER — SODIUM CHLORIDE 0.45 % IV SOLN
INTRAVENOUS | Status: DC
  Administered 2011-10-03 – 2011-10-04 (×2): via INTRAVENOUS

## 2011-10-03 NOTE — Progress Notes (Signed)
eLink Physician-Brief Progress Note Patient Name: Troy Holden DOB: 1950/10/14 MRN: [id number]  Date of Service  10/03/2011   HPI/Events of Note   RN says general pain. Was lethargic but not now. Liver failure +  eICU Interventions  D/with Dr Tyson Alias bedside MD   will give 12.60mcg fentanylx 1    Intervention Category Intermediate Interventions: Pain - evaluation and management  Lennie Dunnigan 10/03/2011, 3:52 PM

## 2011-10-03 NOTE — Progress Notes (Signed)
PT Cancellation Note  Treatment cancelled today due to medical issues with patient which prohibited therapy. Pt has been unresponsive with RN. Will attempt evaluation tomorrow pending medical stability.  Thanks, 10/03/2011 Milana Kidney DPT PAGER: [phone] OFFICE: [phone]    Milana Kidney 10/03/2011, 3:32 PM

## 2011-10-03 NOTE — Evaluation (Signed)
Clinical/Bedside Swallow Evaluation Patient Details  Name: Troy Holden MRN: [id number] DOB: 1951-01-18 Today's Date: 10/03/2011  Past Medical History:  Past Medical History  Diagnosis Date  . Heart disease   . Cancer     lymphoma  . HIV (human immunodeficiency virus infection)   . Anemia   . Anxiety   . Blood transfusion   . Cataract   . Depression   . GERD (gastroesophageal reflux disease)   . Thyroid disease     low thyroid  . 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 HIV (human immunodeficiency virus infection) 04/20/2011  . Dementia 04/20/2011  . CAD (coronary artery disease) 04/20/2011  . Hypothyroidism 04/20/2011  . Dyslipidemia 04/20/2011   Past Surgical History:  Past Surgical History  Procedure Date  . Cardiac surgery   . Abdominal surgery    HPI:   61 y/o male who was [facility] to [facility] ED [facility] on 09-[facility]-11 with AMS. Patient intubated for airway protection and transferred to [facility] for further management. MRI completed on 09-29-11 indicates no acute infarct.  Patient extubated on 10-01-11.  No prior reports of dysphagia in e-chart. Patient's brother contacted by phone on this date by treating SLP.  Brother reports patient has a history of "choking" on solids and was admitted to [facility] "last year" due to food "lodged into his trachea" .  Patient's brother unaware of patient having prior swallow evaluations and stated patient was on regular consistency diet and thin liquids at SNF.   Assessment/Recommendations/Treatment Plan    SLP Assessment Clinical Impression Statement: Severe oropharyngeal dysphagia marked by decreased laryngeal closure resulting in +s/s of aspiration during and s/p swallow.  Evaluation limited  secondary to patient with "clamp down" bite with trials of  thin water and puree by spoon. Patient presents with lethargy but would awaken to limited periods of time.  Patient not appropriate for PO  readiness secondary to s/s of aspiration with PO trials or appropriate for completion of  MBS at this time secondary to menation.  ST to follow on 10-04-11 to reassess swallow for possible PO intake and for POC.   Risk for Aspiration: Severe Other Related Risk Factors: History of dysphagia;Lethargy;Cognitive impairment  Swallow Evaluation Recommendations General Recommendation: NPO;Alternative means - temporary  Treatment Plan Speech Therapy Frequency: min 2x/week Treatment Duration: 2 weeks Interventions: Aspiration precaution training;Trials of upgraded texture/liquids;Patient/family education  Prognosis Prognosis for Safe Diet Advancement: Fair Barriers to Reach Goals: Cognitive deficits;Severity of dysphagia  Individuals Consulted Consulted and Agree with Results and Recommendations: RN  Swallowing Goals  SLP Swallowing Goals Goal #3: Patient will consume PO trials of various consistencies w/o observed s/s of aspiration  with moderate assist administered by SLP only.   General  Date of Onset: 09/29/11 Type of Study: Bedside swallow evaluation Diet Prior to this Study: NPO Temperature Spikes Noted: No History of Intubation: Yes Length of Intubations (days): 1 days Date extubated: 10/01/11 Behavior/Cognition: Lethargic;Requires cueing;Doesn't follow directions Oral Cavity - Dentition: Poor condition Patient Positioning: Upright in bed Baseline Vocal Quality: Wet Volitional Cough: Cognitively unable to elicit Volitional Swallow: Unable to elicit  Oral Motor/Sensory Function  Overall Oral Motor/Sensory Function: Appears within functional limits for tasks assessed  Consistency Results  Ice Chips Ice chips: Impaired Pharyngeal Phase Impairments: Delayed Swallow;Decreased hyoid-laryngeal movement;Cough - Delayed;Throat Clearing - Delayed  Thin Liquid Thin Liquid: Impaired Presentation: Spoon Pharyngeal  Phase Impairments: Delayed Swallow;Decreased hyoid-laryngeal  movement;Multiple swallows;Wet Vocal Quality;Cough - Immediate  Nectar Thick Liquid Nectar Thick Liquid: Not tested  Honey Thick Liquid Honey Thick Liquid: Not tested  Puree Puree: Impaired Presentation: Spoon Pharyngeal Phase Impairments: Delayed Swallow;Decreased hyoid-laryngeal movement;Throat Clearing - Delayed;Wet Vocal Quality Other Comments: "clamp dowin bite" on spoon  Solid Solid: Not tested Moreen Fowler M.S., CCC-SLP [phone]  [facility] 10/03/2011,1:18 PM

## 2011-10-03 NOTE — Progress Notes (Signed)
Name: Troy Holden MRN: [id number] DOB: Jul 02, 1951    LOS: 4  PCCM FOLLOW UP NOTE  History of Present Illness: 61 yo HIV+ with lymphoma who was brought to [facility] with encephalopathy and intubated for airway protection.  Transferred to [facility] for further management.  Lines / Drains: 3/6  ETT>>> 3/8 3/6  OGT>>> 3/8 3/6  Foley>>   Cultures: 3/6  Blood >>  Antibiotics: ID following 3/6  Zosyn x 1 3/6  Vancomycin>>> 3/8 3/6  Ceftriaxone>>> 3/8 3/6  Acyclovir>>> 3/8 3/6  Amph B>>> 3/8 3/6  Flucytosine>>> 3/7 3/6  Ampicillin >> off 3/8 Fluconazole >>  3/9 Atovaquone>>>  Tests / Events: 3/6  Brain MR>>> No acute infarct, white matter changes, sinusitis 3/9 improved neurostatus, coughing  SUBJ: Neurostatus improved  Vital Signs: Filed Vitals:   10/03/11 0600 10/03/11 0700 10/03/11 0740 10/03/11 0800  BP: 125/71 126/72  125/69  Pulse: 77 76  73  Temp:   98.3 F (36.8 C)   TempSrc:   Oral   Resp: 19 18  22   Height:      Weight:      SpO2: 97% 97%  97%    I/O last 3 completed shifts: In: 2760 [I.V.:1650; Other:1000; IV Piggyback:110] Out: 1915 [Urine:1290; Emesis/NG output:125; Stool:500]  Physical Examination: General: no distress, awake, talkative, oirented HEENT:  Opaque right eye, pink conjunctivae, dry membranes, bitemporal wasting Neck:  Supple, same JVD   Cardiovascular:  RRR, no M/R/G Lungs:  Clear anteriorly, scatttered ronchi mild Abdomen:  Soft, nontender, nondistended, bowel sounds present Musculoskeletal:  No pedal edema Skin:  No rash  Ventilator settings:    Labs and Imaging:  Reviewed.  Please refer to the Assessment and Plan section for relevant results.  ASSESSMENT AND PLAN  NEUROLOGIC A:  Metabolic encephalopathy.  Doubt meningitis. Underlying HIV dementia.  No evidence of acute stroke on brain MRI.  No evidence of mass lesions.  LP failed by IR.  Toxicology positive for opiates. Resolving rapidly  Improved with lactulose, se lFt trend Avoid  sedation Treat hepatic enceph Pt slp  PULMONARY  Lab 10/01/11 2359 10/01/11 0410 09/30/11 1739 09/30/11 0724 09/30/11 0330  PHART 7.344* 7.303* 7.266* 7.199* 7.257*  PCO2ART 34.6* 34.3* 30.4* 31.2* 26.9*  PO2ART 95.0 130.0* 211.0* 195.0* 191.0*  HCO3 18.9* 16.7* 13.8* 12.2* 11.6*  O2SAT 97.0 98.7 100.0 99.0 98.9   A:  Acute respiratory failure.   Is when able Need to d/w family re intubation status  RENAL  Lab 10/03/11 0458 10/02/11 0500 10/01/11 0529 09/30/11 1858 09/30/11 1300 09/30/11 1150  NA 145 139 134* 132* -- 130*  K 3.8 4.4 -- -- -- --  CL 113* 107 104 105 -- 105  CO2 21 18* 18* 16* -- 14*  BUN 76* 62* 26* 17 -- 13  CREATININE 2.40* 2.45* 1.15 0.82 -- 0.38*  CALCIUM 7.7* 7.8* 7.1* 7.9* -- 7.4*  MG -- -- 2.2 -- 2.2 --  PHOS -- -- 5.2* -- -- --   A:  Hyponatremia.  Metabolic acidosis - mild. Hypokalemia Acute renal failure slow resolution P: -->  R/o ATN, urine studies indicate pre renal Assess renal US-pending Review meds, prior ampho? Cl rising, to 1/2 NS, maintain, chem in am   GASTROINTESTINAL  Lab 10/03/11 0458 10/02/11 0500 09/30/11 1150 09/30/11 0441 09/29/11 1055  AST 334* 2414* 418* 258* 85*  ALT 3500* 6536* 630* 354* 107*  ALKPHOS 99 122* 117 117 138*  BILITOT 1.2 0.7 0.7 0.8 0.7  PROT 5.0* 5.3*  6.1 6.2 7.7  ALBUMIN 2.7* 3.0* 3.4* 3.3* 4.3   A:  Mildly elevated liver enzymes. Now trending down, unclear P: -->  Trend again in am  -->  NPO post extubation Acute hep panel-neg thus far Follow bili trend as ast down , will rise Swallow eval ID addressing retroviral meds as cause  HEMATOLOGIC  Lab 10/03/11 0458 10/02/11 0500 10/01/11 0529 09/30/11 1604 09/30/11 0441  HGB 12.5* 14.5 14.1 15.2 15.8  HCT 35.1* 40.8 40.5 42.9 45.2  PLT 55* PLATELET CLUMPS NOTED ON SMEAR, COUNT APPEARS DECREASED 120* 172 114*  INR -- -- -- -- --  APTT -- -- -- -- --   A:  Hemoconcentration.  No overt hemorrhage.  Lymphoma by history (DLBCL). P: -->  CBC in  AM  sub q hep  INFECTIOUS  Lab 10/03/11 0458 10/02/11 0500 10/01/11 0529 09/30/11 1604 09/30/11 0441 09/29/11 1107  WBC 6.2 12.2* 12.8* 10.0 7.1 --  PROCALCITON -- -- -- -- -- 16.39   A:  No clear infectious source.  HIV+.   P: -->  Abx per ID lft downward  ENDOCRINE  Lab 10/03/11 0719 10/03/11 0312 10/03/11 0013 10/02/11 1957 10/02/11 1540  GLUCAP 128* 125* 93 121* 108*   A:  Hyperglycemia, likely .  History of hypothyroidism. P: -->  CBG checks / SSI -->  Synthroid IV   BEST PRACTICE / DISPOSITION -->  ICU status under PCCM 3/10 -->  Full code -->  NPO -->  sub q hep -->  Protonix IV for GI Px  To triad and floor Will sign off call if needed Still need code status discusion  Mcarthur Rossetti. Tyson Alias, MD, FACP Pgr: [phone] [facility]

## 2011-10-03 NOTE — Progress Notes (Signed)
INFECTIOUS DISEASE PROGRESS NOTE  ID: Troy Holden is a 61 y.o. male with  Principal Problem:  *Toxic metabolic encephalopathy Active Problems:  DLBCL (diffuse large B cell lymphoma)  HIV (human immunodeficiency virus infection)  Dementia  CAD (coronary artery disease)  Hypothyroidism  Dyslipidemia  Elevated LFTs  Hyponatremia  Metabolic acidosis  Subjective: Per NSG awakens, knows his name.   Abtx:  Anti-infectives     Start     Dose/Rate Route Frequency Ordered Stop   10/03/11 1000   fluconazole (DIFLUCAN) IVPB 100 mg        100 mg 50 mL/hr over 60 Minutes Intravenous Daily 10/02/11 1349     10/02/11 1200   atovaquone (MEPRON) 750 MG/5ML suspension 1,500 mg        1,500 mg Per Tube Daily with breakfast 10/01/11 0957     10/01/11 1100   emtricitabine-tenofovir (TRUVADA) 200-300 MG per tablet 1 tablet  Status:  Discontinued        1 tablet Oral Daily 10/01/11 0957 10/01/11 1211   10/01/11 1100   raltegravir (ISENTRESS) tablet 400 mg  Status:  Discontinued        400 mg Oral 2 times daily 10/01/11 0957 10/01/11 1211   10/01/11 1100   fluconazole (DIFLUCAN) IVPB 200 mg  Status:  Discontinued        200 mg 100 mL/hr over 60 Minutes Intravenous Daily 10/01/11 0957 10/02/11 1349   10/01/11 0000   ampicillin (OMNIPEN) 2 g in sodium chloride 0.9 % 50 mL IVPB  Status:  Discontinued        2 g 150 mL/hr over 20 Minutes Intravenous Every 4 hours 09/30/11 1855 10/01/11 1448   09/30/11 1400   acyclovir (ZOVIRAX) 800 mg in dextrose 5 % 150 mL IVPB  Status:  Discontinued        10 mg/kg  80 kg (Order-Specific) 166 mL/hr over 60 Minutes Intravenous 3 times per day 09/30/11 1136 10/01/11 1021   09/30/11 0200   flucytosine (ANCOBON) capsule 1,000 mg  Status:  Discontinued        1,000 mg Oral Every 6 hours 09/30/11 0113 09/30/11 0734   09/30/11 0115   amphotericin B (FUNGIZONE) 80 mg in dextrose 5 % 500 mL IVPB  Status:  Discontinued        1 mg/kg  80.2 kg 125 mL/hr over 4  Hours Intravenous Every 24 hours 09/30/11 0113 10/01/11 1002   09/30/11 0000   vancomycin (VANCOCIN) IVPB 1000 mg/200 mL premix  Status:  Discontinued        1,000 mg 200 mL/hr over 60 Minutes Intravenous Every 8 hours 09/29/11 1755 10/01/11 0851   09/29/11 2000   cefTRIAXone (ROCEPHIN) 2 g in dextrose 5 % 50 mL IVPB  Status:  Discontinued        2 g 100 mL/hr over 30 Minutes Intravenous Every 12 hours 09/29/11 1755 10/01/11 1021   09/29/11 1800   Ampicillin-Sulbactam (UNASYN) 3 g in sodium chloride 0.9 % 100 mL IVPB  Status:  Discontinued        3 g 100 mL/hr over 60 Minutes Intravenous Every 6 hours 09/29/11 1755 09/30/11 1855   09/29/11 1730   dextrose 5 % 600 mL with azithromycin (ZITHROMAX) 1,200 mg infusion  Status:  Discontinued        450 mL/hr  Intravenous Every 7 days 09/29/11 1720 10/01/11 1002   09/29/11 1715   acyclovir (ZOVIRAX) 905 mg in dextrose 5 % 150 mL IVPB  Status:  Discontinued        10 mg/kg  90.7 kg 168.1 mL/hr over 60 Minutes Intravenous 3 times per day 09/29/11 1709 09/30/11 1136   09/29/11 1215  piperacillin-tazobactam (ZOSYN) IVPB 3.375 g       3.375 g 12.5 mL/hr over 240 Minutes Intravenous  Once 09/29/11 1206 09/29/11 1827   09/29/11 1215   vancomycin (VANCOCIN) IVPB 1000 mg/200 mL premix        1,000 mg 200 mL/hr over 60 Minutes Intravenous  Once 09/29/11 1206 09/29/11 1606          Medications:  Scheduled:   . antiseptic oral rinse  15 mL Mouth Rinse QID  . atovaquone  1,500 mg Per Tube Q breakfast  . chlorhexidine  15 mL Mouth/Throat BID  . fluconazole (DIFLUCAN) IV  100 mg Intravenous Daily  . heparin subcutaneous  5,000 Units Subcutaneous Q8H  . insulin aspart  0-15 Units Subcutaneous Q4H  . lactulose  300 mL Rectal BID  . levothyroxine  56 mcg Intravenous Daily  . pantoprazole (PROTONIX) IV  40 mg Intravenous Q12H  . sodium chloride  3 mL Intravenous Q12H  . sodium chloride      . white petrolatum      . DISCONTD: fluconazole  (DIFLUCAN) IV  200 mg Intravenous Daily  . DISCONTD: levothyroxine  66 mcg Intravenous QAC breakfast    Objective: Vital signs in last 24 hours: Temp:  [98 F (36.7 C)-98.4 F (36.9 C)] 98.3 F (36.8 C) (03/10 0740) Pulse Rate:  [67-92] 67  (03/10 1000) Resp:  [11-25] 15  (03/10 1000) BP: (108-157)/(65-93) 116/65 mmHg (03/10 1000) SpO2:  [94 %-100 %] 97 % (03/10 1000) Weight:  [82.9 kg (182 lb 12.2 oz)] 82.9 kg (182 lb 12.2 oz) (03/10 0500)   General appearance: awakens minimally Resp: clear to auscultation bilaterally Cardio: regular rate and rhythm GI: normal findings: bowel sounds normal and soft, non-tender Extremities: edema none  Lab Results  Basename 10/03/11 0458 10/02/11 0500  WBC 6.2 12.2*  HGB 12.5* 14.5  HCT 35.1* 40.8  NA 145 139  K 3.8 4.4  CL 113* 107  CO2 21 18*  BUN 76* 62*  CREATININE 2.40* 2.45*  GLU -- --   Liver Panel  Basename 10/03/11 0458 10/02/11 0500  PROT 5.0* 5.3*  ALBUMIN 2.7* 3.0*  AST 334* 2414*  ALT 3500* 6536*  ALKPHOS 99 122*  BILITOT 1.2 0.7  BILIDIR -- --  IBILI -- --   Sedimentation Rate No results found for this basename: ESRSEDRATE in the last 72 hours C-Reactive Protein No results found for this basename: CRP:2 in the last 72 hours  Microbiology: Recent Results (from the past 240 hour(s))  CULTURE, BLOOD (ROUTINE X 2)     Status: Normal (Preliminary result)   Collection Time   09/29/11 10:59 AM      Component Value Range Status Comment   Specimen Description BLOOD RIGHT ANTECUBITAL   Final    Special Requests BOTTLES DRAWN AEROBIC ONLY 6CC   Final    Culture NO GROWTH 4 DAYS   Final    Report Status PENDING   Incomplete   CULTURE, BLOOD (ROUTINE X 2)     Status: Normal (Preliminary result)   Collection Time   09/29/11 11:07 AM      Component Value Range Status Comment   Specimen Description BLOOD RIGHT ANTECUBITAL   Final    Special Requests BOTTLES DRAWN AEROBIC AND ANAEROBIC 6CC   Final  Culture NO GROWTH 4  DAYS   Final    Report Status PENDING   Incomplete   MRSA PCR SCREENING     Status: Normal   Collection Time   09/29/11  5:45 PM      Component Value Range Status Comment   MRSA by PCR NEGATIVE  NEGATIVE  Final     Studies/Results: Dg Chest Port 1 View  10/02/2011  *RADIOLOGY REPORT*  Clinical Data: Respiratory failure  PORTABLE CHEST - 1 VIEW  Comparison: 10/02/2011, 0026 hours  Findings: Stable positioning of central line.  Mild atelectasis at both lung bases.  No edema, infiltrate or pleural fluid identified. Heart size is normal.  No pneumothorax.  IMPRESSION: Mild bibasilar atelectasis.  Original Report Authenticated By: Reola Calkins, M.D.   Dg Chest Port 1 View  10/02/2011  *RADIOLOGY REPORT*  Clinical Data: Interval central line placement  PORTABLE CHEST - 1 VIEW  Comparison: 10/01/2011  Findings: Right IJ catheter tip projects over the proximal SVC. This line was present previously.  Interval extubation.  Heart size and lungs are stable.  No pneumothorax.  No acute osseous abnormality.  IMPRESSION: Right IJ catheter tip projects over the proximal SVC.  No interval central lines identified.  Original Report Authenticated By: Waneta Martins, M.D.     Assessment/Plan: Hepatitis ARF AIDS LFTs improving. Cr slightly better. His viral hepatitis studies are (-) so far CD4 and VL are pending.  Will d/c diflucan. Will stop his diflucan, try to minimize hepatotoxic meds Etiology of his syndrome is unclear.   [name] Infectious Diseases [phone] 10/03/2011, 10:13 AM

## 2011-10-04 LAB — COMPREHENSIVE METABOLIC PANEL
ALT: 1799 U/L — ABNORMAL HIGH (ref 0–53)
AST: 70 U/L — ABNORMAL HIGH (ref 0–37)
Albumin: 2.7 g/dL — ABNORMAL LOW (ref 3.5–5.2)
Alkaline Phosphatase: 112 U/L (ref 39–117)
Potassium: 3.2 mEq/L — ABNORMAL LOW (ref 3.5–5.1)
Sodium: 152 mEq/L — ABNORMAL HIGH (ref 135–145)
Total Protein: 4.9 g/dL — ABNORMAL LOW (ref 6.0–8.3)

## 2011-10-04 LAB — T-HELPER CELLS (CD4) COUNT (NOT AT ARMC): CD4 % Helper T Cell: 18 % — ABNORMAL LOW (ref 33–55)

## 2011-10-04 LAB — CULTURE, BLOOD (ROUTINE X 2): Culture: NO GROWTH

## 2011-10-04 LAB — GLUCOSE, CAPILLARY
Glucose-Capillary: 118 mg/dL — ABNORMAL HIGH (ref 70–99)
Glucose-Capillary: 98 mg/dL (ref 70–99)

## 2011-10-04 MED ORDER — FENTANYL CITRATE 0.05 MG/ML IJ SOLN
12.5000 ug | Freq: Once | INTRAMUSCULAR | Status: AC
  Administered 2011-10-04: 12.5 ug via INTRAVENOUS
  Filled 2011-10-04: qty 2

## 2011-10-04 MED ORDER — POTASSIUM CHLORIDE 10 MEQ/100ML IV SOLN
10.0000 meq | INTRAVENOUS | Status: AC
  Administered 2011-10-04 (×3): 10 meq via INTRAVENOUS
  Filled 2011-10-04 (×3): qty 100

## 2011-10-04 NOTE — Progress Notes (Signed)
INFECTIOUS DISEASE PROGRESS NOTE  ID: Troy Holden is a 61 y.o. male with  Principal Problem:  *Toxic metabolic encephalopathy Active Problems:  DLBCL (diffuse large B cell lymphoma)  HIV (human immunodeficiency virus infection)  Dementia  CAD (coronary artery disease)  Hypothyroidism  Dyslipidemia  Elevated LFTs  Hyponatremia  Metabolic acidosis  Subjective: Awake, answers questions appropriately.  Does not know why he has been on fluconazole.  No history of meningitis.  He states that he has been on his ARV for about 1 year, no history of resistance.  Last chemo in October.  Normally walks, talks normally.  Denies any recent ingestions.    Abtx:  Anti-infectives     Start     Dose/Rate Route Frequency Ordered Stop   10/03/11 1000   fluconazole (DIFLUCAN) IVPB 100 mg  Status:  Discontinued        100 mg 50 mL/hr over 60 Minutes Intravenous Daily 10/02/11 1349 10/03/11 1023   10/02/11 1200   atovaquone (MEPRON) 750 MG/5ML suspension 1,500 mg  Status:  Discontinued        1,500 mg Per Tube Daily with breakfast 10/01/11 0957 10/04/11 0920   10/01/11 1100   emtricitabine-tenofovir (TRUVADA) 200-300 MG per tablet 1 tablet  Status:  Discontinued        1 tablet Oral Daily 10/01/11 0957 10/01/11 1211   10/01/11 1100   raltegravir (ISENTRESS) tablet 400 mg  Status:  Discontinued        400 mg Oral 2 times daily 10/01/11 0957 10/01/11 1211   10/01/11 1100   fluconazole (DIFLUCAN) IVPB 200 mg  Status:  Discontinued        200 mg 100 mL/hr over 60 Minutes Intravenous Daily 10/01/11 0957 10/02/11 1349   10/01/11 0000   ampicillin (OMNIPEN) 2 g in sodium chloride 0.9 % 50 mL IVPB  Status:  Discontinued        2 g 150 mL/hr over 20 Minutes Intravenous Every 4 hours 09/30/11 1855 10/01/11 1448   09/30/11 1400   acyclovir (ZOVIRAX) 800 mg in dextrose 5 % 150 mL IVPB  Status:  Discontinued        10 mg/kg  80 kg (Order-Specific) 166 mL/hr over 60 Minutes Intravenous 3 times per day  09/30/11 1136 10/01/11 1021   09/30/11 0200   flucytosine (ANCOBON) capsule 1,000 mg  Status:  Discontinued        1,000 mg Oral Every 6 hours 09/30/11 0113 09/30/11 0734   09/30/11 0115   amphotericin B (FUNGIZONE) 80 mg in dextrose 5 % 500 mL IVPB  Status:  Discontinued        1 mg/kg  80.2 kg 125 mL/hr over 4 Hours Intravenous Every 24 hours 09/30/11 0113 10/01/11 1002   09/30/11 0000   vancomycin (VANCOCIN) IVPB 1000 mg/200 mL premix  Status:  Discontinued        1,000 mg 200 mL/hr over 60 Minutes Intravenous Every 8 hours 09/29/11 1755 10/01/11 0851   09/29/11 2000   cefTRIAXone (ROCEPHIN) 2 g in dextrose 5 % 50 mL IVPB  Status:  Discontinued        2 g 100 mL/hr over 30 Minutes Intravenous Every 12 hours 09/29/11 1755 10/01/11 1021   09/29/11 1800   Ampicillin-Sulbactam (UNASYN) 3 g in sodium chloride 0.9 % 100 mL IVPB  Status:  Discontinued        3 g 100 mL/hr over 60 Minutes Intravenous Every 6 hours 09/29/11 1755 09/30/11 1855   09/29/11  1730   dextrose 5 % 600 mL with azithromycin (ZITHROMAX) 1,200 mg infusion  Status:  Discontinued        450 mL/hr  Intravenous Every 7 days 09/29/11 1720 10/01/11 1002   09/29/11 1715   acyclovir (ZOVIRAX) 905 mg in dextrose 5 % 150 mL IVPB  Status:  Discontinued        10 mg/kg  90.7 kg 168.1 mL/hr over 60 Minutes Intravenous 3 times per day 09/29/11 1709 09/30/11 1136   09/29/11 1215  piperacillin-tazobactam (ZOSYN) IVPB 3.375 g       3.375 g 12.5 mL/hr over 240 Minutes Intravenous  Once 09/29/11 1206 09/29/11 1827   09/29/11 1215   vancomycin (VANCOCIN) IVPB 1000 mg/200 mL premix        1,000 mg 200 mL/hr over 60 Minutes Intravenous  Once 09/29/11 1206 09/29/11 1606          Medications:  Scheduled:    . antiseptic oral rinse  15 mL Mouth Rinse QID  . chlorhexidine  15 mL Mouth/Throat BID  . fentaNYL  12.5 mcg Intravenous Once  . fentaNYL  12.5 mcg Intravenous Once  . insulin aspart  0-15 Units Subcutaneous Q4H  .  lactulose  300 mL Rectal BID  . levothyroxine  56 mcg Intravenous Daily  . pantoprazole (PROTONIX) IV  40 mg Intravenous Q12H  . potassium chloride  10 mEq Intravenous Q1 Hr x 3  . sodium chloride  3 mL Intravenous Q12H  . DISCONTD: atovaquone  1,500 mg Per Tube Q breakfast  . DISCONTD: heparin subcutaneous  5,000 Units Subcutaneous Q8H    Objective: Vital signs in last 24 hours: Temp:  [97.5 F (36.4 C)-98.7 F (37.1 C)] 98.7 F (37.1 C) (03/11 0627) Pulse Rate:  [77-95] 95  (03/11 0627) Resp:  [14-18] 18  (03/11 0627) BP: (111-130)/(60-74) 117/69 mmHg (03/11 0627) SpO2:  [93 %-98 %] 95 % (03/11 0627)   Gen - up in chair, slowed speech, difficulty keeping eyes open.  Heent - anicteric, no oral lesions CV - rrr without m/r/g Lungs - CTA B Abd soft, ntnd, +bs Ext - no edema  Lab Results  Basename 10/04/11 0500 10/03/11 0458 10/02/11 0500  WBC -- 6.2 12.2*  HGB -- 12.5* 14.5  HCT -- 35.1* 40.8  NA 152* 145 --  K 3.2* 3.8 --  CL 120* 113* --  CO2 23 21 --  BUN 62* 76* --  CREATININE 1.67* 2.40* --  GLU -- -- --   Liver Panel  Basename 10/04/11 0500 10/03/11 0458  PROT 4.9* 5.0*  ALBUMIN 2.7* 2.7*  AST 70* 334*  ALT 1799* 3500*  ALKPHOS 112 99  BILITOT 1.6* 1.2  BILIDIR -- --  IBILI -- --   Sedimentation Rate No results found for this basename: ESRSEDRATE in the last 72 hours C-Reactive Protein No results found for this basename: CRP:2 in the last 72 hours  Microbiology: Recent Results (from the past 240 hour(s))  CULTURE, BLOOD (ROUTINE X 2)     Status: Normal   Collection Time   09/29/11 10:59 AM      Component Value Range Status Comment   Specimen Description BLOOD RIGHT ANTECUBITAL   Final    Special Requests BOTTLES DRAWN AEROBIC ONLY 6CC   Final    Culture NO GROWTH 5 DAYS   Final    Report Status 10/04/2011 FINAL   Final   CULTURE, BLOOD (ROUTINE X 2)     Status: Normal   Collection Time  09/29/11 11:07 AM      Component Value Range Status  Comment   Specimen Description BLOOD RIGHT ANTECUBITAL   Final    Special Requests BOTTLES DRAWN AEROBIC AND ANAEROBIC [facility]   Final    Culture NO GROWTH 5 DAYS   Final    Report Status 10/04/2011 FINAL   Final   MRSA PCR SCREENING     Status: Normal   Collection Time   09/29/11  5:45 PM      Component Value Range Status Comment   MRSA by PCR NEGATIVE  NEGATIVE  Final     Studies/Results: US Renal Port  10/03/2011  *RADIOLOGY REPORT*  Clinical Data: Elevated BUN and creatinine.  RENAL/URINARY TRACT ULTRASOUND COMPLETE  Comparison:  None.  Findings:  Right Kidney:  12.8 cm in length. No hydronephrosis.  Diffuse increased echogenicity suggesting medical renal disease.  No focal lesions.  Left Kidney:  14.3 cm in length.  Moderate hydronephrosis. Increased echogenicity suggesting medical renal disease. No renal calculi or focal lesions.  Bladder:  A Foley catheter is in place.  Additional findings:  Distended gallbladder is noted with gallstones and sludge. The spleen is enlarged with splenic volume of 766 ml. Small amount of free abdominal fluid.  IMPRESSION:  1.  Increased echogenicity of both kidneys consistent with medical renal disease. 2.  Left-sided hydronephrosis. 3.  Cholelithiasis. 4.  Splenomegaly. 5.  Small amount of free fluid.  Original Report Authenticated By: P. Loralie Champagne, M.D.     Assessment/Plan: HIV - CD 4 is 30, viral load pending.  Holding ARV due to hepatitis.  Patient has consented to obtaining records from [facility].  He will need MAC prophylaxis once hepatitis resolves.   Hepatitis - unclear etiology.  Medications, ingestions, autoimmune.  Patient denies any ingestion.    Encephalopathy - unclear etiology, though is slowly recovering.  As above, ingestion a possibility.      Staci Righter Infectious Diseases [phone] 10/04/2011, 12:03 PM

## 2011-10-04 NOTE — Progress Notes (Addendum)
Nutrition Follow-up  Pt very depressed when talking to him. While talking to pt, pt began to cry. Pt has remained NPO since admission. When asked pt if he had an appetite, pt stated there was no use in eating anymore. Asked pt if he had any family visit; pt stated no. Per RN, pt had a visitor earlier today and pt's mother is coming tomorrow.   HIV+ with lymphoma and dementia, poor prognosis per social work note.  Pt extubated on 10/01/11. Bedside swallow evaluation completed 3/10 and 3/11. Per SLP note, she recommends pt to remain NPO. MBS to be completed 3/12.   Pt diagnosed with moderate malnutrition in the context of chronic illness given ~10% weight loss in 6 months with temporal wasting by RD on 09/30/11.   Diet Order:  NPO x 5 days  Meds: Scheduled Meds:   . antiseptic oral rinse  15 mL Mouth Rinse QID  . chlorhexidine  15 mL Mouth/Throat BID  . fentaNYL  12.5 mcg Intravenous Once  . fentaNYL  12.5 mcg Intravenous Once  . insulin aspart  0-15 Units Subcutaneous Q4H  . lactulose  300 mL Rectal BID  . levothyroxine  56 mcg Intravenous Daily  . pantoprazole (PROTONIX) IV  40 mg Intravenous Q12H  . potassium chloride  10 mEq Intravenous Q1 Hr x 3  . sodium chloride  3 mL Intravenous Q12H  . DISCONTD: atovaquone  1,500 mg Per Tube Q breakfast  . DISCONTD: heparin subcutaneous  5,000 Units Subcutaneous Q8H   Continuous Infusions:   . sodium chloride 75 mL/hr at 10/03/11 1021   PRN Meds:.ondansetron (ZOFRAN) IV, ondansetron  Labs:  CMP     Component Value Date/Time   NA 152* 10/04/2011 0500   K 3.2* 10/04/2011 0500   CL 120* 10/04/2011 0500   CO2 23 10/04/2011 0500   GLUCOSE 119* 10/04/2011 0500   BUN 62* 10/04/2011 0500   CREATININE 1.67* 10/04/2011 0500   CALCIUM 8.2* 10/04/2011 0500   PROT 4.9* 10/04/2011 0500   ALBUMIN 2.7* 10/04/2011 0500   AST 70* 10/04/2011 0500   ALT 1799* 10/04/2011 0500   ALKPHOS 112 10/04/2011 0500   BILITOT 1.6* 10/04/2011 0500   GFRNONAA 43* 10/04/2011 0500    GFRAA 50* 10/04/2011 0500  Mg: 2.2 on 10/01/11 (WNL) Phosphorus: 5.2 on 10/01/11. (high)   Intake/Output Summary (Last 24 hours) at 10/04/11 1530 Last data filed at 10/04/11 1100  Gross per 24 hour  Intake   1100 ml  Output   4400 ml  Net  -3300 ml    Weight Status:  82.9 kg on 3/10 (stable) Usual Wt: per pt, 160 lbs (question accuracy) Usual Wt: per records: 89.359 kg  Re-estimated needs:   Kcal: 2050-2250 Protein: 124-150 grams Fluid: >2.1 L  Nutrition Dx:  1- Inadequate oral intake, ongoing. 2-Malnutrition; ongoing.    Goal:   Intake to meet 90-100% of estimated nutrition needs, not met.  Intervention:    If pt remains NPO, recommend to place enteral feeding tube and start Osmolite 1.2 at 20 ml /hr, increase by 10 ml every 12 hours, to goal rate of 60 ml/hr with 30 ml Prostat QID. This provides 2016 kcal (98% of estimated needs), 140 grams protein (100% estimated needs), and 1181 ml free water.   Pt is at risk for refeeding syndrome, recommend monitoring magnessium, phosphorus, and potassium once TF is initiated and for 3 days following initiation.  If diet advanced, will supplement diet as appropriate.  Monitor:  Initiation of  TF or diet advancement, weight trend, electrolytes, labs, I/O's  Karenann Cai Pager #:  [phone]  Kendell Bane [place]el[place] [phone]

## 2011-10-04 NOTE — Progress Notes (Signed)
Speech Language/Pathology  Speech Pathology: Dysphagia Treatment Note   Subjective:  Pt. up in chair and is more alert than than past several days per RN and friend at bedside.  Objective:  Pt. seen today for possibility of initiating po's.  Oral care provided before observing pt. With ice chips, water, and applesauce.  Pt. Coughed prior to po's as well as consistently after thin liquids and nectar thick (although less) indicative of probable penetration/aspiration possibly due to poor oral control and cohesion.  Delayed multiple swallows observed due to possible pharyngeal residue.  Given clinical observations and pt.'s decreased cognition and lethargy, recommend pt. Continue NPO status with MBS tomorrow.  Pt. Required mod verbal and tactile cues to hold cup and take small sips.  Lung Sounds:  diminished Temperature: 98.7   Clinical Impression:  Suspect decreased oral control and possible pharyngeal deficits resulting in possible penetration/aspiration.   Recommendations: NPO, MBS 3/12   Pain:   none Intervention Required:   No   Goals: No Goals Met  Troy Holden [place] M.Ed [employer] [phone]  10/04/2011

## 2011-10-04 NOTE — Progress Notes (Signed)
Notified Hospitalist of patient's pain. Positioning worked to relieve pain for patient for a little while, but patient stated pain was increasing. Received one time order for pain medicine. Will cont to monitor.

## 2011-10-04 NOTE — Progress Notes (Signed)
Patient ID: Troy Holden, male   DOB: [birth year], 61 y.o.   MRN: [id number] Subjective: No events overnight.   Objective:  Vital signs in last 24 hours:  Filed Vitals:   10/03/11 1300 10/03/11 1519 10/03/11 2132 10/04/11 0627  BP: 111/60 129/74 130/74 117/69  Pulse: 77 80 84 95  Temp:  97.5 F (36.4 C) 97.8 F (36.6 C) 98.7 F (37.1 C)  TempSrc:  Axillary Axillary Axillary  Resp: 14 18 18 18   Height:      Weight:      SpO2: 97% 98% 93% 95%    Intake/Output from previous day:   Gross per 24 hour  Intake   1410 ml  Output   5550 ml  Net  -4140 ml    Physical Exam: General:  no acute distress. HEENT: No bruits, no goiter.dry mucous membranes, no scleral icterus, no conjunctival pallor. Heart: Regular rate and rhythm, S1/S2 +, no murmurs, rubs, gallops. Lungs: somewhat diminished breath sounds  Abdomen: Soft, nontender, nondistended, positive bowel sounds. Extremities: No clubbing or cyanosis, no pitting edema,  positive pedal pulses. Neuro: Grossly nonfocal.  Lab Results:  Lab 10/03/11 0458 10/02/11 0500 10/01/11 0529 09/30/11 1604 09/30/11 0441  WBC 6.2 12.2* 12.8* 10.0 7.1  HGB 12.5* 14.5 14.1 15.2 15.8  HCT 35.1* 40.8 40.5 42.9 45.2  PLT 55* clumpled 120* 172 114*  MCV 85.0 84.3 86.4 85.1 85.6    Lab 10/04/11 0500 10/03/11 0458 10/02/11 0500 10/01/11 0529 09/30/11 1858  NA 152* 145 139 134* 132*  K 3.2* 3.8 4.4 3.4* 3.8  CL 120* 113* 107 104 105  CO2 23 21 18* 18* 16*  GLUCOSE 119* 131* 105* 206* 172*  BUN 62* 76* 62* 26* 17  CREATININE 1.67* 2.40* 2.45* 1.15 0.82    Lab 09/30/11 0124 09/29/11 1756 09/29/11 1055  CKMB 16.7* 14.3* --  TROPONINI <0.30 <0.30 <0.30   CULTURE, BLOOD (ROUTINE X 2)     Status: Normal   Collection Time   09/29/11 10:59 AM      Component Value Range Status Comment   Specimen Description BLOOD RIGHT   Final    Culture NO GROWTH 5 DAYS   Final    Report Status 10/04/2011 FINAL   Final   CULTURE, BLOOD (ROUTINE X 2)     Status:  Normal   Collection Time   09/29/11 11:07 AM      Component Value Range Status Comment   Specimen Description BLOOD RIGHT   Final    Culture NO GROWTH 5 DAYS   Final    Report Status 10/04/2011 FINAL   Final     Studies/Results: US Renal Port 10/03/2011  .  IMPRESSION:  1.  Increased echogenicity of both kidneys consistent with medical renal disease. 2.  Left-sided hydronephrosis. 3.  Cholelithiasis. 4.  Splenomegaly. 5.  Small amount of free fluid.    Medications: Scheduled Meds:   . antiseptic oral rinse  15 mL Mouth Rinse QID  . chlorhexidine  15 mL Mouth/Throat BID  . fentaNYL  12.5 mcg Intravenous Once  . insulin aspart  0-15 Units Subcutaneous Q4H  . lactulose  300 mL Rectal BID  . levothyroxine  56 mcg Intravenous Daily  . pantoprazole (PROTONIX) IV  40 mg Intravenous Q12H  . potassium chloride  10 mEq Intravenous Q1 Hr x 3  . sodium chloride  3 mL Intravenous Q12H  . DISCONTD: atovaquone  1,500 mg Per Tube Q breakfast   ASSESSMENT AND PLAN  NEUROLOGIC  Metabolic encephalopathy.  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 No evidence of acute stroke on brain MRI. No evidence of mass lesions. LP failed by IR. Toxicology positive for opiates.  - Resolving rapidly  - mproved with lactulose, se lFt trend  - Avoid sedation  - Treat hepatic enceph  - follow up SLP and PT recommendation   Acute respiratory failure.  - as per recent information by SW patient's family wants to to discuss the goals of care - I will meet with them tomorrow at 9 am  RENAL  - perhaps related to dehydration, HIV, meds -  stable creatinine at 2.40 - continue to monitor  Hyponatremia. Metabolic acidosis - mild. Hypokalemia  - Acute renal failure slow resolution  - renal ultrasound within normal limits  Mildly elevated liver enzymes. Now trending down, unclear  - Acute hep panel-neg thus far   INFECTIOUS  - No clear infectious source. HIV+.  - ID is following  Education - will meet  with family in am  to discuss goals of care   LOS: 5 days   Ryan Palermo 10/04/2011, 5:38 PM  [facility] Pager: [phone]

## 2011-10-04 NOTE — Evaluation (Signed)
Physical Therapy Evaluation Patient Details Name: Troy Holden MRN: [id number] DOB: 1950-12-04 Today's Date: 10/04/2011  Problem List:  Patient Active Problem List  Diagnoses  . DLBCL (diffuse large B cell lymphoma)  . EBV positive mononucleosis syndrome  . HIV (human immunodeficiency virus infection)  . Dementia  . CAD (coronary artery disease)  . Hypothyroidism  . Dyslipidemia  . Toxic metabolic encephalopathy  . Elevated LFTs  . Hyponatremia  . Metabolic acidosis    Past Medical History:  Past Medical History  Diagnosis Date  . Heart disease   . Cancer     lymphoma  . HIV (human immunodeficiency virus infection)   . Anemia   . Anxiety   . Blood transfusion   . Cataract   . Depression   . GERD (gastroesophageal reflux disease)   . Thyroid disease     low thyroid  . 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 HIV (human immunodeficiency virus infection) 04/20/2011  . Dementia 04/20/2011  . CAD (coronary artery disease) 04/20/2011  . Hypothyroidism 04/20/2011  . Dyslipidemia 04/20/2011   Past Surgical History:  Past Surgical History  Procedure Date  . Cardiac surgery   . Abdominal surgery     PT Assessment/Plan/Recommendation PT Assessment Clinical Impression Statement: Pt is 61 y/o admitted for generalized weakness and AMS with diagnosis of Toxic metabolic encephalopathy.  Pt will benefit from acute PT services to improve overall mobility and prepare for safe d/c to next venue.  PT Recommendation/Assessment: Patient will need skilled PT in the acute care venue PT Problem List: Decreased strength;Decreased balance;Decreased activity tolerance;Decreased mobility;Decreased cognition;Decreased knowledge of use of DME;Decreased safety awareness;Pain PT Therapy Diagnosis : Difficulty walking;Abnormality of gait;Generalized weakness;Acute pain PT Plan PT Frequency: Min 3X/week PT Treatment/Interventions: DME  instruction;Gait training;Functional mobility training;Therapeutic activities;Therapeutic exercise;Neuromuscular re-education;Balance training;Patient/family education;Cognitive remediation PT Recommendation Follow Up Recommendations: Skilled nursing facility Equipment Recommended: None recommended by PT PT Goals  Acute Rehab PT Goals PT Goal Formulation: With patient Time For Goal Achievement: 2 weeks Pt will go Supine/Side to Sit: with supervision PT Goal: Supine/Side to Sit - Progress: Goal set today Pt will Sit at [facility] of Bed: with supervision;3-5 min PT Goal: Sit at Edge Of Bed - Progress: Goal set today Pt will go Sit to Supine/Side: with supervision PT Goal: Sit to Supine/Side - Progress: Goal set today Pt will go Sit to Stand: with min assist;from elevated surface PT Goal: Sit to Stand - Progress: Goal set today Pt will go Stand to Sit: with min assist;to elevated surface;with upper extremity assist PT Goal: Stand to Sit - Progress: Goal set today Pt will Transfer Bed to Chair/Chair to Bed: with min assist PT Transfer Goal: Bed to Chair/Chair to Bed - Progress: Goal set today Pt will Stand: with supervision;1 - 2 min PT Goal: Stand - Progress: Goal set today Pt will Ambulate: 16 - 50 feet;with min assist;with rolling walker PT Goal: Ambulate - Progress: Goal set today  PT Evaluation Precautions/Restrictions  Precautions Precautions: Fall Restrictions Weight Bearing Restrictions: No Prior Functioning  Home Living Lives With:  (at ALF) Receives Help From: Other (Comment) (ALF) Additional Comments: Pt living at ALF per ALF pt was ambulatory and communicating.  Pt unresponsive on admission and now communicating to simple questions. Prior Function Level of Independence: Independent with transfers;Independent with gait;Independent with basic ADLs Able to Take Stairs?: No Cognition Cognition Arousal/Alertness: Awake/alert Overall Cognitive Status: Impaired Attention:  Impaired Current Attention Level: Sustained  Attention - Other Comments: Difficulty with attention due to pain all over. Memory: Appears impaired Memory Deficits: short term memory Orientation Level: Oriented to person Following Commands: Follows multi-step commands inconsistently Safety/Judgement: Decreased awareness of safety precautions Decreased Safety/Judgement: Impulsive Safety/Judgement - Other Comments: slightly impulsive attempting to stand Sensation/Coordination   Extremity Assessment RLE Assessment RLE Assessment: Exceptions to [facility] RLE Strength RLE Overall Strength: Deficits RLE Overall Strength Comments: 3+/5 gross  LLE Assessment LLE Assessment: Exceptions to [employer] LLE Strength LLE Overall Strength: Deficits LLE Overall Strength Comments: 3+/5 gross with functional mobility Mobility (including Balance) Bed Mobility Bed Mobility: Yes Supine to Sit: 1: +2 Total assist;Patient percentage (comment);With rails (pt 40%) Supine to Sit Details (indicate cue type and reason): Pt needed max cues and motivation to initiate mobility.  Pt continue to state "I can't move."  Pt needed (A) to initiate mobility with LE and elevate trunk OOB.   Transfers Transfers: Yes Sit to Stand: 1: +2 Total assist;Patient percentage (comment);From bed;With armrests (pt 40%) Sit to Stand Details (indicate cue type and reason): (A) to initiate transfer with cues for hand placement.  Tactile cues at hips to extend trunk and promote upright standing. Stand to Sit: 1: +2 Total assist;Patient percentage (comment);To chair/3-in-1;With armrests (pt 40%) Stand to Sit Details: (A) to slowly descend to recliner with cues for hand placement. Stand Pivot Transfers: 1: +2 Total assist;Patient percentage (comment) (pt 50%) Stand Pivot Transfer Details (indicate cue type and reason): (A) to maintain balance and promote step sequence to recliner with manual cues to advance hips and trunk to  recliner. Ambulation/Gait Ambulation/Gait: No  Posture/Postural Control Posture/Postural Control: No significant limitations Balance Balance Assessed: Yes Static Sitting Balance Static Sitting - Balance Support: Feet supported Static Sitting - Level of Assistance: 4: Min assist Static Sitting - Comment/# of Minutes: Initial min (A) to maintain balance due to posterior lean.  Pt sat EOB ~5 minutes prior to transfer and increased to supervision. Exercise    End of Session PT - End of Session Equipment Utilized During Treatment: Gait belt Activity Tolerance: Patient limited by fatigue;Patient limited by pain Patient left: in chair;with call bell in reach Nurse Communication: Mobility status for transfers General Behavior During Session: Agitated Cognition: Impaired  Elmor Kost 10/04/2011, 12:58 PM Pager:  [phone]

## 2011-10-04 NOTE — Progress Notes (Signed)
CSW spoke with pt brother by phone to discuss the events of the past few days. Pt brother states he and his family have made some "big decisions" and requests to speak with MD.  With regard to disposition, pt brother agreeable to SNF, requests placement in [place] if possible.  Dr Elisabeth Pigeon to meet with pt brother at 0900 on 3/12.  CSW will follow to provide continued support and address disposition needs, as appropriate to pt medical progression and MD recommendations.  Baxter Flattery, MSW [phone]

## 2011-10-04 NOTE — Progress Notes (Signed)
Nursing- flexiseal noted to not be in place during assessment. Pt ordered a lactulose enema at 2000. Pt unable to tolerate reinsertion of flexiseal. Pt stated he needed a break before trying again. Around 2130 pt was able to ambulate with 2 assist to the [facility]. Pt refused flexiseal insertion/lactulose administration when I came back to attempt to administer. MD paged and notified. She stated to try to administer the enema via rectum. Pt is currently refusing to take enema. Will re-attempt education later.

## 2011-10-04 NOTE — Progress Notes (Signed)
  Speech Language/Pathology  Attempted to see for po readiness/tolerance, however, pt. Currently working with RN (pt. Uncomfortable, receiving large enema)  PL:  Will return later today.  Breck Coons [place].Ed [employer] [phone]  10/04/2011

## 2011-10-05 ENCOUNTER — Inpatient Hospital Stay (HOSPITAL_COMMUNITY): Payer: Medicaid Other

## 2011-10-05 LAB — BASIC METABOLIC PANEL
BUN: 31 mg/dL — ABNORMAL HIGH (ref 6–23)
CO2: 24 mEq/L (ref 19–32)
Calcium: 8.5 mg/dL (ref 8.4–10.5)
Chloride: 116 mEq/L — ABNORMAL HIGH (ref 96–112)
Creatinine, Ser: 0.94 mg/dL (ref 0.50–1.35)
Glucose, Bld: 183 mg/dL — ABNORMAL HIGH (ref 70–99)

## 2011-10-05 LAB — HIV-1 RNA ULTRAQUANT REFLEX TO GENTYP+: HIV-1 RNA Quant, Log: <1.3 {Log} (ref <1.30)

## 2011-10-05 LAB — CBC
HCT: 33.3 % — ABNORMAL LOW (ref 39.0–52.0)
MCH: 30 pg (ref 26.0–34.0)
MCV: 86.9 fL (ref 78.0–100.0)
RBC: 3.83 MIL/uL — ABNORMAL LOW (ref 4.22–5.81)
WBC: 4.5 10*3/uL (ref 4.0–10.5)

## 2011-10-05 LAB — GLUCOSE, CAPILLARY
Glucose-Capillary: 123 mg/dL — ABNORMAL HIGH (ref 70–99)
Glucose-Capillary: 99 mg/dL (ref 70–99)
Glucose-Capillary: 127 mg/dL — ABNORMAL HIGH (ref 70–99)
Glucose-Capillary: 186 mg/dL — ABNORMAL HIGH (ref 70–99)
Glucose-Capillary: 194 mg/dL — ABNORMAL HIGH (ref 70–99)

## 2011-10-05 MED ORDER — LACTULOSE 10 GM/15ML PO SOLN
30.0000 g | Freq: Three times a day (TID) | ORAL | Status: DC
  Administered 2011-10-05: 30 g via ORAL
  Filled 2011-10-05 (×4): qty 45

## 2011-10-05 MED ORDER — LACTULOSE 10 GM/15ML PO SOLN
30.0000 g | Freq: Four times a day (QID) | ORAL | Status: DC
  Filled 2011-10-05 (×2): qty 45

## 2011-10-05 MED ORDER — STARCH (THICKENING) PO POWD
ORAL | Status: DC | PRN
  Filled 2011-10-05 (×2): qty 227

## 2011-10-05 MED ORDER — PANTOPRAZOLE SODIUM 40 MG PO TBEC
40.0000 mg | DELAYED_RELEASE_TABLET | Freq: Every day | ORAL | Status: DC
  Administered 2011-10-05 – 2011-10-08 (×4): 40 mg via ORAL
  Filled 2011-10-05 (×4): qty 1

## 2011-10-05 MED ORDER — SODIUM CHLORIDE 0.9 % IV SOLN
INTRAVENOUS | Status: DC
  Administered 2011-10-05 – 2011-10-06 (×2): via INTRAVENOUS

## 2011-10-05 MED ORDER — POTASSIUM CHLORIDE 10 MEQ/100ML IV SOLN
10.0000 meq | INTRAVENOUS | Status: AC
  Administered 2011-10-05 (×4): 10 meq via INTRAVENOUS
  Filled 2011-10-05 (×4): qty 100

## 2011-10-05 MED ORDER — LEVOTHYROXINE SODIUM 112 MCG PO TABS
112.0000 ug | ORAL_TABLET | Freq: Every day | ORAL | Status: DC
  Administered 2011-10-05 – 2011-10-08 (×4): 112 ug via ORAL
  Filled 2011-10-05 (×5): qty 1

## 2011-10-05 MED ORDER — POTASSIUM CHLORIDE CRYS ER 20 MEQ PO TBCR
40.0000 meq | EXTENDED_RELEASE_TABLET | Freq: Once | ORAL | Status: AC
  Administered 2011-10-05: 40 meq via ORAL
  Filled 2011-10-05: qty 2

## 2011-10-05 NOTE — Progress Notes (Addendum)
Nursing- approximately 0030, heard pt IV beeping. Went in to assess and the patient had blood all over his gown and sheets. Pt had removed his IJ CVC. Catheter intact. Gauze applied. IV team called and dressing was applied over site. MD notified. Pt refused new peripheral IV site at this time. Also still refusing lactulose. MD stated she would pass along to AM MD to discuss with family tomorrow at family meeting.

## 2011-10-05 NOTE — Consult Note (Signed)
Reason for Consult: depression and capacity evaluation   Troy Holden is an 61 y.o. male.  HPI:  Troy Holden is an 61 y.o. male with a PMH of HIV who resides at [facility]. Per chart he is normally ambulatory and conversant. EMS was called by ALF staff due to unresponsiveness. The patient arrived in the ER soiled, with emesis on his shirt. Marland Kitchen An MRI of the brain was unrevealing.  The patient is unable to provide any history when he was brought to ED.   Was seen today. Very disorganized and unable to give much relevant history. Not sure why he is in the hospital. appeared to be delusional and accused some one of hurting him at the time admission but unable to give details. Not able to give the details of any depressive symptoms. Verbally abusive language at times.   Past Medical History  Diagnosis Date  . Heart disease   . Cancer     lymphoma  . HIV (human immunodeficiency virus infection)   . Anemia   . Anxiety   . Blood transfusion   . Cataract   . Depression   . GERD (gastroesophageal reflux disease)   . Thyroid disease     low thyroid  . 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 HIV (human immunodeficiency virus infection) 04/20/2011  . Dementia 04/20/2011  . CAD (coronary artery disease) 04/20/2011  . Hypothyroidism 04/20/2011  . Dyslipidemia 04/20/2011    Past Surgical History  Procedure Date  . Cardiac surgery   . Abdominal surgery     Family History  Problem Relation Age of Onset  . Colon cancer Mother   . Colon cancer Brother     Social History:  reports that he has been smoking Cigarettes.  He has a 10 pack-year smoking history. He does not have any smokeless tobacco history on file. He reports that he drinks alcohol. He reports that he does not use illicit drugs.  Allergies:  Allergies  Allergen Reactions  . Xylocaine Rash    Patient had to be rushed to hospital from dentist office after  receiving xylocaine  . Trazodone And Nefazodone Other (See Comments)    Nightmares, hangover like feeling for 2-3 days    Medications: I have reviewed the patient's current medications.  Results for orders placed during the hospital encounter of 09/29/11 (from the past 48 hour(s))  GLUCOSE, CAPILLARY     Status: Abnormal   Collection Time   10/04/11  3:55 AM      Component Value Range Comment   Glucose-Capillary 118 (*) 70 - 99 (mg/dL)   COMPREHENSIVE METABOLIC PANEL     Status: Abnormal   Collection Time   10/04/11  5:00 AM      Component Value Range Comment   Sodium 152 (*) 135 - 145 (mEq/L)    Potassium 3.2 (*) 3.5 - 5.1 (mEq/L)    Chloride 120 (*) 96 - 112 (mEq/L)    CO2 23  19 - 32 (mEq/L)    Glucose, Bld 119 (*) 70 - 99 (mg/dL)    BUN 62 (*) 6 - 23 (mg/dL)    Creatinine, Ser 0.45 (*) 0.50 - 1.35 (mg/dL)    Calcium 8.2 (*) 8.4 - 10.5 (mg/dL)    Total Protein 4.9 (*) 6.0 - 8.3 (g/dL)    Albumin 2.7 (*) 3.5 - 5.2 (g/dL)    AST 70 (*) 0 - 37 (U/L)  ALT 1799 (*) 0 - 53 (U/L)    Alkaline Phosphatase 112  39 - 117 (U/L)    Total Bilirubin 1.6 (*) 0.3 - 1.2 (mg/dL)    GFR calc non Af Amer 43 (*) >90 (mL/min)    GFR calc Af Amer 50 (*) >90 (mL/min)   GLUCOSE, CAPILLARY     Status: Normal   Collection Time   10/04/11 10:12 AM      Component Value Range Comment   Glucose-Capillary 90  70 - 99 (mg/dL)   GLUCOSE, CAPILLARY     Status: Abnormal   Collection Time   10/04/11 11:49 AM      Component Value Range Comment   Glucose-Capillary 105 (*) 70 - 99 (mg/dL)    Comment 1 Notify RN     GLUCOSE, CAPILLARY     Status: Normal   Collection Time   10/04/11  4:44 PM      Component Value Range Comment   Glucose-Capillary 98  70 - 99 (mg/dL)   GLUCOSE, CAPILLARY     Status: Normal   Collection Time   10/04/11  8:44 PM      Component Value Range Comment   Glucose-Capillary 99  70 - 99 (mg/dL)    Comment 1 Documented in Chart      Comment 2 Notify RN     GLUCOSE, CAPILLARY      Status: Normal   Collection Time   10/05/11 12:24 AM      Component Value Range Comment   Glucose-Capillary 99  70 - 99 (mg/dL)    Comment 1 Documented in Chart      Comment 2 Notify RN     GLUCOSE, CAPILLARY     Status: Abnormal   Collection Time   10/05/11  3:45 AM      Component Value Range Comment   Glucose-Capillary 123 (*) 70 - 99 (mg/dL)    Comment 1 Documented in Chart      Comment 2 Notify RN     GLUCOSE, CAPILLARY     Status: Abnormal   Collection Time   10/05/11  8:38 AM      Component Value Range Comment   Glucose-Capillary 124 (*) 70 - 99 (mg/dL)   GLUCOSE, CAPILLARY     Status: Abnormal   Collection Time   10/05/11 12:44 PM      Component Value Range Comment   Glucose-Capillary 127 (*) 70 - 99 (mg/dL)   CBC     Status: Abnormal   Collection Time   10/05/11  2:00 PM      Component Value Range Comment   WBC 4.5  4.0 - 10.5 (K/uL)    RBC 3.83 (*) 4.22 - 5.81 (MIL/uL)    Hemoglobin 11.5 (*) 13.0 - 17.0 (g/dL)    HCT 16.1 (*) 09.6 - 52.0 (%)    MCV 86.9  78.0 - 100.0 (fL)    MCH 30.0  26.0 - 34.0 (pg)    MCHC 34.5  30.0 - 36.0 (g/dL)    RDW 04.5 (*) 40.9 - 15.5 (%)    Platelets 42 (*) 150 - 400 (K/uL) CONSISTENT WITH PREVIOUS RESULT  BASIC METABOLIC PANEL     Status: Abnormal   Collection Time   10/05/11  2:00 PM      Component Value Range Comment   Sodium 152 (*) 135 - 145 (mEq/L)    Potassium 2.6 (*) 3.5 - 5.1 (mEq/L)    Chloride 116 (*) 96 - 112 (mEq/L)  CO2 24  19 - 32 (mEq/L)    Glucose, Bld 183 (*) 70 - 99 (mg/dL)    BUN 31 (*) 6 - 23 (mg/dL) DELTA CHECK NOTED   Creatinine, Ser 0.94  0.50 - 1.35 (mg/dL) DELTA CHECK NOTED   Calcium 8.5  8.4 - 10.5 (mg/dL)    GFR calc non Af Amer 90 (*) >90 (mL/min)    GFR calc Af Amer >90  >90 (mL/min)   GLUCOSE, CAPILLARY     Status: Abnormal   Collection Time   10/05/11  4:39 PM      Component Value Range Comment   Glucose-Capillary 186 (*) 70 - 99 (mg/dL)   SODIUM, URINE, RANDOM     Status: Normal   Collection Time    10/05/11  6:09 PM      Component Value Range Comment   Sodium, Ur 94     GLUCOSE, CAPILLARY     Status: Abnormal   Collection Time   10/05/11  8:22 PM      Component Value Range Comment   Glucose-Capillary 194 (*) 70 - 99 (mg/dL)     Ct Abdomen Pelvis Wo Contrast  10/05/2011  *RADIOLOGY REPORT*  Clinical Data: Evaluate left hydronephrosis on ultrasound  CT ABDOMEN AND PELVIS WITHOUT CONTRAST  Technique:  Multidetector CT imaging of the abdomen and pelvis was performed following the standard protocol without intravenous contrast.  Comparison: Ultrasound dated 10/05/2011  Findings: Patchy ground-glass opacities in the lung bases, suspicious for multifocal pneumonia or less likely interstitial edema.  Trace left pleural effusion.  Hepatic steatosis.  Splenomegaly, measuring 16.0 cm in craniocaudal dimension.  Pancreas and adrenal glands are grossly unremarkable.  Cholelithiasis, without associated inflammatory changes.  No intrahepatic or extrahepatic ductal dilatation.  Mild left hydronephrosis with associated perinephric stranding. Right kidney is unremarkable.  No renal calculi are seen.  No evidence of bowel obstruction.  Atherosclerotic calcifications of the abdominal aorta and branch vessels.  No abdominopelvic ascites.  No suspicious abdominopelvic lymphadenopathy.  Prostate is unremarkable.  No ureteral or bladder calculi are seen.  Bladder is decompressed with indwelling Foley catheter and associated nondependent gas.  Mild degenerative changes of the visualized thoracolumbar spine, most prominent at L5-S1.  IMPRESSION: Mild left hydronephrosis with associated perinephric stranding. The etiology of this finding is not clear, but could reflect infection, prior ureteral calculus, or less likely recent vascular insult.  No renal, ureteral, or bladder calculi are seen.  Bladder is decompressed by indwelling Foley catheter.  Patchy ground-glass opacities in the lung bases, suspicious for multifocal  pneumonia or less likely interstitial edema.  Trace left pleural effusion.  Additional stable ancillary findings as above.  Original Report Authenticated By: Charline Bills, M.D.   US Abdomen Complete  10/05/2011  *RADIOLOGY REPORT*  Clinical Data:  Abnormal liver function tests.  ABDOMINAL ULTRASOUND COMPLETE  Comparison:  Renal ultrasound 10/03/2011  Findings:  Gallbladder:  A 1.8 cm gallstone is seen near the fundus. Gallbladder wall thickness is an approximately 3.3 to 3.6 mm.  The sonographic Murphy's sign is negative.  Common Bile Duct:  Within normal limits in caliber. Measures 3.4 mm.  Liver: No focal mass lesion identified.  Within normal limits in parenchymal echogenicity. No evidence of ductal dilatation.  IVC:  Appears normal.  Pancreas:  Although the pancreas is difficult to visualize in its entirety, no focal pancreatic abnormality is identified.  Spleen:  The spleen is enlarged; and measures 13.2 cm in length and has a approximate calculated volume of 963.6  cm cubed.  Right kidney:  Measures 0.0 cm in length.  Echogenicity of the renal cortex is slightly increased.  There is no evidence of cortical thinning or scarring.  No evidence of massor hydronephrosis.  Left kidney:  Measures 14.0 cm in length.  Echogenicity of the renal cortex is slightly increased.There is mild left hydronephrosis.  Abdominal Aorta:  No aneurysm identified.Maximal AP diameter is 2.5 cm.  IMPRESSION: 1.  The liver has normal sonographic appearances.  No definite etiology for elevated LFTs is identified. 2.  Mild left hydronephrosis. 3.  Slight increased echogenicity of both kidneys suggest medical renal disease. 4.  Cholelithiasis.  No definite sonographic evidence of cholecystitis. 5.  Splenomegaly.  Original Report Authenticated By: Britta Mccreedy, M.D.   Dg Swallowing Func-no Report  10/05/2011  CLINICAL DATA: dysphagia   FLUOROSCOPY FOR SWALLOWING FUNCTION STUDY:  Fluoroscopy was provided for swallowing function  study, which was  administered by a speech pathologist.  Final results and recommendations  from this study are contained within the speech pathology report.      ROS Blood pressure 169/76, pulse 81, temperature 98.1 F (36.7 C), temperature source Oral, resp. rate 20, height 6\' 2"  (1.88 m), weight 82.9 kg (182 lb 12.2 oz), SpO2 98.00%. Physical Exam   MSE:  alert and oriented to only person and place. On bed. No signs of tics or EPS. Behavior is non-cooperative.Speech is of rapid rate, volume. Mood is irritable . Affect flat. Thought process is non- coherent. Thought content includes possible delusions, Associations are intact. Immediate, recent and remote memory are poor. Approximate intelligence is below average. Insight and judgment are imapird  Assessment  Axis I: psychotic d/o nos, cognitive d/o nos Axis II: Deferred Axis III: See medical hx Axis [id number] social support Axis V: 25   Rec:  - pt appeared to be psychotic at this time. Unable to asses for depression at this time  - significant cognition deficits may be secondary to dementia or psychosis or both  - in my opinon he does not have the capacity to make medical decisions like refusing meds for his current medical problems at this time  - recommend low does of risperidone 0.5 mg QHS for psychosis. plz consider benefits vs side effects  - will continue to to follow   Wonda Cerise 10/05/2011, 11:12 PM

## 2011-10-05 NOTE — Procedures (Signed)
Modified Barium Swallow Procedure Note Patient Details  Name: Troy Holden MRN: [id number] Date of Birth: 03/12/1951  Today's Date: 10/05/2011 Time:  -     Past Medical History:  Past Medical History  Diagnosis Date  . Heart disease   . Cancer     lymphoma  . HIV (human immunodeficiency virus infection)   . Anemia   . Anxiety   . Blood transfusion   . Cataract   . Depression   . GERD (gastroesophageal reflux disease)   . Thyroid disease     low thyroid  . 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 HIV (human immunodeficiency virus infection) 04/20/2011  . Dementia 04/20/2011  . CAD (coronary artery disease) 04/20/2011  . Hypothyroidism 04/20/2011  . Dyslipidemia 04/20/2011   Past Surgical History:  Past Surgical History  Procedure Date  . Cardiac surgery   . Abdominal surgery    HPI:  61 y/o male who was [facility] on 09-29-11 with AMS. Patient intubated for airway protection and transferred to [facility] for further management. MRI completed on 09-29-11 indicates no acute infarct.  Patient extubated on 10-01-11.  Pt with acute metabolic encephalopathy, dementia, hepatits, HIV, No prior reports of dysphagia in e-chart. Patient's brother contacted by phone on this date by treating SLP.  Brother reports patient has a history of "choking" on solids and was admitted to [facility] "last year" due to food "lodged into his trachea" .  Patient's brother unaware of patient having prior swallow evaluations and stated patient was on regular consistency diet and thin liquids at SNF. pt has shown consistent coughing at bedside and concern for signifcant pharyngeal residuals. MBS warranted to determine safety with POs.       Recommendation/Prognosis  Clinical Impression Dysphagia Diagnosis: Moderate cervical esophageal phase dysphagia;Moderate pharyngeal phase dysphagia Clinical impression: Troy Holden presents with a moderate  pharyngeal and cervical esophageal dysphagia with severe vallecular and pyriform residuals presents post swallow. Pt with base of tongue, pharyngeal contraction and laryngeal elevation all WFL. Residue a result of decreased anterior hyoid excursion and decreased UES opening. Pt with much improved function when asked to hold bolus in mouth, tuck chin and swallow hard with noticable increase in bolus transit through pharynx and UES. No aspiraiton/penetration observed with nectar thick liquids with a chin tuck and multiple swallows. Feel pt likely with difficulty at baseline, increased with recent AMS. Pt may inititate a dys 3 mehcanical soft diet with nectar thick liquids with suggested strategies with mild aspiration risk.  Swallow Evaluation Recommendations Solid Consistency: Dysphagia 3 (Mechanical soft) Liquid Consistency: Nectar Liquid Administration via: Cup;No straw Medication Administration: Whole meds with puree Compensations: Slow rate;Multiple dry swallows after each bite/sip;Effortful swallow Postural Changes and/or Swallow Maneuvers: Chin tuck;Seated upright 90 degrees Oral Care Recommendations: Oral care BID Follow up Recommendations: Skilled Nursing facility Prognosis Prognosis for Safe Diet Advancement: Good Barriers to Reach Goals: Cognitive deficits Individuals Consulted Consulted and Agree with Results and Recommendations: MD  SLP Goals  SLP Swallowing Goals Patient will consume recommended diet without observed clinical signs of aspiration with: Moderate assistance Patient will utilize recommended strategies during swallow to increase swallowing safety with: Minimal cueing  General:  Date of Onset: 09/29/11 HPI: 61 y/o male who was admitted to [facility] on 09-29-11 with AMS. Patient intubated for airway protection and transferred to [facility] for further management. MRI completed on 09-29-11 indicates no acute infarct.  Patient  extubated on 10-01-11.  Pt with acute metabolic  encephalopathy, dementia, hepatits, HIV, No prior reports of dysphagia in e-chart. Patient's brother contacted by phone on this date by treating SLP.  Brother reports patient has a history of "choking" on solids and was admitted to [facility] "last year" due to food "lodged into his trachea" .  Patient's brother unaware of patient having prior swallow evaluations and stated patient was on regular consistency diet and thin liquids at SNF. pt has shown consistent coughing at bedside and concern for signifcant pharyngeal residuals. MBS warranted to determine safety with POs.   Type of Study: Initial MBS Diet Prior to this Study: NPO Temperature Spikes Noted: No Respiratory Status: Room air History of Intubation: Yes Length of Intubations (days): 1 days Date extubated: 10/01/11 Behavior/Cognition: Lethargic;Cooperative;Pleasant mood Oral Cavity - Dentition: Poor condition Oral Motor / Sensory Function: Impaired - see Bedside swallow eval Vision: Functional for self-feeding Patient Positioning: Upright in chair Baseline Vocal Quality: Wet Volitional Cough: Cognitively unable to elicit Volitional Swallow: Unable to elicit Pharyngeal Secretions: Not observed secondary MBS   Oral Phase Oral Preparation/Oral Phase Oral Phase: Impaired Oral - Honey Oral - Honey Teaspoon:  (mild oral residuals) Oral - Honey Cup:  (mild oral residuals) Oral - Nectar Oral - Nectar Teaspoon:  (mild oral residuals) Oral - Nectar Cup:  (mild oral residuals) Oral - Nectar Straw:  (decreased bolus containment) Oral - Thin Oral - Thin Teaspoon: Lingual/palatal residue Oral - Thin Cup: Lingual/palatal residue Oral - Solids Oral - Puree: Within functional limits Oral - Mechanical Soft: Within functional limits Oral Phase - Comment Oral Phase - Comment: Pt with mild oral residuals, suspect due to dry oral mucosa.  Pharyngeal Phase  Pharyngeal Phase Pharyngeal Phase: Impaired Pharyngeal - Honey Pharyngeal - Honey  Teaspoon: Premature spillage to pyriform;Pharyngeal residue - valleculae;Pharyngeal residue - pyriform Pharyngeal - Honey Cup: Premature spillage to pyriform;Pharyngeal residue - valleculae;Pharyngeal residue - pyriform;Reduced epiglottic inversion;Reduced anterior laryngeal mobility (Chin tuck improves bolus transit) Pharyngeal - Nectar Pharyngeal - Nectar Teaspoon: Premature spillage to pyriform;Pharyngeal residue - valleculae;Pharyngeal residue - pyriform;Reduced epiglottic inversion;Reduced anterior laryngeal mobility Pharyngeal - Nectar Cup: Premature spillage to pyriform;Pharyngeal residue - valleculae;Pharyngeal residue - pyriform;Reduced epiglottic inversion;Reduced anterior laryngeal mobility (Chin tuck decreases residue) Pharyngeal - Nectar Straw: Premature spillage to pyriform;Pharyngeal residue - valleculae;Pharyngeal residue - pyriform;Reduced epiglottic inversion;Reduced anterior laryngeal mobility;Penetration/Aspiration before swallow Penetration/Aspiration details (nectar straw): Material enters airway, remains ABOVE vocal cords and not ejected out Pharyngeal - Thin Pharyngeal - Thin Teaspoon: Premature spillage to pyriform;Pharyngeal residue - valleculae;Pharyngeal residue - pyriform;Reduced epiglottic inversion;Reduced anterior laryngeal mobility Penetration/Aspiration details (thin teaspoon): Material does not enter airway Pharyngeal - Thin Cup: Premature spillage to pyriform;Pharyngeal residue - valleculae;Pharyngeal residue - pyriform;Reduced epiglottic inversion;Reduced anterior laryngeal mobility;Penetration/Aspiration during swallow Penetration/Aspiration details (thin cup): Material enters airway, remains ABOVE vocal cords and not ejected out Pharyngeal - Solids Pharyngeal - Puree: Premature spillage to pyriform;Pharyngeal residue - valleculae;Pharyngeal residue - pyriform;Reduced epiglottic inversion;Reduced anterior laryngeal mobility (chin tuck reduces residuals.  ) Pharyngeal - Mechanical Soft: Premature spillage to pyriform;Pharyngeal residue - valleculae;Pharyngeal residue - pyriform;Reduced epiglottic inversion;Reduced anterior laryngeal mobility (chin tuck reduces residuals. ) Cervical Esophageal Phase  Cervical Esophageal Phase Cervical Esophageal Phase: Impaired Cervical Esophageal Phase - Comment Cervical Esophageal Comment: Pt with reduced opening of UES for bolus transit. Suspect this is major factor in moderate to severe pharyngeal residuals. Chin tuck aids in increased opening of UES.   Harlon Ditty, [place] CCC-SLP [phone]  Claudine Mouton 10/05/2011,  2:47 PM

## 2011-10-05 NOTE — Progress Notes (Signed)
Pt has 24 hour creatinine urine collection ordered. Collection began at 1830 and ice bucket is in room for specimen. Pt has foley catheter. Will pass along to night shift.

## 2011-10-05 NOTE — Progress Notes (Signed)
INFECTIOUS DISEASE PROGRESS NOTE  ID: Troy Holden is a 61 y.o. male with  Principal Problem:  *Toxic metabolic encephalopathy Active Problems:  DLBCL (diffuse large B cell lymphoma)  HIV (human immunodeficiency virus infection)  Dementia  CAD (coronary artery disease)  Hypothyroidism  Dyslipidemia  Elevated LFTs  Hyponatremia  Metabolic acidosis  Subjective: Awake, answers questions appropriately.  Still slow to respond.   Abtx:  Anti-infectives     Start     Dose/Rate Route Frequency Ordered Stop   10/03/11 1000   fluconazole (DIFLUCAN) IVPB 100 mg  Status:  Discontinued        100 mg 50 mL/hr over 60 Minutes Intravenous Daily 10/02/11 1349 10/03/11 1023   10/02/11 1200   atovaquone (MEPRON) 750 MG/5ML suspension 1,500 mg  Status:  Discontinued        1,500 mg Per Tube Daily with breakfast 10/01/11 0957 10/04/11 0920   10/01/11 1100   emtricitabine-tenofovir (TRUVADA) 200-300 MG per tablet 1 tablet  Status:  Discontinued        1 tablet Oral Daily 10/01/11 0957 10/01/11 1211   10/01/11 1100   raltegravir (ISENTRESS) tablet 400 mg  Status:  Discontinued        400 mg Oral 2 times daily 10/01/11 0957 10/01/11 1211   10/01/11 1100   fluconazole (DIFLUCAN) IVPB 200 mg  Status:  Discontinued        200 mg 100 mL/hr over 60 Minutes Intravenous Daily 10/01/11 0957 10/02/11 1349   10/01/11 0000   ampicillin (OMNIPEN) 2 g in sodium chloride 0.9 % 50 mL IVPB  Status:  Discontinued        2 g 150 mL/hr over 20 Minutes Intravenous Every 4 hours 09/30/11 1855 10/01/11 1448   09/30/11 1400   acyclovir (ZOVIRAX) 800 mg in dextrose 5 % 150 mL IVPB  Status:  Discontinued        10 mg/kg  80 kg (Order-Specific) 166 mL/hr over 60 Minutes Intravenous 3 times per day 09/30/11 1136 10/01/11 1021   09/30/11 0200   flucytosine (ANCOBON) capsule 1,000 mg  Status:  Discontinued        1,000 mg Oral Every 6 hours 09/30/11 0113 09/30/11 0734   09/30/11 0115   amphotericin B (FUNGIZONE) 80 mg  in dextrose 5 % 500 mL IVPB  Status:  Discontinued        1 mg/kg  80.2 kg 125 mL/hr over 4 Hours Intravenous Every 24 hours 09/30/11 0113 10/01/11 1002   09/30/11 0000   vancomycin (VANCOCIN) IVPB 1000 mg/200 mL premix  Status:  Discontinued        1,000 mg 200 mL/hr over 60 Minutes Intravenous Every 8 hours 09/29/11 1755 10/01/11 0851   09/29/11 2000   cefTRIAXone (ROCEPHIN) 2 g in dextrose 5 % 50 mL IVPB  Status:  Discontinued        2 g 100 mL/hr over 30 Minutes Intravenous Every 12 hours 09/29/11 1755 10/01/11 1021   09/29/11 1800   Ampicillin-Sulbactam (UNASYN) 3 g in sodium chloride 0.9 % 100 mL IVPB  Status:  Discontinued        3 g 100 mL/hr over 60 Minutes Intravenous Every 6 hours 09/29/11 1755 09/30/11 1855   09/29/11 1730   dextrose 5 % 600 mL with azithromycin (ZITHROMAX) 1,200 mg infusion  Status:  Discontinued        450 mL/hr  Intravenous Every 7 days 09/29/11 1720 10/01/11 1002   09/29/11 1715   acyclovir (ZOVIRAX)  905 mg in dextrose 5 % 150 mL IVPB  Status:  Discontinued        10 mg/kg  90.7 kg 168.1 mL/hr over 60 Minutes Intravenous 3 times per day 09/29/11 1709 09/30/11 1136   09/29/11 1215  piperacillin-tazobactam (ZOSYN) IVPB 3.375 g       3.375 g 12.5 mL/hr over 240 Minutes Intravenous  Once 09/29/11 1206 09/29/11 1827   09/29/11 1215   vancomycin (VANCOCIN) IVPB 1000 mg/200 mL premix        1,000 mg 200 mL/hr over 60 Minutes Intravenous  Once 09/29/11 1206 09/29/11 1606          Medications:  Scheduled:    . antiseptic oral rinse  15 mL Mouth Rinse QID  . chlorhexidine  15 mL Mouth/Throat BID  . insulin aspart  0-15 Units Subcutaneous Q4H  . lactulose  300 mL Rectal BID  . levothyroxine  56 mcg Intravenous Daily  . pantoprazole (PROTONIX) IV  40 mg Intravenous Q12H  . potassium chloride  10 mEq Intravenous Q1 Hr x 3  . sodium chloride  3 mL Intravenous Q12H    Objective: Vital signs in last 24 hours: Temp:  [97.6 F (36.4 C)-97.8 F  (36.6 C)] 97.8 F (36.6 C) (03/12 0600) Pulse Rate:  [80-95] 80  (03/12 0600) Resp:  [18-19] 19  (03/12 0600) BP: (126-157)/(68-80) 157/68 mmHg (03/12 0600) SpO2:  [94 %-96 %] 96 % (03/12 0600)   Gen - up in chair, slowed speech, difficulty keeping eyes open.  Heent - anicteric, no oral lesions CV - rrr without m/r/g Lungs - CTA B Abd soft, ntnd, +bs Ext - no edema  Lab Results  Basename 10/04/11 0500 10/03/11 0458  WBC -- 6.2  HGB -- 12.5*  HCT -- 35.1*  NA 152* 145  K 3.2* 3.8  CL 120* 113*  CO2 23 21  BUN 62* 76*  CREATININE 1.67* 2.40*  GLU -- --   Liver Panel  Basename 10/04/11 0500 10/03/11 0458  PROT 4.9* 5.0*  ALBUMIN 2.7* 2.7*  AST 70* 334*  ALT 1799* 3500*  ALKPHOS 112 99  BILITOT 1.6* 1.2  BILIDIR -- --  IBILI -- --   Sedimentation Rate No results found for this basename: ESRSEDRATE in the last 72 hours C-Reactive Protein No results found for this basename: CRP:2 in the last 72 hours  Microbiology: Recent Results (from the past 240 hour(s))  CULTURE, BLOOD (ROUTINE X 2)     Status: Normal   Collection Time   09/29/11 10:59 AM      Component Value Range Status Comment   Specimen Description BLOOD RIGHT ANTECUBITAL   Final    Special Requests BOTTLES DRAWN AEROBIC ONLY 6CC   Final    Culture NO GROWTH 5 DAYS   Final    Report Status 10/04/2011 FINAL   Final   CULTURE, BLOOD (ROUTINE X 2)     Status: Normal   Collection Time   09/29/11 11:07 AM      Component Value Range Status Comment   Specimen Description BLOOD RIGHT ANTECUBITAL   Final    Special Requests BOTTLES DRAWN AEROBIC AND ANAEROBIC [facility]   Final    Culture NO GROWTH 5 DAYS   Final    Report Status 10/04/2011 FINAL   Final   MRSA PCR SCREENING     Status: Normal   Collection Time   09/29/11  5:45 PM      Component Value Range Status Comment  MRSA by PCR NEGATIVE  NEGATIVE  Final     Studies/Results: No results found.   Assessment/Plan: HIV - CD 4 is 30, viral load undetectable,  which suggests he had been taking his meds.  Holding ARV due to hepatitis though.  Patient has consented to obtaining records from [facility].  He will need MAC prophylaxis once hepatitis resolves. Also will need to restart Atovaquone when hepaitits resolves.    Hepatitis - unclear etiology.  Medications, ingestions, autoimmune.  Patient denies any ingestion.  REsolving but refused labs this am.   Encephalopathy - unclear etiology, though is slowly recovering.  As above, ingestion a possibility.    Disposition - patient expressed to me that he will need a new place to live.      Staci Righter Infectious Diseases [phone] 10/05/2011, 1:54 PM

## 2011-10-05 NOTE — Progress Notes (Signed)
Pt refused AM labs this morning.

## 2011-10-05 NOTE — Consult Note (Signed)
Urology Consult   Physician requesting consult: Dr. Darnelle Catalan  Reason for consult: left sided hydronephrosis  History of Present Illness: Troy Holden is a 61 y.o. male with PMH significant for HIV, lymphoma, and dementia who was transferred from [facility] to [facility] with acute mental status changes. His altered mental status was  initially thought to be secondary to suspected meningitis in immunocompromised host versus possible extension from sinusitis. This has been ruled out as no source of infection has been identified.  There was no evidence of acute stroke on brain MRI or evidence of mass lesions. Toxicology was positive for opiates.   He was also noted to have ARI, therefore, a RUS was obtained.  This revealed moderate left sided hydronephrosis with no renal stones and bilateral increased echogenicity consistent with medical renal disease.  A foley has been in place since admission.  His renal function has improved after IV hydration.  Pt is still confused and his story is unreliable.  His responses to questions will change after being repeated to him.  He denies abdominal pain and back pain as well as hx of nephrolithiasis.  A friend of his is in the room and states he complained of "stomach pain" when she arrived this morning.  With repeat questioning about this he states he "hasn't eaten in two days and is hungry."    He denies a history of voiding or storage urinary symptoms, hematuria, UTIs, urolithiasis, GU malignancy/trauma/surgery.  His lymphoma was treated with chemo at [facility] with his last tx in Nov 2012.  He has never had radiation.  Past Medical History  Diagnosis Date  . Heart disease   . Cancer     lymphoma  . HIV (human immunodeficiency virus infection)   . Anemia   . Anxiety   . Blood transfusion   . Cataract   . Depression   . GERD (gastroesophageal reflux disease)   . Thyroid disease     low thyroid  . 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 HIV (human immunodeficiency virus infection) 04/20/2011  . Dementia 04/20/2011  . CAD (coronary artery disease) 04/20/2011  . Hypothyroidism 04/20/2011  . Dyslipidemia 04/20/2011    Past Surgical History  Procedure Date  . Cardiac surgery   . Abdominal surgery     Current Hospital Medications: Scheduled Meds:   . antiseptic oral rinse  15 mL Mouth Rinse QID  . chlorhexidine  15 mL Mouth/Throat BID  . insulin aspart  0-15 Units Subcutaneous Q4H  . lactulose  300 mL Rectal BID  . levothyroxine  56 mcg Intravenous Daily  . pantoprazole (PROTONIX) IV  40 mg Intravenous Q12H  . potassium chloride  10 mEq Intravenous Q1 Hr x 3  . sodium chloride  3 mL Intravenous Q12H   Continuous Infusions:   . sodium chloride 75 mL/hr at 10/04/11 1829   PRN Meds:.ondansetron (ZOFRAN) IV, ondansetron  Prior to Admission medications   Medication Sig Start Date End Date Taking? Authorizing Provider  atovaquone (MEPRON) 750 MG/5ML suspension Take 1,500 mg by mouth daily.   Yes Historical Provider, MD  clopidogrel (PLAVIX) 75 MG tablet Take 75 mg by mouth daily.     Yes Historical Provider, MD  docusate sodium (COLACE) 100 MG capsule Take 100 mg by mouth 2 (two) times daily.     Yes Historical Provider, MD  emtricitabine-tenofovir (TRUVADA) 200-300 MG per tablet Take 1 tablet by mouth daily.     Yes  Historical Provider, MD  famotidine (PEPCID) 20 MG tablet Take 20 mg by mouth 2 (two) times daily.     Yes Historical Provider, MD  fluconazole (DIFLUCAN) 200 MG tablet Take 200 mg by mouth daily.     Yes Historical Provider, MD  gabapentin (NEURONTIN) 300 MG capsule Take 300 mg by mouth 3 (three) times daily. **May increase to 2 capsules 3 times daily in 5 days**   Yes Historical Provider, MD  isosorbide mononitrate (IMDUR) 30 MG 24 hr tablet Take 30 mg by mouth daily.     Yes Historical Provider, MD  levothyroxine (SYNTHROID, LEVOTHROID) 112 MCG tablet Take 112 mcg by mouth daily.      Yes Historical Provider, MD  magnesium oxide (MAG-OX) 400 MG tablet Take 400 mg by mouth 3 (three) times daily.     Yes Historical Provider, MD  metoprolol succinate (TOPROL-XL) 25 MG 24 hr tablet Take 12.5 mg by mouth daily.     Yes Historical Provider, MD  polyethylene glycol (MIRALAX / GLYCOLAX) packet Take 17 g by mouth daily.     Yes Historical Provider, MD  pravastatin (PRAVACHOL) 40 MG tablet Take 40 mg by mouth daily.     Yes Historical Provider, MD  raltegravir (ISENTRESS) 400 MG tablet Take 400 mg by mouth 2 (two) times daily.     Yes Historical Provider, MD  senna-docusate (SENOKOT-S) 8.6-50 MG per tablet Take 2 tablets by mouth daily as needed for constipation. 04/26/11 04/25/12 Yes Ellouise Newer, PA  thiamine 100 MG tablet Take 100 mg by mouth daily.     Yes Historical Provider, MD  valACYclovir (VALTREX) 500 MG tablet Take 500 mg by mouth daily.     Yes Historical Provider, MD  acetaminophen (TYLENOL) 650 MG CR tablet Take 650 mg by mouth every 6 (six) hours as needed.      Historical Provider, MD  nitroGLYCERIN (NITROSTAT) 0.4 MG SL tablet Place 0.4 mg under the tongue every 5 (five) minutes as needed.      Historical Provider, MD  OLANZapine (ZYPREXA) 2.5 MG tablet Take 1 tablet (2.5 mg total) by mouth 3 (three) times daily as needed. For anxiety 05/13/11   Ellouise Newer, PA  prochlorperazine (COMPAZINE) 10 MG tablet Take 10 mg by mouth every 8 (eight) hours as needed. For nausea and vomiting    Historical Provider, MD  traZODone (DESYREL) 50 MG tablet Take 100 mg by mouth at bedtime as needed. For sleep     Historical Provider, MD  triamcinolone (KENALOG) 0.1 % cream Apply 1 application topically daily.      Historical Provider, MD  zolpidem (AMBIEN) 10 MG tablet Take 10 mg by mouth at bedtime as needed. To help sleep     Historical Provider, MD    Allergies:  Allergies  Allergen Reactions  . Xylocaine Rash    Patient had to be rushed to hospital from dentist office after  receiving xylocaine  . Trazodone And Nefazodone Other (See Comments)    Nightmares, hangover like feeling for 2-3 days    Family History  Problem Relation Age of Onset  . Colon cancer Mother   . Colon cancer Brother     Social History:  reports that he has been smoking Cigarettes.  He has a 10 pack-year smoking history. He does not have any smokeless tobacco history on file. He reports that he drinks alcohol. He reports that he does not use illicit drugs.  ROS: A complete review of systems was performed.  All systems are negative except for pertinent findings as noted.  Physical Exam:  Vital signs in last 24 hours: Temp:  [97.6 F (36.4 C)-97.8 F (36.6 C)] 97.8 F (36.6 C) (03/12 0600) Pulse Rate:  [80-95] 80  (03/12 0600) Resp:  [18-19] 19  (03/12 0600) BP: (126-157)/(68-80) 157/68 mmHg (03/12 0600) SpO2:  [94 %-96 %] 96 % (03/12 0600) General:  Alert; oriented to person only; No acute distress HEENT: Normocephalic, atraumatic Neck: No JVD or lymphadenopathy Cardiovascular: Regular rate and rhythm Lungs: Clear bilaterally Abdomen: Soft, nontender, nondistended, no abdominal masses; +BS Back: No CVA tenderness Extremities: No edema  Laboratory Data:   [facility] 10/03/11 0458  WBC 6.2  HGB 12.5*  HCT 35.1*  PLT 55*     Basename 10/04/11 0500 10/03/11 0458  NA 152* 145  K 3.2* 3.8  CL 120* 113*  GLUCOSE 119* 131*  BUN 62* 76*  CALCIUM 8.2* 7.7*  CREATININE 1.67* 2.40*      Recent Results (from the past 240 hour(s))  CULTURE, BLOOD (ROUTINE X 2)     Status: Normal   Collection Time   09/29/11 10:59 AM      Component Value Range Status Comment   Specimen Description BLOOD RIGHT ANTECUBITAL   Final    Special Requests BOTTLES DRAWN AEROBIC ONLY 6CC   Final    Culture NO GROWTH 5 DAYS   Final    Report Status 10/04/2011 FINAL   Final   CULTURE, BLOOD (ROUTINE X 2)     Status: Normal   Collection Time   09/29/11 11:07 AM      Component Value Range Status  Comment   Specimen Description BLOOD RIGHT ANTECUBITAL   Final    Special Requests BOTTLES DRAWN AEROBIC AND ANAEROBIC 6CC   Final    Culture NO GROWTH 5 DAYS   Final    Report Status 10/04/2011 FINAL   Final   MRSA PCR SCREENING     Status: Normal   Collection Time   09/29/11  5:45 PM      Component Value Range Status Comment   MRSA by PCR NEGATIVE  NEGATIVE  Final     Renal Function:  Basename 10/04/11 0500 10/03/11 0458 10/02/11 0500 10/01/11 0529 09/30/11 1858 09/30/11 1150 09/30/11 0441  CREATININE 1.67* 2.40* 2.45* 1.15 0.82 0.38* <0.20*   Estimated Creatinine Clearance: 55.4 ml/min (by C-G formula based on Cr of 1.67).  Radiologic Imaging: US Renal Port  10/03/2011  *RADIOLOGY REPORT*  Clinical Data: Elevated BUN and creatinine.  RENAL/URINARY TRACT ULTRASOUND COMPLETE  Comparison:  None.  Findings:  Right Kidney:  12.8 cm in length. No hydronephrosis.  Diffuse increased echogenicity suggesting medical renal disease.  No focal lesions.  Left Kidney:  14.3 cm in length.  Moderate hydronephrosis. Increased echogenicity suggesting medical renal disease. No renal calculi or focal lesions.  Bladder:  A Foley catheter is in place.  Additional findings:  Distended gallbladder is noted with gallstones and sludge. The spleen is enlarged with splenic volume of 766 ml. Small amount of free abdominal fluid.  IMPRESSION:  1.  Increased echogenicity of both kidneys consistent with medical renal disease. 2.  Left-sided hydronephrosis. 3.  Cholelithiasis. 4.  Splenomegaly. 5.  Small amount of free fluid.  Original Report Authenticated By: P. Loralie Champagne, M.D.    Impression/Assessment:  Left sided hydronephrosis with unknown etiology  Plan:  CT abd/pelvis wo contrast today to eval source of hydro and r/o nephrolithiasis  If CT is negative will  need to consider CT with contrast as renal function continues to improve.  He may also need retrogrades if no etiology has been identified.    Silas Flood 10/05/2011, 10:29 AM

## 2011-10-05 NOTE — Progress Notes (Signed)
With pt permission, CSW spoke with pt friend, listened to concerns she wanted to express and encouraged her to communicate directly with pt brother/legal guardian. Will follow up with pt brother.  CSW explained SNF placement process to pt brother and provided list of facilities, answered questions and explained placement process for this pt will not be easy (ie; likely will not have much choice).  Discussed placement and prognosis with MD.  Will continue to follow.  Baxter Flattery, MSW [phone]

## 2011-10-05 NOTE — Progress Notes (Addendum)
Patient ID: Troy Holden, male   DOB: 11-01-1950, 61 y.o.   MRN: [id number]  INTERIM SUMMARY  61 yo HIV+ with lymphoma who was transferred from [facility] to [facility] with acute changes in mental status and was intubated for airway protection. Initial thought was that his alterred mental status was secondary to suspected meningitis in immunocompromised host versus possible extension form sinusitis. There was no  evidence of acute stroke on brain MRI. No evidence of mass lesions. LP failed by IR. Toxicology was positive for opiates. Patient was initially admitted under PCCM service and was then  transferred to hospitalist service on 10/04/2011.  CONSULTS TO DATE:  1. PCCM 2. INFECTIOUS DISEASE 3. UROLOGY - consult called 10/05/11 for left sided hydronephrosis 4. PSYCHIATRY - for depression, capacity (called 10/05/2011) 4. PHYSICAL THERAPY   ASSESSMENT AND PLAN   Principal Problem:   *METABOLIC ENCEPHALOPATHY - likely secondary to underlying HIV dementia as there are no obvious sources of infectious process - at present patient is oriented to self and time  - Avoid sedation  - Treat hepatic encephalopathy with lactulose - however last night patient has refused this treatment as per RN - follow up SLP - recommended NPO; we will follow up on further recommendations; MBS maybe necessary for further evaluation - PT evaluation - recommended SNF - Psych consult appreciated for depression, capacity  Active Problems:  ACUTE RESPIRATORY FAILURE - stable at this time; saturating 98% on 2 L nasal canula - as per family patient is DNI ONLY  ACUTE KIDNEY INJURY - perhaps related to dehydration, HIV, other meds  - resolved, creatinine is 0.94 today - renal US shows left sided hydronephrosis - GU consult appreciated; addendum - recommendation was for CT abd/pelvis - please follow up - continue to monitor kidney function  HYPERNATREMIA, HYPOKALEMIA, METABOLIC ACIDOSIS - hyponatremia resolved and now  hypernatremia; awaiting labs from this am to see the trend; currently patient is receiving 1/2 NS (ADDENDUM: follow up labs show NA 152; we will change to NS @ 100 cc /hr and follow up  In am; check urine osm, urine electrolytes) - potassium yesterday 3.2, repleted and awaiting labs this am (addendum: Potassium was 2.6; gave 40 meq PO x 1 dose and potassium chloride IV x 4 doses 10 meq) - follow up am labs  ABNORMAL LIVER FUNCTION TESTS - perhaps secondary to shock liver, on admission  procalcitonin level was elevated and concern for possible sepsis; medications, ingestions, autoimmune. - Acute hep panel-neg thus far  - ALT trended down from ~ 3,000 to 1,799 - AST trending down as well - obtain abdominal ultrasound  INFECTIOUS  - No clear infectious source. HIV+. CD4 = 30; Holding HAART due to hepatitis. Patient will need MAC prophylaxis once hepatitis resolves.  - patietn remains afebrile and WBC count is within normal limits - blood cultures to date are negative - ID is following    EDUCATION - test results and diagnostic studies were discussed with patient's family (brother who has guardianship of Troy Holden). Patient's brother explained to me that Troy Holden's behavior and changes in mental status have escalated in last 1-2 years where he goes through periods of depression and agitation. He understands that at this point the safest option for discharge is a SNF (instead of ALF where patient was before). Troy Holden also reported that should he again become unresponsive patient's wish was not to be connected to mechanical ventilator as he understands that chances of  Meaningful recovery are small. He does report  that if brief course of CPR does not revive him than the family is "ok to let him go" as they would not proceed with Mechanical Ventilation per patients wishes. My conclusion to this statement is that the family agrees with DNI only as an advance directive.   Subjective: No events  overnight. Patient denies chest pain, shortness of breath, abdominal pain. RN reported patient took out IJ and has refused lactulose.  Objective:  Vital signs in last 24 hours:   10/04/11 0627 10/04/11 1800 10/04/11 2200 10/05/11 0600  BP: 117/69 126/80 147/74 157/68  Pulse: 95 95 90 80  Temp: 98.7 F (37.1 C) 97.8 F (36.6 C) 97.6 F (36.4 C) 97.8 F (36.6 C)  TempSrc: Axillary Oral Oral Oral  Resp: 18 18 19 19   SpO2: 95% 94% 95% 96%    Intake/Output from previous day:   Gross per 24 hour  Intake   1410 ml  Output   3950 ml  Net  -2540 ml    Physical Exam: General: Alert, awake, oriented x2 (time and person), in no acute distress. HEENT: No bruits, no goiter. Dry mucous membranes, no scleral icterus, no conjunctival pallor. Heart: Regular rate and rhythm, S1/S2 +, no murmurs, rubs, gallops. Lungs: Clear to auscultation bilaterally. No wheezing, no rhonchi, no rales.  Abdomen: Soft, nontender, nondistended, positive bowel sounds. Extremities: No clubbing or cyanosis, no pitting edema,  positive pedal pulses. Neuro: Grossly nonfocal.  Lab Results:    Lab 10/03/11 0458 10/02/11 0500 10/01/11 0529 09/30/11 1604 09/30/11 0441  WBC 6.2 12.2* 12.8* 10.0 7.1  HGB 12.5* 14.5 14.1 15.2 15.8  HCT 35.1* 40.8 40.5 42.9 45.2  PLT 55* clumps 120* 172 114*  MCV 85.0 84.3 86.4 85.1 85.6    Lab 10/04/11 0500 10/03/11 0458 10/02/11 0500 10/01/11 0529 09/30/11 1858  NA 152* 145 139 134* 132*  K 3.2* 3.8 4.4 3.4* 3.8  CL 120* 113* 107 104 105  CO2 23 21 18* 18* 16*  GLUCOSE 119* 131* 105* 206* 172*  BUN 62* 76* 62* 26* 17  CREATININE 1.67* 2.40* 2.45* 1.15 0.82  CALCIUM 8.2* 7.7* 7.8* 7.1* 7.9*   Lab 09/30/11 0124 09/29/11 1756 09/29/11 1055  CKMB 16.7* 14.3* --  TROPONINI <0.30 <0.30 <0.30    CULTURE, BLOOD (ROUTINE X 2)     Status: Normal   09/29/11 10:59 AM   Specimen Description BLOOD    Final    Culture NO GROWTH 5 DAYS   Final    Report Status 10/04/2011 FINAL    Final   CULTURE, BLOOD (ROUTINE X 2)     Status: Normal   09/29/11 11:07 AM   Specimen Description BLOOD RIGHT   Final    Culture NO GROWTH 5 DAYS   Final    Report Status 10/04/2011 FINAL   Final    MRSA by PCR NEGATIVE  NEGATIVE  Final     Studies/Results: US Renal Port 10/03/2011  IMPRESSION:  1.  Increased echogenicity of both kidneys consistent with medical renal disease. 2.  Left-sided hydronephrosis. 3.  Cholelithiasis. 4.  Splenomegaly. 5.  Small amount of free fluid.      Medications: Scheduled Meds:   . insulin aspart  0-15 Units Subcutaneous Q4H  . lactulose  300 mL Rectal BID  . levothyroxine  56 mcg Intravenous Daily  . pantoprazole (PROTONIX) IV  40 mg Intravenous Q12H  . potassium chloride  10 mEq Intravenous Q1 Hr x 3     LOS: 6 days  Dina Mobley 10/05/2011, 7:40 AM  [facility] Pager: [phone]

## 2011-10-05 NOTE — Progress Notes (Signed)
Pt refused lactulose enema. Called hospitalist and order changed to PO since he is now able to take oral medicines. Pt was able to take medicine with no problem

## 2011-10-06 LAB — GLUCOSE, CAPILLARY
Glucose-Capillary: 143 mg/dL — ABNORMAL HIGH (ref 70–99)
Glucose-Capillary: 107 mg/dL — ABNORMAL HIGH (ref 70–99)
Glucose-Capillary: 151 mg/dL — ABNORMAL HIGH (ref 70–99)

## 2011-10-06 LAB — CREATININE, URINE, 24 HOUR
Collection Interval-UCRE24: 24 hours
Creatinine, 24H Ur: 1103 mg/d (ref 800–2000)
Creatinine, Urine: 59.61 mg/dL
Urine Total Volume-UCRE24: 1850 mL

## 2011-10-06 LAB — AMMONIA: Ammonia: 52 umol/L (ref 11–60)

## 2011-10-06 MED ORDER — POTASSIUM CHLORIDE 2 MEQ/ML IV SOLN
INTRAVENOUS | Status: DC
  Administered 2011-10-06 – 2011-10-07 (×2): via INTRAVENOUS
  Filled 2011-10-06 (×6): qty 1000

## 2011-10-06 MED ORDER — RISPERIDONE 0.5 MG PO TABS
0.5000 mg | ORAL_TABLET | Freq: Every day | ORAL | Status: DC
  Administered 2011-10-06 – 2011-10-07 (×2): 0.5 mg via ORAL
  Filled 2011-10-06 (×3): qty 1

## 2011-10-06 NOTE — Progress Notes (Signed)
INFECTIOUS DISEASE PROGRESS NOTE  ID: Troy Holden is a 61 y.o. male with  Principal Problem:  *Toxic metabolic encephalopathy Active Problems:  DLBCL (diffuse large B cell lymphoma)  HIV (human immunodeficiency virus infection)  Dementia  CAD (coronary artery disease)  Hypothyroidism  Dyslipidemia  Elevated LFTs  Hyponatremia  Metabolic acidosis  Subjective: Awake, answers questions appropriately.  Continues to be a bit slow to respond.   Abtx:  Anti-infectives     Start     Dose/Rate Route Frequency Ordered Stop   10/03/11 1000   fluconazole (DIFLUCAN) IVPB 100 mg  Status:  Discontinued        100 mg 50 mL/hr over 60 Minutes Intravenous Daily 10/02/11 1349 10/03/11 1023   10/02/11 1200   atovaquone (MEPRON) 750 MG/5ML suspension 1,500 mg  Status:  Discontinued        1,500 mg Per Tube Daily with breakfast 10/01/11 0957 10/04/11 0920   10/01/11 1100   emtricitabine-tenofovir (TRUVADA) 200-300 MG per tablet 1 tablet  Status:  Discontinued        1 tablet Oral Daily 10/01/11 0957 10/01/11 1211   10/01/11 1100   raltegravir (ISENTRESS) tablet 400 mg  Status:  Discontinued        400 mg Oral 2 times daily 10/01/11 0957 10/01/11 1211   10/01/11 1100   fluconazole (DIFLUCAN) IVPB 200 mg  Status:  Discontinued        200 mg 100 mL/hr over 60 Minutes Intravenous Daily 10/01/11 0957 10/02/11 1349   10/01/11 0000   ampicillin (OMNIPEN) 2 g in sodium chloride 0.9 % 50 mL IVPB  Status:  Discontinued        2 g 150 mL/hr over 20 Minutes Intravenous Every 4 hours 09/30/11 1855 10/01/11 1448   09/30/11 1400   acyclovir (ZOVIRAX) 800 mg in dextrose 5 % 150 mL IVPB  Status:  Discontinued        10 mg/kg  80 kg (Order-Specific) 166 mL/hr over 60 Minutes Intravenous 3 times per day 09/30/11 1136 10/01/11 1021   09/30/11 0200   flucytosine (ANCOBON) capsule 1,000 mg  Status:  Discontinued        1,000 mg Oral Every 6 hours 09/30/11 0113 09/30/11 0734   09/30/11 0115   amphotericin B  (FUNGIZONE) 80 mg in dextrose 5 % 500 mL IVPB  Status:  Discontinued        1 mg/kg  80.2 kg 125 mL/hr over 4 Hours Intravenous Every 24 hours 09/30/11 0113 10/01/11 1002   09/30/11 0000   vancomycin (VANCOCIN) IVPB 1000 mg/200 mL premix  Status:  Discontinued        1,000 mg 200 mL/hr over 60 Minutes Intravenous Every 8 hours 09/29/11 1755 10/01/11 0851   09/29/11 2000   cefTRIAXone (ROCEPHIN) 2 g in dextrose 5 % 50 mL IVPB  Status:  Discontinued        2 g 100 mL/hr over 30 Minutes Intravenous Every 12 hours 09/29/11 1755 10/01/11 1021   09/29/11 1800   Ampicillin-Sulbactam (UNASYN) 3 g in sodium chloride 0.9 % 100 mL IVPB  Status:  Discontinued        3 g 100 mL/hr over 60 Minutes Intravenous Every 6 hours 09/29/11 1755 09/30/11 1855   09/29/11 1730   dextrose 5 % 600 mL with azithromycin (ZITHROMAX) 1,200 mg infusion  Status:  Discontinued        450 mL/hr  Intravenous Every 7 days 09/29/11 1720 10/01/11 1002   09/29/11 1715  acyclovir (ZOVIRAX) 905 mg in dextrose 5 % 150 mL IVPB  Status:  Discontinued        10 mg/kg  90.7 kg 168.1 mL/hr over 60 Minutes Intravenous 3 times per day 09/29/11 1709 09/30/11 1136   09/29/11 1215  piperacillin-tazobactam (ZOSYN) IVPB 3.375 g       3.375 g 12.5 mL/hr over 240 Minutes Intravenous  Once 09/29/11 1206 09/29/11 1827   09/29/11 1215   vancomycin (VANCOCIN) IVPB 1000 mg/200 mL premix        1,000 mg 200 mL/hr over 60 Minutes Intravenous  Once 09/29/11 1206 09/29/11 1606          Medications:  Scheduled:    . antiseptic oral rinse  15 mL Mouth Rinse QID  . chlorhexidine  15 mL Mouth/Throat BID  . insulin aspart  0-15 Units Subcutaneous Q4H  . levothyroxine  112 mcg Oral Daily  . pantoprazole  40 mg Oral Q1200  . potassium chloride  10 mEq Intravenous Q1 Hr x 4  . potassium chloride  40 mEq Oral Once  . sodium chloride  3 mL Intravenous Q12H  . DISCONTD: lactulose  30 g Oral Q6H  . DISCONTD: lactulose  30 g Oral TID  .  DISCONTD: lactulose  300 mL Rectal BID    Objective: Vital signs in last 24 hours: Temp:  [98.1 F (36.7 C)-99.5 F (37.5 C)] 98.6 F (37 C) (03/13 1445) Pulse Rate:  [78-116] 116  (03/13 1445) Resp:  [18-22] 20  (03/13 1445) BP: (117-169)/(71-86) 125/86 mmHg (03/13 1445) SpO2:  [95 %-97 %] 95 % (03/13 1445)   Gen - in bed, nad Heent - anicteric, no oral lesions CV - rrr without m/r/g Lungs - CTA B Abd soft, ntnd, +bs Ext - no edema  Lab Results  Basename 10/05/11 1400 10/04/11 0500  WBC 4.5 --  HGB 11.5* --  HCT 33.3* --  NA 152* 152*  K 2.6* 3.2*  CL 116* 120*  CO2 24 23  BUN 31* 62*  CREATININE 0.94 1.67*  GLU -- --   Liver Panel  Basename 10/04/11 0500  PROT 4.9*  ALBUMIN 2.7*  AST 70*  ALT 1799*  ALKPHOS 112  BILITOT 1.6*  BILIDIR --  IBILI --   Sedimentation Rate No results found for this basename: ESRSEDRATE in the last 72 hours C-Reactive Protein No results found for this basename: CRP:2 in the last 72 hours  Microbiology: Recent Results (from the past 240 hour(s))  CULTURE, BLOOD (ROUTINE X 2)     Status: Normal   Collection Time   09/29/11 10:59 AM      Component Value Range Status Comment   Specimen Description BLOOD RIGHT ANTECUBITAL   Final    Special Requests BOTTLES DRAWN AEROBIC ONLY 6CC   Final    Culture NO GROWTH 5 DAYS   Final    Report Status 10/04/2011 FINAL   Final   CULTURE, BLOOD (ROUTINE X 2)     Status: Normal   Collection Time   09/29/11 11:07 AM      Component Value Range Status Comment   Specimen Description BLOOD RIGHT ANTECUBITAL   Final    Special Requests BOTTLES DRAWN AEROBIC AND ANAEROBIC [facility]   Final    Culture NO GROWTH 5 DAYS   Final    Report Status 10/04/2011 FINAL   Final   MRSA PCR SCREENING     Status: Normal   Collection Time   09/29/11  5:45 PM  Component Value Range Status Comment   MRSA by PCR NEGATIVE  NEGATIVE  Final     Studies/Results: Ct Abdomen Pelvis Wo Contrast  10/05/2011  *RADIOLOGY  REPORT*  Clinical Data: Evaluate left hydronephrosis on ultrasound  CT ABDOMEN AND PELVIS WITHOUT CONTRAST  Technique:  Multidetector CT imaging of the abdomen and pelvis was performed following the standard protocol without intravenous contrast.  Comparison: Ultrasound dated 10/05/2011  Findings: Patchy ground-glass opacities in the lung bases, suspicious for multifocal pneumonia or less likely interstitial edema.  Trace left pleural effusion.  Hepatic steatosis.  Splenomegaly, measuring 16.0 cm in craniocaudal dimension.  Pancreas and adrenal glands are grossly unremarkable.  Cholelithiasis, without associated inflammatory changes.  No intrahepatic or extrahepatic ductal dilatation.  Mild left hydronephrosis with associated perinephric stranding. Right kidney is unremarkable.  No renal calculi are seen.  No evidence of bowel obstruction.  Atherosclerotic calcifications of the abdominal aorta and branch vessels.  No abdominopelvic ascites.  No suspicious abdominopelvic lymphadenopathy.  Prostate is unremarkable.  No ureteral or bladder calculi are seen.  Bladder is decompressed with indwelling Foley catheter and associated nondependent gas.  Mild degenerative changes of the visualized thoracolumbar spine, most prominent at L5-S1.  IMPRESSION: Mild left hydronephrosis with associated perinephric stranding. The etiology of this finding is not clear, but could reflect infection, prior ureteral calculus, or less likely recent vascular insult.  No renal, ureteral, or bladder calculi are seen.  Bladder is decompressed by indwelling Foley catheter.  Patchy ground-glass opacities in the lung bases, suspicious for multifocal pneumonia or less likely interstitial edema.  Trace left pleural effusion.  Additional stable ancillary findings as above.  Original Report Authenticated By: Charline Bills, M.D.   US Abdomen Complete  10/05/2011  *RADIOLOGY REPORT*  Clinical Data:  Abnormal liver function tests.  ABDOMINAL  ULTRASOUND COMPLETE  Comparison:  Renal ultrasound 10/03/2011  Findings:  Gallbladder:  A 1.8 cm gallstone is seen near the fundus. Gallbladder wall thickness is an approximately 3.3 to 3.6 mm.  The sonographic Murphy's sign is negative.  Common Bile Duct:  Within normal limits in caliber. Measures 3.4 mm.  Liver: No focal mass lesion identified.  Within normal limits in parenchymal echogenicity. No evidence of ductal dilatation.  IVC:  Appears normal.  Pancreas:  Although the pancreas is difficult to visualize in its entirety, no focal pancreatic abnormality is identified.  Spleen:  The spleen is enlarged; and measures 13.2 cm in length and has a approximate calculated volume of 963.6 cm cubed.  Right kidney:  Measures 0.0 cm in length.  Echogenicity of the renal cortex is slightly increased.  There is no evidence of cortical thinning or scarring.  No evidence of massor hydronephrosis.  Left kidney:  Measures 14.0 cm in length.  Echogenicity of the renal cortex is slightly increased.There is mild left hydronephrosis.  Abdominal Aorta:  No aneurysm identified.Maximal AP diameter is 2.5 cm.  IMPRESSION: 1.  The liver has normal sonographic appearances.  No definite etiology for elevated LFTs is identified. 2.  Mild left hydronephrosis. 3.  Slight increased echogenicity of both kidneys suggest medical renal disease. 4.  Cholelithiasis.  No definite sonographic evidence of cholecystitis. 5.  Splenomegaly.  Original Report Authenticated By: Britta Mccreedy, M.D.   Dg Swallowing Func-no Report  10/05/2011  CLINICAL DATA: dysphagia   FLUOROSCOPY FOR SWALLOWING FUNCTION STUDY:  Fluoroscopy was provided for swallowing function study, which was  administered by a speech pathologist.  Final results and recommendations  from this study are contained  within the speech pathology report.       Assessment/Plan: HIV - CD 4 is 30, viral load undetectable, which suggests he had been taking his meds.  Holding ARV due to hepatitis  though.  Patient has consented to obtaining records from [facility].  He will need MAC prophylaxis once hepatitis resolves. Also will need to restart Atovaquone when hepaitits resolves.  No recent LFTs.    Hepatitis - unclear etiology.  Medications, ingestions, autoimmune.  Patient denies any ingestion.    Encephalopathy - unclear etiology, though is slowly recovering.  As above, ingestion a possibility.    Disposition - patient expressed to me that he will need a new place to live, for SNF.    He will need to restart ARV therapy by his [facility] ID provider so should get an appointment soon.  I would wait though for LFTs to resolve.    Please call if I can be of further assistance.  thanks    Staci Righter Infectious Diseases [phone] 10/06/2011, 4:30 PM

## 2011-10-06 NOTE — Progress Notes (Addendum)
Patient ID: Troy Holden, male   DOB: January 03, 1951, 61 y.o.   MRN: [id number]  Assuming care today  INTERIM SUMMARY  61 yo HIV+ with lymphoma who was transferred from [facility] to [facility] with acute changes in mental status and was intubated for airway protection. Initial thought was that his alterred mental status was secondary to suspected meningitis in immunocompromised host versus possible extension form sinusitis. There was no  evidence of acute stroke on brain MRI. No evidence of mass lesions. LP failed by IR. Toxicology was positive for opiates. Patient was initially admitted under PCCM service and was then  transferred to hospitalist service on 10/04/2011.  CONSULTS TO DATE:  1. PCCM 2. INFECTIOUS DISEASE 3. UROLOGY - consult called 10/05/11 for left sided hydronephrosis 4. PSYCHIATRY - for depression, capacity  4. PHYSICAL THERAPY   ASSESSMENT AND PLAN   Principal Problem:   *METABOLIC ENCEPHALOPATHY - likely secondary to underlying HIV dementia as there are no obvious sources of infectious process, MRI negative LP attempted x3 per IR under fluoroscopy: unsuccessful Mental status improving  doubt hepatic encephalopathy: ammonia level was 8 on admission, will DC lactulose, repeat ammonia level Diet per speech - Psych consult appreciated for depression, capacity Add low dose risperidone at bedtime for agitation  Active Problems:  ACUTE RESPIRATORY FAILURE: s/p VRDF for airway protection - stable at this time; saturating 98% on 2 L nasal canula - as per family patient is DNI ONLY  ACUTE KIDNEY INJURY - related to dehydration, HIV, other meds  - resolved - CT/USG with Mild L hydronephrosis, Gu following, renal function normal now  HYPERNATREMIA, HYPOKALEMIA, METABOLIC ACIDOSIS DC NS, change to D5W, replace K  ABNORMAL LIVER FUNCTION TESTS - perhaps secondary to shock liver, on admission  procalcitonin level was elevated and concern for possible sepsis; medications, ingestions,  autoimmune. Trending down, CT/Usg without evidence of obstruction, CMP in am  INFECTIOUS  - No clear infectious source. HIV+. CD4 = 30; Holding HAART due to hepatitis. Patient will need MAC prophylaxis once hepatitis resolves.  - patietn remains afebrile - blood cultures to date are negative - ID is following    EDUCATION - test results and diagnostic studies were discussed with patient's family (brother who has guardianship of Troy Holden) by Dr.Devime yesterday,  Patient's brother relayed that Troy Holden's behavior and changes in mental status have escalated in last 1-2 years where he goes through periods of depression and agitation. He understands that at this point the safest option for discharge is a SNF (instead of ALF where patient was before). He also reported that should he again become unresponsive patient's wish was not to be connected to mechanical ventilator as he understands that chances of  Meaningful recovery are small. He does report that if brief course of CPR does not revive him than the family is "ok to let him go" as they would not proceed with Mechanical Ventilation per patients wishes.  DNI only as an advance directive.  PT/OT consult Subjective: No events overnight, expect for some agitation  Objective:  Vital signs in last 24 hours:   10/04/11 0627 10/04/11 1800 10/04/11 2200 10/05/11 0600  BP: 117/69 126/80 147/74 157/68  Pulse: 95 95 90 80  Temp: 98.7 F (37.1 C) 97.8 F (36.6 C) 97.6 F (36.4 C) 97.8 F (36.6 C)  TempSrc: Axillary Oral Oral Oral  Resp: 18 18 19 19   SpO2: 95% 94% 95% 96%    Intake/Output from previous day:   Gross per 24 hour  Intake   1410 ml  Output   3950 ml  Net  -2540 ml    Physical Exam: General: Alert, awake, oriented x2 (time and person), in no acute distress. HEENT: No bruits, no goiter. Dry mucous membranes, no scleral icterus, no conjunctival pallor. Heart: Regular rate and rhythm, S1/S2 +, no murmurs, rubs,  gallops. Lungs: Clear to auscultation bilaterally. No wheezing, no rhonchi, no rales.  Abdomen: Soft, nontender, nondistended, positive bowel sounds. Extremities: No clubbing or cyanosis, no pitting edema,  positive pedal pulses. Neuro: Grossly nonfocal.  Lab Results:    Lab 10/03/11 0458 10/02/11 0500 10/01/11 0529 09/30/11 1604 09/30/11 0441  WBC 6.2 12.2* 12.8* 10.0 7.1  HGB 12.5* 14.5 14.1 15.2 15.8  HCT 35.1* 40.8 40.5 42.9 45.2  PLT 55* clumps 120* 172 114*  MCV 85.0 84.3 86.4 85.1 85.6    Lab 10/04/11 0500 10/03/11 0458 10/02/11 0500 10/01/11 0529 09/30/11 1858  NA 152* 145 139 134* 132*  K 3.2* 3.8 4.4 3.4* 3.8  CL 120* 113* 107 104 105  CO2 23 21 18* 18* 16*  GLUCOSE 119* 131* 105* 206* 172*  BUN 62* 76* 62* 26* 17  CREATININE 1.67* 2.40* 2.45* 1.15 0.82  CALCIUM 8.2* 7.7* 7.8* 7.1* 7.9*   Lab 09/30/11 0124 09/29/11 1756 09/29/11 1055  CKMB 16.7* 14.3* --  TROPONINI <0.30 <0.30 <0.30    CULTURE, BLOOD (ROUTINE X 2)     Status: Normal   09/29/11 10:59 AM   Specimen Description BLOOD    Final    Culture NO GROWTH 5 DAYS   Final    Report Status 10/04/2011 FINAL   Final   CULTURE, BLOOD (ROUTINE X 2)     Status: Normal   09/29/11 11:07 AM   Specimen Description BLOOD RIGHT   Final    Culture NO GROWTH 5 DAYS   Final    Report Status 10/04/2011 FINAL   Final    MRSA by PCR NEGATIVE  NEGATIVE  Final     Studies/Results: US Renal Port 10/03/2011  IMPRESSION:  1.  Increased echogenicity of both kidneys consistent with medical renal disease. 2.  Left-sided hydronephrosis. 3.  Cholelithiasis. 4.  Splenomegaly. 5.  Small amount of free fluid.      Medications: Scheduled Meds:   . insulin aspart  0-15 Units Subcutaneous Q4H  . lactulose  300 mL Rectal BID  . levothyroxine  56 mcg Intravenous Daily  . pantoprazole (PROTONIX) IV  40 mg Intravenous Q12H  . potassium chloride  10 mEq Intravenous Q1 Hr x 3     LOS: 7 days   Jenisa Monty 10/06/2011, 8:58  AM  [facility] Pager: [phone]

## 2011-10-06 NOTE — Progress Notes (Signed)
Physical Therapy Treatment Patient Details Name: Troy Holden MRN: [id number] DOB: 1950/08/04 Today's Date: 10/06/2011  PT Assessment/Plan  PT - Assessment/Plan Comments on Treatment Session: Pt transfer to bedside commode for BM.  Patient required max assist for perianal hygiene.  Patient tolerated walking 20 feet. PT Plan: Discharge plan remains appropriate PT Frequency: Min 3X/week Follow Up Recommendations: Skilled nursing facility Equipment Recommended: None recommended by PT PT Goals  Acute Rehab PT Goals PT Goal Formulation: With patient Time For Goal Achievement: 2 weeks Pt will go Supine/Side to Sit: with supervision PT Goal: Supine/Side to Sit - Progress: Progressing toward goal Pt will Sit at [facility] of Bed: with supervision;3-5 min PT Goal: Sit at Edge Of Bed - Progress: Progressing toward goal Pt will go Sit to Stand: with min assist;from elevated surface PT Goal: Sit to Stand - Progress: Progressing toward goal Pt will go Stand to Sit: with min assist;to elevated surface;with upper extremity assist PT Goal: Stand to Sit - Progress: Progressing toward goal Pt will Transfer Bed to Chair/Chair to Bed: with min assist PT Transfer Goal: Bed to Chair/Chair to Bed - Progress: Progressing toward goal Pt will Stand: with supervision;1 - 2 min PT Goal: Stand - Progress: Met Pt will Ambulate: 16 - 50 feet;with min assist;with rolling walker PT Goal: Ambulate - Progress: Met  PT Treatment Precautions/Restrictions  Precautions Precautions: Fall Restrictions Weight Bearing Restrictions: No Mobility (including Balance) Bed Mobility Bed Mobility: Yes Supine to Sit: Other (comment) (Min guard) Supine to Sit Details (indicate cue type and reason): Pt used railing to roll onto right side and sit up.  Patient requires min guard for safety. Transfers Transfers: Yes Sit to Stand: 3: Mod assist;From bed;From elevated surface;With upper extremity assist Sit to Stand Details (indicate  cue type and reason): Patient initiall requires Mod Assist to transfer from bed to bedside commode.  Patient then required min assist to transfer to/from chair. Stand to Sit: 4: Min assist Ambulation/Gait Ambulation/Gait: Yes Ambulation/Gait Assistance: 4: Min assist;  Pt needed (A) for RW placement and safety especially with turns. Ambulation Distance (Feet): 20 Feet Assistive device: Rolling walker Gait Pattern: Step-to pattern;Decreased stride length;Shuffle Stairs: No  Posture/Postural Control Posture/Postural Control: No significant limitations Balance Balance Assessed: No Exercise    End of Session PT - End of Session Equipment Utilized During Treatment: Gait belt Activity Tolerance: Patient tolerated treatment well Patient left: in chair;with call bell in reach General Behavior During Session: [facility] for tasks performed Cognition: Impaired  Ezzard Standing SPT 10/06/2011, 3:38 PM  [place], PT DPT [phone]

## 2011-10-06 NOTE — Progress Notes (Signed)
Pt is refusing AM labs. States "doctors are quacks" and that they "don't know what they are doing." He states he does not want them taking care of him. The only MD he wishes to take care of him is his oncologist at [facility]. Pt stated he would speak with Darl Pikes (states she is his "mental health facilitator") this AM about lab work. Pt very agitated with questioning.

## 2011-10-06 NOTE — Progress Notes (Signed)
CSW advised by MD pt may be ready for d/c as soon as Friday. SNF search is in process, and CSW has submitted PASSAR request and awaiting response: if pt needs Level II PASSAR, d/c Friday is unlikely.   CSW spoke with pt brother to provide bed offers [facility]). Pt brother will tour facilities and make a decision as to preference by tomorrow.   Pt brother with numerous questions for MD regarding pt medical care at time of d/c and thereafter, particularly around the issue of pt HIV medications which were d/c'd due to liver function abnormalities. Pt brother also concerned about pt psychosis and states pt was on Abilify in the past with relative success. CSW note psych recommended Risperdal and will discuss with MD. Pt brother requesting to speak with MD, and CSW will pass along concerns.   Pt has been followed by Dr Lurlean Leyden with ID at [facility] prior to admission (? Pt need to become established with local ID doc if pt is to stay in [place] area). CSW will discuss with NCM. Pt will need f-u and has no PCP as of present.   CSW will continue to follow.   Baxter Flattery, MSW [phone]

## 2011-10-06 NOTE — Progress Notes (Signed)
Nutrition Follow-up  Pt is in somewhat better spirits today. Documented PO intake <25%. Pt states he just does not want to eat. Pt is okay with the foods but does not like having to get mechanical soft meals. He wants to eat regular foods like he did PTA. Asked pt if he would be willing to eat magic cup-pt is agreeable to this.  Pt may be ready for discharge by Friday, 3/15, per Social Work note.  Diet Order:  Dysphagia 3, nectar thick liquids  Meds: Scheduled Meds:   . antiseptic oral rinse  15 mL Mouth Rinse QID  . chlorhexidine  15 mL Mouth/Throat BID  . insulin aspart  0-15 Units Subcutaneous Q4H  . levothyroxine  112 mcg Oral Daily  . pantoprazole  40 mg Oral Q1200  . potassium chloride  10 mEq Intravenous Q1 Hr x 4  . potassium chloride  40 mEq Oral Once  . sodium chloride  3 mL Intravenous Q12H  . DISCONTD: lactulose  30 g Oral Q6H  . DISCONTD: lactulose  30 g Oral TID  . DISCONTD: lactulose  300 mL Rectal BID   Continuous Infusions:   . dextrose 5 % 1,000 mL with potassium chloride 40 mEq infusion 75 mL/hr at 10/06/11 1133  . DISCONTD: sodium chloride 100 mL/hr at 10/06/11 0503   PRN Meds:.food thickener, ondansetron (ZOFRAN) IV, ondansetron  Labs:  CMP     Component Value Date/Time   NA 152* 10/05/2011 1400   K 2.6* 10/05/2011 1400   CL 116* 10/05/2011 1400   CO2 24 10/05/2011 1400   GLUCOSE 183* 10/05/2011 1400   BUN 31* 10/05/2011 1400   CREATININE 0.94 10/05/2011 1400   CALCIUM 8.5 10/05/2011 1400   PROT 4.9* 10/04/2011 0500   ALBUMIN 2.7* 10/04/2011 0500   AST 70* 10/04/2011 0500   ALT 1799* 10/04/2011 0500   ALKPHOS 112 10/04/2011 0500   BILITOT 1.6* 10/04/2011 0500   GFRNONAA 90* 10/05/2011 1400   GFRAA >90 10/05/2011 1400  Phosphorus:  5.2 on 10/01/11 (high) Magnesium: 2.2 on 10/01/11 (WNL)  CBG (last 3)   Basename 10/06/11 1144 10/06/11 0344 10/06/11 0003  GLUCAP 143* 135* 164*     Intake/Output Summary (Last 24 hours) at 10/06/11 1626 Last data filed at  10/06/11 0400  Gross per 24 hour  Intake    560 ml  Output    950 ml  Net   -390 ml    Weight Status:  No new weight  Re-estimated needs:  2050-2250 kcal and 124-150 grams protein  Nutrition Dx:   1- Inadequate oral intake, ongoing. 2- Malnutrition, ongoing.  Goal:  Intake to meet 90-100% of estimated nutrition needs, not met.  Intervention:    Recommend re-checking magnesium and phosphorus.  Order magic cup TID with meals to increase PO intake  Monitor:  Diet advancement, weight trend, labs, I/O's  Karenann Cai Pager #:  [phone]  Kendell Bane Cornelison [phone]

## 2011-10-06 NOTE — Progress Notes (Signed)
Speech Language/Pathology Speech Pathology: Dysphagia Treatment Note   Subjective:  Pt. in bed, calm, friend at bedside  Objective:  Treatment goal focusing on pt.'s safety and efficiency with diet rec's after MBS yesterday.  Observed with OJ thickened with Thicken Up Clear powder.  Pt. required mod-max cues to decrease rate, sip size, to accurate performance of chin tuck (required question cues to recall chin tuck strategy), and double swallow with clinical rationale provided.  Pt.'s vocal quality remained clear, no coughing or throat clearing indicative of adequate airway protection.  He refused to eat the graham cracker stating he doesn't like them.  Temperature: 98.2 Lung sounds:  Not yet recorded  Clinical Impression:  Pt. appears to be protecting his airway as no s/s aspiration present although pt. needs continued cues during meals/snacks to follow swallow precautions and decrease aspiration risk.  SLP hung sign in view reminding pt. To tuck chin with liquids.   Recommendations: Continue Dys 3 diet, nectar thick liquids with full supervision chin tuck, slow rate and second swallow   Pain:   none Intervention Required:   No   Goals: No Goals Met  Royce Macadamia M.Ed [employer] [phone]  10/06/2011

## 2011-10-06 NOTE — Progress Notes (Signed)
Patient ID: Troy Holden, male   DOB: [birth year], 61 y.o.   MRN: [id number]   Subjective: Please see consult note from yesterday for patient's history. The patient subsequently has had a CT scan of the abdomen and pelvis without intravenous contrast. This was felt to have mild hydronephrosis with some potential inflammatory changes around the ureter and kidney. No obvious stone or other pathology was appreciated. Patient's creatinine is now back to normal.  Objective: Vital signs in last 24 hours: Temp:  [98.1 F (36.7 C)-98.7 F (37.1 C)] 98.2 F (36.8 C) (03/13 0553) Pulse Rate:  [81-99] 99  (03/13 0553) Resp:  [18-22] 22  (03/13 0553) BP: (146-169)/(71-89) 146/71 mmHg (03/13 0553) SpO2:  [98 %] 98 % (03/12 1356)  Intake/Output from previous day: 03/12 0701 - 03/13 0700 In: 720 [P.O.:520; I.V.:100; IV Piggyback:100] Out: 2100 [Urine:2100] Intake/Output this shift:    Physical Exam:  Constitutional: Vital signs reviewed.   Lab Results:  Basename 10/05/11 1400  HGB 11.5*  HCT 33.3*   BMET  Basename 10/05/11 1400 10/04/11 0500  NA 152* 152*  K 2.6* 3.2*  CL 116* 120*  CO2 24 23  GLUCOSE 183* 119*  BUN 31* 62*  CREATININE 0.94 1.67*  CALCIUM 8.5 8.2*   No results found for this basename: LABPT:3,INR:3 in the last 72 hours No results found for this basename: LABURIN:1 in the last 72 hours Results for orders placed during the hospital encounter of 09/29/11  CULTURE, BLOOD (ROUTINE X 2)     Status: Normal   Collection Time   09/29/11 10:59 AM      Component Value Range Status Comment   Specimen Description BLOOD RIGHT ANTECUBITAL   Final    Special Requests BOTTLES DRAWN AEROBIC ONLY 6CC   Final    Culture NO GROWTH 5 DAYS   Final    Report Status 10/04/2011 FINAL   Final   CULTURE, BLOOD (ROUTINE X 2)     Status: Normal   Collection Time   09/29/11 11:07 AM      Component Value Range Status Comment   Specimen Description BLOOD RIGHT ANTECUBITAL   Final    Special  Requests BOTTLES DRAWN AEROBIC AND ANAEROBIC 6CC   Final    Culture NO GROWTH 5 DAYS   Final    Report Status 10/04/2011 FINAL   Final   MRSA PCR SCREENING     Status: Normal   Collection Time   09/29/11  5:45 PM      Component Value Range Status Comment   MRSA by PCR NEGATIVE  NEGATIVE  Final     Studies/Results: Ct Abdomen Pelvis Wo Contrast  10/05/2011  *RADIOLOGY REPORT*  Clinical Data: Evaluate left hydronephrosis on ultrasound  CT ABDOMEN AND PELVIS WITHOUT CONTRAST  Technique:  Multidetector CT imaging of the abdomen and pelvis was performed following the standard protocol without intravenous contrast.  Comparison: Ultrasound dated 10/05/2011  Findings: Patchy ground-glass opacities in the lung bases, suspicious for multifocal pneumonia or less likely interstitial edema.  Trace left pleural effusion.  Hepatic steatosis.  Splenomegaly, measuring 16.0 cm in craniocaudal dimension.  Pancreas and adrenal glands are grossly unremarkable.  Cholelithiasis, without associated inflammatory changes.  No intrahepatic or extrahepatic ductal dilatation.  Mild left hydronephrosis with associated perinephric stranding. Right kidney is unremarkable.  No renal calculi are seen.  No evidence of bowel obstruction.  Atherosclerotic calcifications of the abdominal aorta and branch vessels.  No abdominopelvic ascites.  No suspicious abdominopelvic lymphadenopathy.  Prostate is  unremarkable.  No ureteral or bladder calculi are seen.  Bladder is decompressed with indwelling Foley catheter and associated nondependent gas.  Mild degenerative changes of the visualized thoracolumbar spine, most prominent at L5-S1.  IMPRESSION: Mild left hydronephrosis with associated perinephric stranding. The etiology of this finding is not clear, but could reflect infection, prior ureteral calculus, or less likely recent vascular insult.  No renal, ureteral, or bladder calculi are seen.  Bladder is decompressed by indwelling Foley catheter.   Patchy ground-glass opacities in the lung bases, suspicious for multifocal pneumonia or less likely interstitial edema.  Trace left pleural effusion.  Additional stable ancillary findings as above.  Original Report Authenticated By: Charline Bills, M.D.   US Abdomen Complete  10/05/2011  *RADIOLOGY REPORT*  Clinical Data:  Abnormal liver function tests.  ABDOMINAL ULTRASOUND COMPLETE  Comparison:  Renal ultrasound 10/03/2011  Findings:  Gallbladder:  A 1.8 cm gallstone is seen near the fundus. Gallbladder wall thickness is an approximately 3.3 to 3.6 mm.  The sonographic Murphy's sign is negative.  Common Bile Duct:  Within normal limits in caliber. Measures 3.4 mm.  Liver: No focal mass lesion identified.  Within normal limits in parenchymal echogenicity. No evidence of ductal dilatation.  IVC:  Appears normal.  Pancreas:  Although the pancreas is difficult to visualize in its entirety, no focal pancreatic abnormality is identified.  Spleen:  The spleen is enlarged; and measures 13.2 cm in length and has a approximate calculated volume of 963.6 cm cubed.  Right kidney:  Measures 0.0 cm in length.  Echogenicity of the renal cortex is slightly increased.  There is no evidence of cortical thinning or scarring.  No evidence of massor hydronephrosis.  Left kidney:  Measures 14.0 cm in length.  Echogenicity of the renal cortex is slightly increased.There is mild left hydronephrosis.  Abdominal Aorta:  No aneurysm identified.Maximal AP diameter is 2.5 cm.  IMPRESSION: 1.  The liver has normal sonographic appearances.  No definite etiology for elevated LFTs is identified. 2.  Mild left hydronephrosis. 3.  Slight increased echogenicity of both kidneys suggest medical renal disease. 4.  Cholelithiasis.  No definite sonographic evidence of cholecystitis. 5.  Splenomegaly.  Original Report Authenticated By: Britta Mccreedy, M.D.   Dg Swallowing Func-no Report  10/05/2011  CLINICAL DATA: dysphagia   FLUOROSCOPY FOR  SWALLOWING FUNCTION STUDY:  Fluoroscopy was provided for swallowing function study, which was  administered by a speech pathologist.  Final results and recommendations  from this study are contained within the speech pathology report.      Assessment/Plan:   At this point Mr. Lecrone appears to have resolving hydronephrosis. He did develop [place] on a decline in renal function which is now normalizing ultrasound did suggest moderate hydronephrosis but it was really quite minimal on subsequent CT imaging. There is no indication for any additional urologic assessment of treatment at this time. I would suggest that he have a followup ultrasound in about a month to make sure that the hydronephrosis has resolved. If there is still questionable hydronephrosis remaining in his renal function is within normal limits I would suggest CT of the abdomen and pelvis with IV contrast. We will sign off at this time.   LOS: 7 days   Tahliyah Anagnos S 10/06/2011, 12:55 PM

## 2011-10-07 ENCOUNTER — Encounter (HOSPITAL_COMMUNITY): Payer: Self-pay | Admitting: *Deleted

## 2011-10-07 LAB — CBC
HCT: 31.6 % — ABNORMAL LOW (ref 39.0–52.0)
MCHC: 34.8 g/dL (ref 30.0–36.0)
MCV: 86.6 fL (ref 78.0–100.0)
Platelets: 40 10*3/uL — ABNORMAL LOW (ref 150–400)
RDW: 15.4 % (ref 11.5–15.5)

## 2011-10-07 LAB — COMPREHENSIVE METABOLIC PANEL
AST: 33 U/L (ref 0–37)
Albumin: 2.5 g/dL — ABNORMAL LOW (ref 3.5–5.2)
BUN: 13 mg/dL (ref 6–23)
Creatinine, Ser: 0.72 mg/dL (ref 0.50–1.35)
Total Protein: 5.1 g/dL — ABNORMAL LOW (ref 6.0–8.3)

## 2011-10-07 LAB — GLUCOSE, CAPILLARY
Glucose-Capillary: 110 mg/dL — ABNORMAL HIGH (ref 70–99)
Glucose-Capillary: 124 mg/dL — ABNORMAL HIGH (ref 70–99)
Glucose-Capillary: 116 mg/dL — ABNORMAL HIGH (ref 70–99)

## 2011-10-07 MED ORDER — SODIUM CHLORIDE 0.45 % IV SOLN
INTRAVENOUS | Status: DC
  Administered 2011-10-08: 05:00:00 via INTRAVENOUS
  Filled 2011-10-07 (×3): qty 1000

## 2011-10-07 MED ORDER — POTASSIUM CHLORIDE CRYS ER 20 MEQ PO TBCR
40.0000 meq | EXTENDED_RELEASE_TABLET | Freq: Two times a day (BID) | ORAL | Status: DC
  Administered 2011-10-07 – 2011-10-08 (×3): 40 meq via ORAL
  Filled 2011-10-07 (×4): qty 2

## 2011-10-07 MED ORDER — INSULIN ASPART 100 UNIT/ML ~~LOC~~ SOLN: 0.0000 [IU] | Freq: Three times a day (TID) | SUBCUTANEOUS | Status: DC

## 2011-10-07 NOTE — Progress Notes (Signed)
CRITICAL VALUE ALERT  Critical value received:  K+ 2.1  Date of notification:  10/07/11  Time of notification: 0714  Critical value read back:yes  Nurse who received alert:  KPotts RN  MD notified (1st page):  [facility]  Time of first page: 0715   Responding MD:  Dr. Mitchel Honour  Time MD responded:  [phone]; orders given

## 2011-10-07 NOTE — Progress Notes (Signed)
Pt brother has selected a bed at [facility]. Bed available tomorrow. Plan for d/c after lunch, if pt is medically stable.  CSW will continue to follow.   Baxter Flattery, MSW [phone]

## 2011-10-07 NOTE — Progress Notes (Signed)
Patient ID: Troy Holden, male   DOB: 05-16-1951, 61 y.o.   MRN: [id number]  Assuming care today  INTERIM SUMMARY  61 yo HIV+ with lymphoma who was transferred [facility] with acute changes in mental status and was intubated for airway protection. Initial thought was that his alterred mental status was secondary to suspected meningitis in immunocompromised host versus possible extension form sinusitis. There was no  evidence of acute stroke on brain MRI. No evidence of mass lesions. LP failed by IR. Toxicology was positive for opiates. Patient was initially admitted under PCCM service and was then  transferred to hospitalist service on 10/04/2011.  CONSULTS TO DATE:  1. PCCM 2. INFECTIOUS DISEASE 3. UROLOGY - consult called 10/05/11 for left sided hydronephrosis 4. PSYCHIATRY - for depression, capacity  4. PHYSICAL THERAPY   ASSESSMENT AND PLAN   Principal Problem:   *METABOLIC ENCEPHALOPATHY - likely secondary to underlying HIV dementia as there are no obvious sources of infectious process, MRI negative LP attempted x3 per IR under fluoroscopy: unsuccessful Mental status improving  doubt hepatic encephalopathy: ammonia level was 8 on admission, Dced lactulose, repeat ammonia level Diet per speech - Psych consult appreciated for depression, capacity Add low dose risperidone at bedtime for agitation  Active Problems:  ACUTE RESPIRATORY FAILURE: s/p VRDF for airway protection - stable at this time; saturating 98% on 2 L nasal canula - as per family patient is DNI ONLY  ACUTE KIDNEY INJURY - related to dehydration, HIV, other meds  - resolved - CT/USG with Mild L hydronephrosis, Gu following, renal function normal now, Urology recommends FU CT   HYPERNATREMIA, HYPOKALEMIA, METABOLIC ACIDOSIS Change IVF back to 1/2 NS with Kcl  ABNORMAL LIVER FUNCTION TESTS - perhaps secondary to shock liver, on admission  procalcitonin level was elevated and concern for possible sepsis;  medications, ingestions, autoimmune. Trending down, CT/Usg without evidence of obstruction, CMP in am  INFECTIOUS  - No clear infectious source. HIV+. CD4 = 30; Holding HAART due to hepatitis. Patient will need MAC prophylaxis once hepatitis resolves.  - patietn remains afebrile - blood cultures to date are negative - ID is following   Updated brother yesterday   DNI only as an advance directive.  PT/OT consult  Subjective: No events overnight, expect for some agitation  Objective:  Vital signs in last 24 hours:   10/04/11 0627 10/04/11 1800 10/04/11 2200 10/05/11 0600  BP: 117/69 126/80 147/74 157/68  Pulse: 95 95 90 80  Temp: 98.7 F (37.1 C) 97.8 F (36.6 C) 97.6 F (36.4 C) 97.8 F (36.6 C)  TempSrc: Axillary Oral Oral Oral  Resp: 18 18 19 19   SpO2: 95% 94% 95% 96%    Intake/Output from previous day:   Gross per 24 hour  Intake   1410 ml  Output   3950 ml  Net  -2540 ml    Physical Exam: General: Alert, awake, oriented x2 (time and person), in no acute distress. HEENT: No bruits, no goiter. Dry mucous membranes, no scleral icterus, no conjunctival pallor. Heart: Regular rate and rhythm, S1/S2 +, no murmurs, rubs, gallops. Lungs: Clear to auscultation bilaterally. No wheezing, no rhonchi, no rales.  Abdomen: Soft, nontender, nondistended, positive bowel sounds. Extremities: No clubbing or cyanosis, no pitting edema,  positive pedal pulses. Neuro: Grossly nonfocal.  Lab Results:    Lab 10/03/11 0458 10/02/11 0500 10/01/11 0529 09/30/11 1604 09/30/11 0441  WBC 6.2 12.2* 12.8* 10.0 7.1  HGB 12.5* 14.5 14.1 15.2 15.8  HCT  35.1* 40.8 40.5 42.9 45.2  PLT 55* clumps 120* 172 114*  MCV 85.0 84.3 86.4 85.1 85.6    Lab 10/04/11 0500 10/03/11 0458 10/02/11 0500 10/01/11 0529 09/30/11 1858  NA 152* 145 139 134* 132*  K 3.2* 3.8 4.4 3.4* 3.8  CL 120* 113* 107 104 105  CO2 23 21 18* 18* 16*  GLUCOSE 119* 131* 105* 206* 172*  BUN 62* 76* 62* 26* 17    CREATININE 1.67* 2.40* 2.45* 1.15 0.82  CALCIUM 8.2* 7.7* 7.8* 7.1* 7.9*   Lab 09/30/11 0124 09/29/11 1756 09/29/11 1055  CKMB 16.7* 14.3* --  TROPONINI <0.30 <0.30 <0.30    CULTURE, BLOOD (ROUTINE X 2)     Status: Normal   09/29/11 10:59 AM   Specimen Description BLOOD    Final    Culture NO GROWTH 5 DAYS   Final    Report Status 10/04/2011 FINAL   Final   CULTURE, BLOOD (ROUTINE X 2)     Status: Normal   09/29/11 11:07 AM   Specimen Description BLOOD RIGHT   Final    Culture NO GROWTH 5 DAYS   Final    Report Status 10/04/2011 FINAL   Final    MRSA by PCR NEGATIVE  NEGATIVE  Final     Studies/Results: US Renal Port 10/03/2011  IMPRESSION:  1.  Increased echogenicity of both kidneys consistent with medical renal disease. 2.  Left-sided hydronephrosis. 3.  Cholelithiasis. 4.  Splenomegaly. 5.  Small amount of free fluid.      Medications: Scheduled Meds:   . insulin aspart  0-15 Units Subcutaneous Q4H  . lactulose  300 mL Rectal BID  . levothyroxine  56 mcg Intravenous Daily  . pantoprazole (PROTONIX) IV  40 mg Intravenous Q12H  . potassium chloride  10 mEq Intravenous Q1 Hr x 3     LOS: 8 days   Reichen Hutzler 10/07/2011, 8:07 PM  [facility] Pager: [phone]

## 2011-10-08 LAB — CBC
Platelets: 45 10*3/uL — ABNORMAL LOW (ref 150–400)
RDW: 15.1 % (ref 11.5–15.5)
WBC: 4.2 10*3/uL (ref 4.0–10.5)

## 2011-10-08 LAB — COMPREHENSIVE METABOLIC PANEL
ALT: 302 U/L — ABNORMAL HIGH (ref 0–53)
AST: 40 U/L — ABNORMAL HIGH (ref 0–37)
Albumin: 2.4 g/dL — ABNORMAL LOW (ref 3.5–5.2)
Alkaline Phosphatase: 145 U/L — ABNORMAL HIGH (ref 39–117)
Chloride: 105 mEq/L (ref 96–112)
Creatinine, Ser: 0.86 mg/dL (ref 0.50–1.35)
Potassium: 3.2 mEq/L — ABNORMAL LOW (ref 3.5–5.1)
Sodium: 141 mEq/L (ref 135–145)
Total Bilirubin: 1.4 mg/dL — ABNORMAL HIGH (ref 0.3–1.2)

## 2011-10-08 LAB — GLUCOSE, CAPILLARY
Glucose-Capillary: 101 mg/dL — ABNORMAL HIGH (ref 70–99)
Glucose-Capillary: 104 mg/dL — ABNORMAL HIGH (ref 70–99)

## 2011-10-08 MED ORDER — RISPERIDONE 0.5 MG PO TABS: 0.2500 mg | ORAL_TABLET | Freq: Every day | ORAL | Status: AC

## 2011-10-08 MED ORDER — POLYETHYLENE GLYCOL 3350 17 G PO PACK: 17.0000 g | PACK | Freq: Every day | ORAL | Status: DC | PRN

## 2011-10-08 MED ORDER — ZOLPIDEM TARTRATE 10 MG PO TABS: 5.0000 mg | ORAL_TABLET | Freq: Every evening | ORAL | Status: DC | PRN

## 2011-10-08 MED ORDER — PANTOPRAZOLE SODIUM 40 MG PO TBEC: 40.0000 mg | DELAYED_RELEASE_TABLET | Freq: Every day | ORAL | Status: DC

## 2011-10-08 NOTE — Discharge Summary (Addendum)
Physician Discharge Summary  Patient ID: Troy Holden MRN: [id number] DOB/AGE: 1951-05-06 61 y.o.  Admit date: 09/29/2011 Discharge date: 10/08/2011  Primary Care Physician: Dr. Vanetta Mulders Oncologist: Dr. Benita Gutter at [facility] Infectious disease: Dr. Justice Rocher at [facility]   Discharge Diagnoses:   1. Toxic metabolic encephalopathy 2. status post ventilator dependent respiratory failure 3. sepsis syndrome resolved 4. HIV/AIDS 5. diffuse large B-cell lymphoma 6. history of CAD 7. HIV associated dementia 8. shock liver resolving 9. acute renal failure resolved 10. Hypothyroidism 11. Hyponatremia resolved 12. Hypokalemia  Medication List  As of 10/08/2011 12:16 PM   STOP taking these medications         atovaquone 750 MG/5ML suspension      emtricitabine-tenofovir 200-300 MG per tablet      gabapentin 300 MG capsule      isosorbide mononitrate 30 MG 24 hr tablet      OLANZapine 2.5 MG tablet      prochlorperazine 10 MG tablet      raltegravir 400 MG tablet      thiamine 100 MG tablet      traZODone 50 MG tablet      triamcinolone cream 0.1 %      valACYclovir 500 MG tablet         TAKE these medications         acetaminophen 650 MG CR tablet   Commonly known as: TYLENOL   Take 650 mg by mouth every 6 (six) hours as needed.      clopidogrel 75 MG tablet   Commonly known as: PLAVIX   Take 75 mg by mouth daily.      docusate sodium 100 MG capsule   Commonly known as: COLACE   Take 100 mg by mouth 2 (two) times daily.      famotidine 20 MG tablet   Commonly known as: PEPCID   Take 20 mg by mouth 2 (two) times daily.      fluconazole 200 MG tablet   Commonly known as: DIFLUCAN   Take 200 mg by mouth daily.      levothyroxine 112 MCG tablet   Commonly known as: SYNTHROID, LEVOTHROID   Take 112 mcg by mouth daily.      magnesium oxide 400 MG tablet   Commonly known as: MAG-OX   Take 400 mg by mouth 3 (three) times daily.       metoprolol succinate 25 MG 24 hr tablet   Commonly known as: TOPROL-XL   Take 12.5 mg by mouth daily.      pantoprazole 40 MG tablet   Commonly known as: PROTONIX   Take 1 tablet (40 mg total) by mouth daily at 12 noon.      polyethylene glycol packet   Commonly known as: MIRALAX / GLYCOLAX   Take 17 g by mouth daily as needed.      pravastatin 40 MG tablet   Commonly known as: PRAVACHOL   Take 40 mg by mouth daily.      risperiDONE 0.5 MG tablet   Commonly known as: RISPERDAL   Take 0.5 tablets (0.25 mg total) by mouth at bedtime.      senna-docusate 8.6-50 MG per tablet   Commonly known as: Senokot-S   Take 2 tablets by mouth daily as needed for constipation.      zolpidem 10 MG tablet   Commonly known as: AMBIEN   Take 0.5 tablets (5 mg total) by mouth at bedtime as needed.  To help sleep         ASK your doctor about these medications         nitroGLYCERIN 0.4 MG SL tablet   Commonly known as: NITROSTAT   Place 0.4 mg under the tongue every 5 (five) minutes as needed.           Disposition and Follow-up:  Dr.Peppercorn, ID at [facility] in 2 weeks  Consults:  Pulmonary critical care medicine Infectious disease Dr. Merceda Elks  Significant Diagnostic Studies:  Mr [name] Contrast 10-13-2011  .  IMPRESSION: No acute infarct.  Atrophy.  Nonspecific white matter type changes as detailed above.  Paranasal sinus opacification.  Original Report Authenticated By: Fuller Canada, M.D.   Dg Chest Port 1 View 09/30/2011.  IMPRESSION: Satisfactory endotracheal tube position. Questionable hazy bilateral perihilar infiltrates.  Original Report Authenticated By: Lollie Marrow, M.D.   Dg Chest Portable 1 View 10-13-11   IMPRESSION: Borderline heart size.  No active disease.  Original Report Authenticated By: Cyndie Chime, M.D.   Dg Lumbar Puncture Fluoro Guide 10-13-2011  *RADIOLOGY REPORT*  Clinical Data: HIV positive with altered mental status.  FLUOROSCOPIC  GUIDED LUMBAR PUNCTURE.  Technique: Informed consent could not be obtained secondary to patient clinical status.  By the the emergency department clinical staff, the procedure was deemed medically necessary.  A complete time-out could not be performed.  The patient's name and wrist band were verified.  Initially, the L3-L4 level was localized.  Skin was prepped and draped in a standard sterile fashion.  Skin and subcutaneous tissues were numbed with 1% lidocaine.  Despite optimal needle position, CSF return could not be obtained.  After three attempts at needle placement, attention was turned to the L2-L3 level.  Skin was prepped with 1% lidocaine.  The needle was again placed optimally.  Despite this, CSF return could not be obtained.  Fluoroscopic time: 4 minutes  Comparison: MRI of same date.  Findings: Not applicable  IMPRESSION: Unsuccessful attempt at fluoroscopic guided lumbar puncture, as detailed above.  Original Report Authenticated By: Consuello Bossier, M.D.    Brief H and P:Troy Holden is an 61 y.o. male with a PMH of HIV who resides at [facility], and who was last seen normal last night. He is normally ambulatory and conversant. EMS was called by ALF staff due to unresponsiveness. The patient arrived in the ER soiled, with emesis on his shirt. The patient opened his eyes, but was non-verbal. The patient was evaluated by the EDP. An MRI of the brain was unrevealing. He was given Narcan with no improvement of his mental status. Of note, the patient has an elevated pro-calcitonin level, and there are some concerns for sepsis, although he is hemodynamically stable. An LP has been ordered, but has not yet been done. The patient was unable to provide any history.  Hospital Course:  1. Toxic metabolic encephalopathy:likely secondary to underlying HIV dementia as there are no obvious sources of infectious process, all of his cultures and other infectious workup was negative. MRI negative  LP  attempted x3 per IR under fluoroscopy: unsuccessful  His mental status has continued to improve throughout his hospital course and has improved back to his baseline. He does have HIV-associated dementia with progressive cognitive decline over the last year as noted by his family He was seen and followed by infectious disease throughout his hospital course. In addition due to occasional episodes of agitation he was  seen by psychiatry in consultation for agitation, and recommended low-dose risperidone which the patient has tolerated well for the last couple of days. 2. suspected sepsis syndrome: Based on hypotension elevated lactic acid and will calcitonin levels on initial evaluation. He also was intubated for airway protection , initially managed by critical care medicine subsequently transitioned to the medical service few days ago.  All his cultures have been negative and empiric antibiotics were subsequently discontinued. However he did have shock liver and renal failure secondary to hypotension from suspected sepsis syndrome. All of which have improved. 3. shock liver: Did have significantly abnormal AST and ALT in the several thousand range, which has subsequently continued to improve however not normalized yet. His peak AST was 2400 and peak ALT was 6500, currently improved to an ALT of 302 and AST of 40. Due to abnormal liver function tests which are currently resolving secondary to shock liver his HIV medications as well as prophylactic medications are on hold. His HAART therapy could be resumed in one to 2 weeks by his infectious disease doctor Dr. Lurlean Leyden at [facility] once his liver function normalizes. 4. acute renal failure: Secondary to ATN from hypotension resolved, creatinine and attempt discharge is 0.8. 5. HIV/AIDS: His CD4 count here is 30 and his viral load was undetectable which suggested compliance with medications, currently holding his anti-retroviral therapy due to hepatitis,  also holding his atovaquone for prophylaxis. He is advised to followup with Dr. Lurlean Leyden in 1-2 weeks, then he'll need to have his LFTs checked and could potentially resume his HAART therapy. He was previously on Truvada,raltigravir along with atovaquone and valacyclovir for prophylaxis. 6. history of diffuse large B-cell lymphoma: Under treatment by Dr. Benita Gutter [facility] he status post 5 cycles of R-EPOCH chemotherapy, he is advised to follow up with Dr. Malen Gauze in 2-3 weeks. 7. HIV associated dementia: Currently stable and at baseline. CODE STATUS: DO NOT INTUBATE only  Time spent on Discharge:  Signed: Madoc Holquin [facility]  10/08/2011, 12:16 PM

## 2011-10-08 NOTE — Consult Note (Signed)
Reason for Consult: depression and capacity evaluation   Troy Holden is an 61 y.o. male.  HPI:  Troy Holden is an 61 y.o. male with a PMH of HIV who resides at [facility]. Per chart he is normally ambulatory and conversant. EMS was called by ALF staff due to unresponsiveness. The patient arrived in the ER soiled, with emesis on his shirt. Marland Kitchen An MRI of the brain was unrevealing.  The patient is unable to provide any history when he was brought to ED.   Was seen today. Less disorganized and able to give much relevant history. Not sure why he is in the hospital. Not able to give the details of any depressive symptoms. Thinks he is getting better now. Good sleep and appetite. No new acute issues per pt.   Past Medical History  Diagnosis Date  . Heart disease   . Cancer     lymphoma  . HIV (human immunodeficiency virus infection)   . Anemia   . Anxiety   . Blood transfusion   . Cataract   . Depression   . GERD (gastroesophageal reflux disease)   . Thyroid disease     low thyroid  . 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 HIV (human immunodeficiency virus infection) 04/20/2011  . Dementia 04/20/2011  . CAD (coronary artery disease) 04/20/2011  . Hypothyroidism 04/20/2011  . Dyslipidemia 04/20/2011    Past Surgical History  Procedure Date  . Cardiac surgery   . Abdominal surgery     Family History  Problem Relation Age of Onset  . Colon cancer Mother   . Colon cancer Brother     Social History:  reports that he has been smoking Cigarettes.  He has a 10 pack-year smoking history. He does not have any smokeless tobacco history on file. He reports that he drinks alcohol. He reports that he does not use illicit drugs.  Allergies:  Allergies  Allergen Reactions  . Xylocaine Rash    Patient had to be rushed to hospital from dentist office after receiving xylocaine  . Trazodone And Nefazodone Other (See  Comments)    Nightmares, hangover like feeling for 2-3 days    Medications: I have reviewed the patient's current medications.  Results for orders placed during the hospital encounter of 09/29/11 (from the past 48 hour(s))  GLUCOSE, CAPILLARY     Status: Abnormal   Collection Time   10/06/11  3:44 AM      Component Value Range Comment   Glucose-Capillary 135 (*) 70 - 99 (mg/dL)    Comment 1 Documented in Chart      Comment 2 Notify RN     AMMONIA     Status: Normal   Collection Time   10/06/11  8:52 AM      Component Value Range Comment   Ammonia 52  11 - 60 (umol/L)   GLUCOSE, CAPILLARY     Status: Abnormal   Collection Time   10/06/11 11:44 AM      Component Value Range Comment   Glucose-Capillary 143 (*) 70 - 99 (mg/dL)   GLUCOSE, CAPILLARY     Status: Abnormal   Collection Time   10/06/11  4:29 PM      Component Value Range Comment   Glucose-Capillary 107 (*) 70 - 99 (mg/dL)   GLUCOSE, CAPILLARY     Status: Abnormal   Collection Time   10/06/11  8:18 PM  Component Value Range Comment   Glucose-Capillary 151 (*) 70 - 99 (mg/dL)   GLUCOSE, CAPILLARY     Status: Abnormal   Collection Time   10/07/11  5:46 AM      Component Value Range Comment   Glucose-Capillary 110 (*) 70 - 99 (mg/dL)   CBC     Status: Abnormal   Collection Time   10/07/11  5:50 AM      Component Value Range Comment   WBC 4.2  4.0 - 10.5 (K/uL)    RBC 3.65 (*) 4.22 - 5.81 (MIL/uL)    Hemoglobin 11.0 (*) 13.0 - 17.0 (g/dL)    HCT 40.9 (*) 81.1 - 52.0 (%)    MCV 86.6  78.0 - 100.0 (fL)    MCH 30.1  26.0 - 34.0 (pg)    MCHC 34.8  30.0 - 36.0 (g/dL)    RDW 91.4  78.2 - 95.6 (%)    Platelets 40 (*) 150 - 400 (K/uL) CONSISTENT WITH PREVIOUS RESULT  COMPREHENSIVE METABOLIC PANEL     Status: Abnormal   Collection Time   10/07/11  5:50 AM      Component Value Range Comment   Sodium 139  135 - 145 (mEq/L)    Potassium 2.1 (*) 3.5 - 5.1 (mEq/L)    Chloride 106  96 - 112 (mEq/L)    CO2 25  19 - 32 (mEq/L)     Glucose, Bld 111 (*) 70 - 99 (mg/dL)    BUN 13  6 - 23 (mg/dL)    Creatinine, Ser 2.13  0.50 - 1.35 (mg/dL)    Calcium 7.4 (*) 8.4 - 10.5 (mg/dL)    Total Protein 5.1 (*) 6.0 - 8.3 (g/dL)    Albumin 2.5 (*) 3.5 - 5.2 (g/dL)    AST 33  0 - 37 (U/L)    ALT 394 (*) 0 - 53 (U/L)    Alkaline Phosphatase 123 (*) 39 - 117 (U/L)    Total Bilirubin 1.6 (*) 0.3 - 1.2 (mg/dL)    GFR calc non Af Amer >90  >90 (mL/min)    GFR calc Af Amer >90  >90 (mL/min)   GLUCOSE, CAPILLARY     Status: Abnormal   Collection Time   10/07/11 11:31 AM      Component Value Range Comment   Glucose-Capillary 124 (*) 70 - 99 (mg/dL)   GLUCOSE, CAPILLARY     Status: Abnormal   Collection Time   10/07/11  4:47 PM      Component Value Range Comment   Glucose-Capillary 116 (*) 70 - 99 (mg/dL)     No results found.  Review of Systems  HENT: Negative.    Blood pressure 143/96, pulse 105, temperature 98 F (36.7 C), temperature source Oral, resp. rate 20, height 6\' 2"  (1.88 m), weight 82.9 kg (182 lb 12.2 oz), SpO2 92.00%. Physical Exam    MSE:  alert and oriented to only person and place. On bed. No signs of tics or EPS. Behavior is non-cooperative.Speech is of rapid rate, volume. Mood is irritable . Affect flat. Thought process is non- coherent. Thought content includes possible delusions, Associations are intact. Immediate, recent and remote memory are poor. Approximate intelligence is below average. Insight and judgment are imapird  Assessment  Axis I: psychotic d/o nos, cognitive d/o nos Axis II: Deferred Axis III: See medical hx Axis [id number] social support Axis V: 25   Rec:  - Continue low does of risperidone 0.5 mg QHS for psychosis and  agiatation  - will continue to to follow on 3/16   Wonda Cerise 10/08/2011, 1:51 AM

## 2011-10-08 NOTE — Progress Notes (Signed)
Pt d/c to [facility] today. Pt and family agreeable to this plan. Packet placed in [facility]. [facility] called for transport. CSW signing off.  Baxter Flattery, MSW [phone]

## 2011-10-08 NOTE — Progress Notes (Signed)
Physical Therapy Treatment Patient Details Name: Troy Holden MRN: [id number] DOB: 12-07-1950 Today's Date: 10/08/2011  PT Assessment/Plan  PT - Assessment/Plan Comments on Treatment Session: Patient with improved participation today, oriented to self and place only. Focus of session on activity tolerance, problem solving, and emergent awareness during mobility for increased safety and functional independence, and decreased risk of fall. PT Plan: Discharge plan remains appropriate PT Goals  Acute Rehab PT Goals PT Goal: Supine/Side to Sit - Progress: Progressing toward goal PT Goal: Sit at Edge Of Bed - Progress: Progressing toward goal PT Goal: Sit to Supine/Side - Progress: Progressing toward goal PT Goal: Sit to Stand - Progress: Met PT Goal: Stand to Sit - Progress: Met PT Transfer Goal: Bed to Chair/Chair to Bed - Progress: Met PT Goal: Stand - Progress: Progressing toward goal PT Goal: Ambulate - Progress: Met  PT Treatment Precautions/Restrictions  Precautions Precautions: Fall Required Braces or Orthoses: No Restrictions Weight Bearing Restrictions: No Mobility (including Balance) Bed Mobility Bed Mobility: No Transfers Transfers: Yes Sit to Stand: 4: Min assist;From chair/3-in-1;From toilet;With upper extremity assist Stand to Sit: 4: Min assist;With upper extremity assist;To bed;To toilet Stand Pivot Transfers: 4: Min assist (with RW) Stand Pivot Transfer Details (indicate cue type and reason): focus on RW position, hand placement, and anterior weight shift to increase safety and functional independence. Ambulation/Gait Ambulation/Gait: Yes Ambulation/Gait Assistance: 4: Min assist Ambulation/Gait Assistance Details (indicate cue type and reason): focus on increased BOS and RW position, especially when turning, for increased stability and decreased risk of fall, verbal cues for upright posture and environmental scanning  Ambulation Distance (Feet): 153 Feet Assistive  device: Rolling walker Gait Pattern: Step-through pattern;Shuffle;Trunk flexed Gait velocity: 0.5 feet/second = increased risk of fall Stairs: No Wheelchair Mobility Wheelchair Mobility: No  Posture/Postural Control Posture/Postural Control: No significant limitations Balance Balance Assessed: Yes Dynamic Sitting Balance Dynamic Sitting - Balance Support: Feet supported;No upper extremity supported Dynamic Sitting - Level of Assistance: 4: Min assist (tends to lose balance laterally and to left) Dynamic Sitting - Balance Activities: Forward lean/weight shifting;Reaching for objects (focus on righting reactions and activity tolerance)   End of Session PT - End of Session Equipment Utilized During Treatment: Gait belt;Other (comment) (RW) Activity Tolerance: Patient tolerated treatment well Patient left: in chair;with call bell in reach General Behavior During Session: Flat affect Cognition: Impaired Cognitive Impairment: decreased safety awareness and problem solving requiring mod cues of PT emergently during mobility, increased processing time  Romeo Rabon 10/08/2011, 10:24 AM

## 2011-10-08 NOTE — Progress Notes (Addendum)
Speech Language/Pathology Speech Pathology: Dysphagia Treatment Note   Subjective:  Pt. Sitting in recliner with friends present  Lung Sounds:  diminished Temperature: 98.1  Objective:  Pt. States he is discharging to rehab center today.  Observed with nectar juice.  Pt. Recalled chin tuck strategy independently and needed min verbal cues to tuck chin lower to chest during swallow.  One brief episode of wet vocal quality indicative of possible po's in laryngeal vestibule followed by spontaneous throat clear which cleared vocal quality.  Pt. Reported having esophagus stretched one year ago.  Appeared to have tight UES during MBS.  Rec follow up for need for esophageal dilation  Clinical Impression:  Appears to tolerate Dys 3 diet and nectar thick liquids with occasional/subtle s/s aspiration.  He is afebrile and lungs are stable    Recommendations:  Continue Dys 3 diet and nectar thick liquids.  Recommend pt. Return for outpatient MBS in 2 weeks for possibility of upgrade to thin liquids.  Follow up with MD for esophageal function and need for repeat dilitation.   Pain:   none Intervention Required:   No   Goals:  Goal met  Royce Macadamia M.Ed [employer] [phone]  10/08/2011

## 2011-10-08 NOTE — Progress Notes (Signed)
   CARE MANAGEMENT NOTE 10/08/2011  Patient:  Troy Holden, Troy Holden   Account Number:  [id number]  Date Initiated:  09/30/2011  Documentation initiated by:  [facility]  Subjective/Objective Assessment:   Admitted to [facility] with AMS -  progressed to resp failure - intubated and tx to [facility].  From [facility].     Action/Plan:   Anticipated DC Date:  10/07/2011   Anticipated DC Plan:  SKILLED NURSING FACILITY  In-house referral  Clinical Social Worker      DC Planning Services  CM consult      Choice offered to / List presented to:             Status of service:  Completed, signed off Medicare Important Message given?   (If response is "NO", the following Medicare IM given date fields will be blank) Date Medicare IM given:   Date Additional Medicare IM given:    Discharge Disposition:  SKILLED NURSING FACILITY  Per UR Regulation:  Reviewed for med. necessity/level of care/duration of stay  If discussed at Long Length of Stay Meetings, dates discussed:    Comments:  10/08/11 Onnie Boer, RN, BSN 1655 PT IS DC'D TO [facility].

## 2011-10-12 LAB — GLUCOSE, CAPILLARY
Glucose-Capillary: 118 mg/dL — ABNORMAL HIGH (ref 70–99)
Glucose-Capillary: 93 mg/dL (ref 70–99)

## 2013-01-02 ENCOUNTER — Encounter (HOSPITAL_COMMUNITY): Payer: Self-pay | Admitting: Family Medicine

## 2013-01-02 ENCOUNTER — Emergency Department (HOSPITAL_COMMUNITY)
Admission: EM | Admit: 2013-01-02 | Discharge: 2013-01-03 | Disposition: A | Discharge status: Home / Self Care | Payer: Medicaid Other | Attending: Emergency Medicine | Admitting: Emergency Medicine

## 2013-01-02 DIAGNOSIS — L03317 Cellulitis of buttock: Secondary | ICD-10-CM

## 2013-01-02 DIAGNOSIS — I2510 Atherosclerotic heart disease of native coronary artery without angina pectoris: Secondary | ICD-10-CM | POA: Insufficient documentation

## 2013-01-02 DIAGNOSIS — E871 Hypo-osmolality and hyponatremia: Secondary | ICD-10-CM | POA: Insufficient documentation

## 2013-01-02 DIAGNOSIS — Z87898 Personal history of other specified conditions: Secondary | ICD-10-CM | POA: Insufficient documentation

## 2013-01-02 DIAGNOSIS — E039 Hypothyroidism, unspecified: Secondary | ICD-10-CM | POA: Insufficient documentation

## 2013-01-02 DIAGNOSIS — F0390 Unspecified dementia without behavioral disturbance: Secondary | ICD-10-CM | POA: Insufficient documentation

## 2013-01-02 DIAGNOSIS — Z79899 Other long term (current) drug therapy: Secondary | ICD-10-CM | POA: Insufficient documentation

## 2013-01-02 DIAGNOSIS — D649 Anemia, unspecified: Secondary | ICD-10-CM | POA: Insufficient documentation

## 2013-01-02 DIAGNOSIS — L0231 Cutaneous abscess of buttock: Secondary | ICD-10-CM | POA: Insufficient documentation

## 2013-01-02 DIAGNOSIS — F17200 Nicotine dependence, unspecified, uncomplicated: Secondary | ICD-10-CM | POA: Insufficient documentation

## 2013-01-02 DIAGNOSIS — Z7902 Long term (current) use of antithrombotics/antiplatelets: Secondary | ICD-10-CM | POA: Insufficient documentation

## 2013-01-02 DIAGNOSIS — Z21 Asymptomatic human immunodeficiency virus [HIV] infection status: Secondary | ICD-10-CM | POA: Insufficient documentation

## 2013-01-02 DIAGNOSIS — I10 Essential (primary) hypertension: Secondary | ICD-10-CM | POA: Insufficient documentation

## 2013-01-02 DIAGNOSIS — K219 Gastro-esophageal reflux disease without esophagitis: Secondary | ICD-10-CM | POA: Insufficient documentation

## 2013-01-02 HISTORY — DX: Essential (primary) hypertension: I10

## 2013-01-02 MED ORDER — SODIUM CHLORIDE 0.9 % IV SOLN
1000.0000 mL | INTRAVENOUS | Status: DC
  Administered 2013-01-03: 1000 mL via INTRAVENOUS

## 2013-01-02 MED ORDER — SODIUM CHLORIDE 0.9 % IV SOLN
1000.0000 mL | Freq: Once | INTRAVENOUS | Status: AC
  Administered 2013-01-02: 1000 mL via INTRAVENOUS

## 2013-01-02 NOTE — ED Notes (Signed)
Patient states that rectal bleeding started around 730/800pm. States that he called the on-call GI doctor at [facility] and was told to come here for evaluation. Describes bleeding as dark red and had an episode of bleeding that saturated his pants. Reports that he feels dizzy.

## 2013-01-02 NOTE — ED Provider Notes (Signed)
History     CSN: [id number]  Arrival date & time 01/02/13  2229   First MD Initiated Contact with Patient 01/02/13 2313      Chief Complaint  Patient presents with  . Rectal Bleeding    (Consider location/radiation/quality/duration/timing/severity/associated sxs/prior treatment) Patient is a 62 y.o. male presenting with hematochezia. The history is provided by the patient.  Rectal Bleeding He had a colonoscopy today because of a positive PET scan and he had a rectal mass which was biopsied. He had been on Plavix but was taken off of it for 10 days prior to the colonoscopy and has not taken any Plavix today. When he got home, he started feeling nauseous and lightheaded and his friend noted that his pants were saturated with blood. He he called the gastroenterologist recommended that he come to the ED. Also, it he is noted that there was a bump in his left buttock for the last 6 days which has gotten bigger. He is also noted 2 more bouts of of-1 in the right pubic area and one in the right thigh. The bump on his by Dr. had caused him sufficient pain to where he had to walk with a cane. It was much more painful when he tried to bear weight.   Past Medical History  Diagnosis Date  . Heart disease   . Cancer     lymphoma  . HIV (human immunodeficiency virus infection)   . Anemia   . Anxiety   . Blood transfusion   . Cataract   . Depression   . GERD (gastroesophageal reflux disease)   . Thyroid disease     low thyroid  . 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 HIV (human immunodeficiency virus infection) 04/20/2011  . Dementia 04/20/2011  . CAD (coronary artery disease) 04/20/2011  . Hypothyroidism 04/20/2011  . Dyslipidemia 04/20/2011  . Hypertension     Past Surgical History  Procedure Laterality Date  . Cardiac surgery    . Abdominal surgery    . Colonoscopy      Family History  Problem Relation Age of Onset  .  Colon cancer Mother   . Colon cancer Brother     History  Substance Use Topics  . Smoking status: Current Some Day Smoker -- 0.25 packs/day for 10 years    Types: Cigarettes    Last Attempt to Quit: 03/30/2011  . Smokeless tobacco: Not on file  . Alcohol Use: Yes     Comment: rarely      Review of Systems  Gastrointestinal: Positive for hematochezia.  All other systems reviewed and are negative.    Allergies  Lidocaine hcl and Trazodone and nefazodone  Home Medications   Current Outpatient Rx  Name  Route  Sig  Dispense  Refill  . acetaminophen (TYLENOL) 650 MG CR tablet   Oral   Take 650 mg by mouth every 6 (six) hours as needed.           . clopidogrel (PLAVIX) 75 MG tablet   Oral   Take 75 mg by mouth daily.           Marland Kitchen docusate sodium (COLACE) 100 MG capsule   Oral   Take 100 mg by mouth 2 (two) times daily.           . famotidine (PEPCID) 20 MG tablet   Oral   Take 20 mg by mouth 2 (two) times daily.           Marland Kitchen  fluconazole (DIFLUCAN) 200 MG tablet   Oral   Take 200 mg by mouth daily.           Marland Kitchen levothyroxine (SYNTHROID, LEVOTHROID) 112 MCG tablet   Oral   Take 112 mcg by mouth daily.           . magnesium oxide (MAG-OX) 400 MG tablet   Oral   Take 400 mg by mouth 3 (three) times daily.           . metoprolol succinate (TOPROL-XL) 25 MG 24 hr tablet   Oral   Take 12.5 mg by mouth daily.           . nitroGLYCERIN (NITROSTAT) 0.4 MG SL tablet   Sublingual   Place 0.4 mg under the tongue every 5 (five) minutes as needed.           . polyethylene glycol (MIRALAX / GLYCOLAX) packet   Oral   Take 17 g by mouth daily as needed.   14 each   0   . pravastatin (PRAVACHOL) 40 MG tablet   Oral   Take 40 mg by mouth daily.           Marland Kitchen zolpidem (AMBIEN) 10 MG tablet   Oral   Take 0.5 tablets (5 mg total) by mouth at bedtime as needed. To help sleep   30 tablet   0     BP 106/58  Pulse 84  Temp(Src) 99.7 F (37.6 C)  (Oral)  Resp 18  Ht 6\' 2"  (1.88 m)  Wt 171 lb (77.565 kg)  BMI 21.95 kg/m2  SpO2 96%  Physical Exam  Nursing note and vitals reviewed.  62 year old male, resting comfortably and in no acute distress. Vital signs are normal. Oxygen saturation is 96%, which is normal. Head is normocephalic and atraumatic. PERRLA, EOMI. Oropharynx is clear. Neck is nontender and supple without adenopathy or JVD. Back is nontender and there is no CVA tenderness. Lungs are clear without rales, wheezes, or rhonchi. Chest is nontender. Heart has regular rate and rhythm without murmur. Abdomen is soft, flat, nontender without masses or hepatosplenomegaly and peristalsis is normoactive. Rectal: Normal sphincter tone, no gross blood. The left but not has a large area which is erythematous and indurated with some seropurulent drainage through the central portion consistent with a gluteal abscess. This seems most likely to be the source of his bleeding. Extremities have no cyanosis or edema, full range of motion is present. Skin is warm and dry. In addition to the gluteal abscess, and there is an erythematous lesion in the medial aspect of the proximal right thigh which is consistent with an abscess which may have spontaneously drained. A second lesion is somewhat larger in the right pubic area and it is not indurated or fluctuant and appears to drain spontaneously. These are consistent with the MRSA abscesses which have drained spontaneously. Neurologic: Mental status is normal, cranial nerves are intact, there are no motor or sensory deficits.  ED Course  Procedures (including critical care time) INCISION AND DRAINAGE Performed by: [email or web address] Consent: Verbal consent obtained. Risks and benefits: risks, benefits and alternatives were discussed Type: abscess  Body area: left buttock  Anesthesia: local infiltration  Incision was made with a scalpel.  Local anesthetic: Diphenhydramine - used because he is  allergic to lidocaine   Anesthetic total: 8 ml  Complexity: complex Blunt dissection to break up loculations  Drainage: purulent  Drainage amount: moderate  Packing material: none  Patient tolerance:  Patient tolerated the procedure well with no immediate complications, but anesthesia was suboptimal.   Results for orders placed during the hospital encounter of 01/02/13  CBC WITH DIFFERENTIAL      Result Value Range   WBC 5.0  4.0 - 10.5 K/uL   RBC 3.69 (*) 4.22 - 5.81 MIL/uL   Hemoglobin 11.0 (*) 13.0 - 17.0 g/dL   HCT 21.3 (*) 08.6 - 57.8 %   MCV 89.2  78.0 - 100.0 fL   MCH 29.8  26.0 - 34.0 pg   MCHC 33.4  30.0 - 36.0 g/dL   RDW 46.9  62.9 - 52.8 %   Platelets 97 (*) 150 - 400 K/uL   Neutrophils Relative % 62  43 - 77 %   Lymphocytes Relative 23  12 - 46 %   Monocytes Relative 13 (*) 3 - 12 %   Eosinophils Relative 2  0 - 5 %   Basophils Relative 0  0 - 1 %   Neutro Abs 3.0  1.7 - 7.7 K/uL   Lymphs Abs 1.2  0.7 - 4.0 K/uL   Monocytes Absolute 0.7  0.1 - 1.0 K/uL   Eosinophils Absolute 0.1  0.0 - 0.7 K/uL   Basophils Absolute 0.0  0.0 - 0.1 K/uL   Smear Review MORPHOLOGY UNREMARKABLE    BASIC METABOLIC PANEL      Result Value Range   Sodium 133 (*) 135 - 145 mEq/L   Potassium 3.8  3.5 - 5.1 mEq/L   Chloride 99  96 - 112 mEq/L   CO2 27  19 - 32 mEq/L   Glucose, Bld 121 (*) 70 - 99 mg/dL   BUN 13  6 - 23 mg/dL   Creatinine, Ser 4.13  0.50 - 1.35 mg/dL   Calcium 8.6  8.4 - 24.4 mg/dL   GFR calc non Af Amer >90  >90 mL/min   GFR calc Af Amer >90  >90 mL/min  PROTIME-INR      Result Value Range   Prothrombin Time 13.4  11.6 - 15.2 seconds   INR 1.03  0.00 - 1.49  APTT      Result Value Range   aPTT 38 (*) 24 - 37 seconds  OCCULT BLOOD, POC DEVICE      Result Value Range   Fecal Occult Bld POSITIVE (*) NEGATIVE   CT report shows no focal fluid collection but changes consistent with cellulitis. Report is not crossing over at this time.   1. Abscess, gluteal,  left   2. Cellulitis, gluteal, left   3. Anemia   4. Hyponatremia       MDM  Bleeding which seems most likely to have come from gluteal abscess. Even if his stool is Hemoccult positive, that would be expected following biopsy of a mass. There is no gross blood in the rectum. Abscess will need incision and drainage. With additional lesions, he will need antibiotics. Orthostatic vital signs will be checked as well as hemoglobin level.  Orthostatic vital signs did not show significant drop in blood pressure, but less blood pressure was 87 systolic. This is only a drop of 9 mm of mercury. There is no change in pulse. Laboratory workup shows no significant change in hemoglobin over his baseline. I was concerned that had not completely drained the abscess. Given his HIV status, it was felt prudent to get a CT scan to evaluate if he were to need additional drainage. He because of inability to use lidocaine, anesthesia has been  suboptimal and if she needed additional drainage, it would likely need to be done under general anesthesia.   CT is more consistent with cellulitis. He clearly had a large amount of drainage from this area but it appears that the abscess cavity has been completely drained. He states that the pain is much better and his buttock. He does not appear toxic in any way. Orthostatic vital signs will be rechecked to make sure that he is not getting hypotensive and. He is given a dose of vancomycin in the ED and will be discharged with prescriptions for Bactrim and he will return in 2 days for recheck.      Dione Booze, MD 01/03/13 [phone]

## 2013-01-02 NOTE — ED Notes (Signed)
Per EMS, patient presents with c/o rectal bleeding. Patient s/p colonoscopy at [facility] today. Bleeding started this evening around 2100.

## 2013-01-03 ENCOUNTER — Encounter (HOSPITAL_COMMUNITY): Payer: Self-pay | Admitting: Emergency Medicine

## 2013-01-03 ENCOUNTER — Emergency Department (HOSPITAL_COMMUNITY): Payer: Medicaid Other

## 2013-01-03 LAB — CBC WITH DIFFERENTIAL/PLATELET
Basophils Absolute: 0 10*3/uL (ref 0.0–0.1)
Eosinophils Absolute: 0.1 10*3/uL (ref 0.0–0.7)
HCT: 32.9 % — ABNORMAL LOW (ref 39.0–52.0)
Lymphocytes Relative: 23 % (ref 12–46)
MCHC: 33.4 g/dL (ref 30.0–36.0)
Neutro Abs: 3 10*3/uL (ref 1.7–7.7)
Neutrophils Relative %: 62 % (ref 43–77)
RDW: 15.4 % (ref 11.5–15.5)

## 2013-01-03 LAB — BASIC METABOLIC PANEL
BUN: 13 mg/dL (ref 6–23)
Calcium: 8.6 mg/dL (ref 8.4–10.5)
GFR calc Af Amer: >90 mL/min (ref >90)
GFR calc non Af Amer: >90 mL/min (ref >90)
Potassium: 3.8 mEq/L (ref 3.5–5.1)

## 2013-01-03 LAB — APTT: aPTT: 38 seconds — ABNORMAL HIGH (ref 24–37)

## 2013-01-03 MED ORDER — VANCOMYCIN HCL IN DEXTROSE 1-5 GM/200ML-% IV SOLN
1000.0000 mg | Freq: Once | INTRAVENOUS | Status: AC
  Administered 2013-01-03: 1000 mg via INTRAVENOUS
  Filled 2013-01-03: qty 200

## 2013-01-03 MED ORDER — DIPHENHYDRAMINE HCL 50 MG/ML IJ SOLN
50.0000 mg | Freq: Once | INTRAMUSCULAR | Status: DC
  Filled 2013-01-03: qty 1

## 2013-01-03 MED ORDER — VANCOMYCIN HCL 10 G IV SOLR: 1000.0000 mg | Freq: Once | INTRAVENOUS | Status: DC

## 2013-01-03 MED ORDER — SULFAMETHOXAZOLE-TRIMETHOPRIM 800-160 MG PO TABS: 1.0000 | ORAL_TABLET | Freq: Two times a day (BID) | ORAL | Status: DC

## 2013-01-03 MED ORDER — IOHEXOL 300 MG/ML  SOLN
100.0000 mL | Freq: Once | INTRAMUSCULAR | Status: AC | PRN
  Administered 2013-01-03: 100 mL via INTRAVENOUS

## 2013-01-03 NOTE — ED Notes (Signed)
Pt escorted to discharge window. Verbalized understanding discharge instructions. In no acute distress.   

## 2013-01-03 NOTE — ED Notes (Signed)
Pt put up for d/c must wait for Vancomycin to finish.

## 2013-01-06 ENCOUNTER — Emergency Department (HOSPITAL_COMMUNITY)
Admission: EM | Admit: 2013-01-06 | Discharge: 2013-01-06 | Disposition: A | Discharge status: Home / Self Care | Payer: Medicare Other | Attending: Emergency Medicine | Admitting: Emergency Medicine

## 2013-01-06 ENCOUNTER — Encounter (HOSPITAL_COMMUNITY): Payer: Self-pay | Admitting: Emergency Medicine

## 2013-01-06 DIAGNOSIS — Z8619 Personal history of other infectious and parasitic diseases: Secondary | ICD-10-CM | POA: Insufficient documentation

## 2013-01-06 DIAGNOSIS — Z8679 Personal history of other diseases of the circulatory system: Secondary | ICD-10-CM | POA: Insufficient documentation

## 2013-01-06 DIAGNOSIS — Z21 Asymptomatic human immunodeficiency virus [HIV] infection status: Secondary | ICD-10-CM | POA: Insufficient documentation

## 2013-01-06 DIAGNOSIS — F17200 Nicotine dependence, unspecified, uncomplicated: Secondary | ICD-10-CM | POA: Insufficient documentation

## 2013-01-06 DIAGNOSIS — Z7902 Long term (current) use of antithrombotics/antiplatelets: Secondary | ICD-10-CM | POA: Insufficient documentation

## 2013-01-06 DIAGNOSIS — F0390 Unspecified dementia without behavioral disturbance: Secondary | ICD-10-CM | POA: Insufficient documentation

## 2013-01-06 DIAGNOSIS — F329 Major depressive disorder, single episode, unspecified: Secondary | ICD-10-CM | POA: Insufficient documentation

## 2013-01-06 DIAGNOSIS — E785 Hyperlipidemia, unspecified: Secondary | ICD-10-CM | POA: Insufficient documentation

## 2013-01-06 DIAGNOSIS — Z862 Personal history of diseases of the blood and blood-forming organs and certain disorders involving the immune mechanism: Secondary | ICD-10-CM | POA: Insufficient documentation

## 2013-01-06 DIAGNOSIS — R110 Nausea: Secondary | ICD-10-CM | POA: Insufficient documentation

## 2013-01-06 DIAGNOSIS — Z9889 Other specified postprocedural states: Secondary | ICD-10-CM | POA: Insufficient documentation

## 2013-01-06 DIAGNOSIS — Z87898 Personal history of other specified conditions: Secondary | ICD-10-CM | POA: Insufficient documentation

## 2013-01-06 DIAGNOSIS — Z79899 Other long term (current) drug therapy: Secondary | ICD-10-CM | POA: Insufficient documentation

## 2013-01-06 DIAGNOSIS — F411 Generalized anxiety disorder: Secondary | ICD-10-CM | POA: Insufficient documentation

## 2013-01-06 DIAGNOSIS — H269 Unspecified cataract: Secondary | ICD-10-CM | POA: Insufficient documentation

## 2013-01-06 DIAGNOSIS — L0231 Cutaneous abscess of buttock: Secondary | ICD-10-CM | POA: Insufficient documentation

## 2013-01-06 DIAGNOSIS — F3289 Other specified depressive episodes: Secondary | ICD-10-CM | POA: Insufficient documentation

## 2013-01-06 DIAGNOSIS — E039 Hypothyroidism, unspecified: Secondary | ICD-10-CM | POA: Insufficient documentation

## 2013-01-06 DIAGNOSIS — I2510 Atherosclerotic heart disease of native coronary artery without angina pectoris: Secondary | ICD-10-CM | POA: Insufficient documentation

## 2013-01-06 DIAGNOSIS — R42 Dizziness and giddiness: Secondary | ICD-10-CM | POA: Insufficient documentation

## 2013-01-06 DIAGNOSIS — I10 Essential (primary) hypertension: Secondary | ICD-10-CM | POA: Insufficient documentation

## 2013-01-06 NOTE — ED Provider Notes (Signed)
History    This chart was scribed for Renne Crigler, a non-physician practitioner working with Derwood Kaplan, MD by Frederik Pear, ED Scribe. This patient was seen in room WTR6/WTR6 and the patient's care was started at .   CSN: [id number]  Arrival date & time 01/06/13  1436   None     Chief Complaint  Patient presents with  . Wound Check    (Consider location/radiation/quality/duration/timing/severity/associated sxs/prior treatment) The history is provided by the patient and medical records. No language interpreter was used.    HPI Comments: Troy Holden is a 62 y.o. male with a h/o of HIV who presents to the Emergency Department complaining of a wound check of a left gluteal abscess that was I&Ded by Dr. Preston Fleeting in the ED 4 days ago. He states that he had a colonoscopy performed before coming to the ED on 06/10 because of a positive PET scan that indicated a rectal mass, which was biopsied. He came to the ED at the recommendation of his GI after finding blood in his pants as well as associated nausea and lightheadeness. A CT scan performed indicated an abscess with cellulitis. During today's visit, he states that the pain has been gradually improving since last visit, and he is now able to ambulate without a cane. He has been afebrile and denies any other symptoms since his previous visit. He states that he has been taking Bactrim as prescribed.   PCP is Dr. Justice Rocher in [place].   Past Medical History  Diagnosis Date  . Heart disease   . Cancer     lymphoma  . HIV (human immunodeficiency virus infection)   . Anemia   . Anxiety   . Blood transfusion   . Cataract   . Depression   . GERD (gastroesophageal reflux disease)   . Thyroid disease     low thyroid  . 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 HIV (human immunodeficiency virus infection) 04/20/2011  . Dementia 04/20/2011  . CAD (coronary artery disease)  04/20/2011  . Hypothyroidism 04/20/2011  . Dyslipidemia 04/20/2011  . Hypertension     Past Surgical History  Procedure Laterality Date  . Cardiac surgery    . Abdominal surgery    . Colonoscopy      Family History  Problem Relation Age of Onset  . Colon cancer Mother   . Colon cancer Brother     History  Substance Use Topics  . Smoking status: Current Some Day Smoker -- 0.25 packs/day for 10 years    Types: Cigarettes    Last Attempt to Quit: 03/30/2011  . Smokeless tobacco: Not on file  . Alcohol Use: Yes     Comment: rarely      Review of Systems  Constitutional: Negative for fever, diaphoresis, appetite change, fatigue and unexpected weight change.  HENT: Negative for mouth sores and neck stiffness.   Eyes: Negative for visual disturbance.  Respiratory: Negative for cough, chest tightness, shortness of breath and wheezing.   Cardiovascular: Negative for chest pain.  Gastrointestinal: Negative for nausea, vomiting, abdominal pain, diarrhea and constipation.  Endocrine: Negative for polydipsia, polyphagia and polyuria.  Genitourinary: Negative for dysuria, urgency, frequency and hematuria.  Musculoskeletal: Negative for back pain.  Skin: Positive for wound. Negative for rash.  Allergic/Immunologic: Negative for immunocompromised state.  Neurological: Negative for syncope, light-headedness and headaches.  Hematological: Does not bruise/bleed easily.  Psychiatric/Behavioral: Negative for sleep disturbance. The patient is  not nervous/anxious.     Allergies  Lidocaine hcl and Trazodone and nefazodone  Home Medications   Current Outpatient Rx  Name  Route  Sig  Dispense  Refill  . clopidogrel (PLAVIX) 75 MG tablet   Oral   Take 75 mg by mouth daily.           . diazepam (VALIUM) 5 MG tablet   Oral   Take 5 mg by mouth every 6 (six) hours as needed for anxiety.         . dolutegravir (TIVICAY) 50 MG tablet   Oral   Take 50 mg by mouth daily.         Marland Kitchen  emtricitabine-tenofovir (TRUVADA) 200-300 MG per tablet   Oral   Take 1 tablet by mouth daily.         Marland Kitchen ezetimibe (ZETIA) 10 MG tablet   Oral   Take 10 mg by mouth daily.         Marland Kitchen gabapentin (NEURONTIN) 300 MG capsule   Oral   Take 300 mg by mouth 3 (three) times daily.         Marland Kitchen levothyroxine (SYNTHROID, LEVOTHROID) 112 MCG tablet   Oral   Take 112 mcg by mouth daily.           Marland Kitchen lisinopril (PRINIVIL,ZESTRIL) 10 MG tablet   Oral   Take 10 mg by mouth daily.         . metoprolol succinate (TOPROL-XL) 25 MG 24 hr tablet   Oral   Take 12.5 mg by mouth daily.           . nitroGLYCERIN (NITROSTAT) 0.4 MG SL tablet   Sublingual   Place 0.4 mg under the tongue every 5 (five) minutes as needed.           Marland Kitchen oxyCODONE (OXY IR/ROXICODONE) 5 MG immediate release tablet   Oral   Take 5 mg by mouth every 4 (four) hours as needed for pain.         . pantoprazole (PROTONIX) 40 MG tablet   Oral   Take 1 tablet (40 mg total) by mouth daily at 12 noon.   30 tablet   0   . pantoprazole (PROTONIX) 40 MG tablet   Oral   Take 40 mg by mouth daily.         . valACYclovir (VALTREX) 500 MG tablet   Oral   Take 500 mg by mouth 2 (two) times daily.         Marland Kitchen zolpidem (AMBIEN) 10 MG tablet   Oral   Take 10 mg by mouth at bedtime as needed for sleep.         Marland Kitchen sulfamethoxazole-trimethoprim (SEPTRA DS) 800-160 MG per tablet   Oral   Take 1 tablet by mouth 2 (two) times daily.   28 tablet   0     BP 100/55  Pulse 88  Temp(Src) 98.7 F (37.1 C) (Oral)  Resp 16  SpO2 96%  Physical Exam  Nursing note and vitals reviewed. Constitutional: He appears well-developed and well-nourished. No distress.  HENT:  Head: Normocephalic and atraumatic.  Eyes: EOM are normal. Pupils are equal, round, and reactive to light.  Neck: Normal range of motion. Neck supple. No tracheal deviation present.  Cardiovascular: Normal rate.   Pulmonary/Chest: Effort normal. No  respiratory distress.  Abdominal: Soft. He exhibits no distension.  Musculoskeletal: Normal range of motion. He exhibits no edema.  Neurological: He is alert.  Skin: Skin is warm and dry.  4-5 cm of induration to the left buttock with a small opening with very mild drainage.   Psychiatric: He has a normal mood and affect. His behavior is normal.    ED Course  Procedures (including critical care time)  DIAGNOSTIC STUDIES: Oxygen Saturation is 96% on room air, normal by my interpretation.    COORDINATION OF CARE:  15:15- Discussed planned course of treatment with the patient, including following up with a PCP and returning if symptoms worsen or if a fever develops, who is agreeable at this time. Encouraged sitz baths.   Labs Reviewed - No data to display No results found.   1. Abscess of buttock    3:34 PM Patient seen and examined.   Vital signs reviewed and are as follows: Filed Vitals:   01/06/13 1445  BP: 100/55  Pulse: 88  Temp: 98.7 F (37.1 C)  Resp: 16   The patient was urged to return to the Emergency Department urgently with worsening pain, swelling, expanding erythema especially if it streaks away from the affected area, fever, or if they have any other concerns.   The patient was urged to return to the Emergency Department or go to their PCP in 48 hours for wound recheck if the area is not significantly improved.  The patient verbalized understanding and stated agreement with this plan.   MDM  Pt with abscess, h/o HIV, showing improvement on Bactrim. No systemic symptoms of illness. Wound appears well healing, continues to be opened with decreased drainage. Pt to continue Bactrim. He has f/u with PCP next week. Return indicated if worsening, pt understands. He appears well, non-toxic.   I personally performed the services described in this documentation, which was scribed in my presence. The recorded information has been reviewed and is  accurate.       Renne Crigler, PA-C 01/06/13 1537

## 2013-01-06 NOTE — Discharge Instructions (Signed)
Please read and follow all provided instructions.  Your diagnoses today include:  1. Abscess of buttock     Tests performed today include:  Vital signs. See below for your results today.   Medications prescribed:   None  Continue oral antibiotics until they are completely gone.   Take any prescribed medications only as directed.   Home care instructions:   Follow any educational materials contained in this packet  Follow-up instructions: Return to the Emergency Department in 48 hours for a recheck if your symptoms are not significantly improved.  Please follow-up with your primary care provider in the next 1 week for further evaluation of your symptoms. If you do not have a primary care doctor -- see below for referral information.   Return instructions:  Return to the Emergency Department if you have:  Fever  Worsening symptoms  Worsening pain  Worsening swelling  Redness of the skin that moves away from the affected area, especially if it streaks away from the affected area   Any other emergent concerns  Your vital signs today were: BP 100/55   Pulse 88   Temp(Src) 98.7 F (37.1 C) (Oral)   Resp 16   SpO2 96% If your blood pressure (BP) was elevated above 135/85 this visit, please have this repeated by your doctor within one month. -------------- RESOURCE GUIDE  Chronic Pain Problems:  [facility]:  [phone]  Patients need to be referred by their primary care doctor  Insufficient Money for Medicine:  [employer]:     call "211"  [facility]:  671-284-8800  No Primary Care Doctor:  To locate a primary care doctor that accepts your insurance or provides certain services:  [facility] :   9808475348  Physician Referral Service:  [phone]  Agencies that provide inexpensive medical care:  [facility]:   [phone]  [facility]:   [phone]  [facility]:    [phone]  Women's Clinic:     [phone]  Planned Parenthood:    6408239219  [facility]:     [phone]  Medicaid-accepting [facility] Providers:  [facility]  [address], [phone], Mon-Fri 9am-7pm, Sat 9am-1pm  [facility]  [address] [place], [address] [place], [phone]  [facility]  [address], Suite [place], [phone]  [facility]  [address] [phone]  Renaye Rakers  [address], [phone]  Only accepts [place] [employer] patients after they have their name  applied to their card  Self Pay (no insurance) in [facility]:  Sickle Cell Patients: Dr. Willey Blade, [facility]  [address] [place], [phone]  [facility]  [address] [place], [phone]  [facility] [place]  [facility] [address], [facility] [address]  [phone], Mon-Fri 9am-7pm, Sat 9am-1pm  [facility]  [address] [place], [phone]  [facility]  [address], [phone]  [facility]  [address] [phone]  Dr. Julio Sicks  [address], [place], [phone]  [facility]  [address], [phone]  [facility]  [address], [phone]  [address], [phone]  [facility]  [address] [place], [phone], 1st &  3rd Saturday every month, 10am-1pm  Strategies for finding a Primary Care Provider:  1) Find a Doctor and Pay Out of Pocket Although you won't have to find out who is covered by your insurance plan, it is a good idea to ask around and get recommendations. You will then need to call the office and see if the doctor you have chosen will accept you as a new patient and what types of options they offer for patients who are self-pay. Some  doctors offer discounts or will set up payment plans for their patients who do not have insurance, but you will need to ask so you aren't surprised when you get to your appointment.  2) Contact Your Local Health Department Not all health departments have doctors that can see patients for sick visits, but many do, so it is worth a call to see if yours does. If you don't know where your local health department is, you can check in your phone book. The CDC also has a tool to help you locate your state's health department, and many state websites also have listings of all of their local health departments.  3) Find a Walk-in Clinic If your illness is not likely to be very severe or complicated, you may want to try a walk in clinic. These are popping up all over the country in pharmacies, drugstores, and shopping centers. They're usually staffed by nurse practitioners or physician assistants that have been trained to treat common illnesses and complaints. They're usually fairly quick and inexpensive. However, if you have serious medical issues or chronic medical problems, these are probably not your best option  STD Testing:  [facility] [place], [place] [facility]  [address], [place], phone [phone] or [phone]    Monday - Friday, call for an appointment  [facility] of [facility], [place] [facility]  [address], [place], phone [phone] or [phone]   Monday - Friday, call for an appointment  Abuse/Neglect:  [facility] Child Abuse Hotline:  9032194045  [facility] Child Abuse Hotline: 854-233-5654 (After Hours)  Emergency Shelter:  [facility] [phone]  Maternity Homes:  [facility] at the [place] [facility]: 507-542-3171  [facility]: 320-023-3064  MRSA Hotline:   720-805-7526   [facility] [place]  [address] [place]  [phone]   [place]  [address], [place]  [phone]   [facility]  [address], [place]  [phone]   [facility]  [phone]   [facility]  [phone]   [facility] in [place]  [address]  [phone], [facility] Child Abuse Hotline  6061370031  [phone] (After Hours)   Behavioral Health Services  Substance Abuse Resources:  Alcohol and Drug Services:     435-207-0777  [facility]:   [phone]  [facility]:     [phone]  [facility]:      [phone]  Residential & Outpatient Substance Abuse Program: [phone]) [phone]  Psychological Services:  [facility]:   [phone]  [facility] Services:    [phone]  [facility]  [address], [place]  ACCESS LINE: 9852062741 or [phone]  [employer]  Dental Assistance: If unable to pay or uninsured, contact:  [facility] or [facility]. to  become qualified for the adult dental clinic.  Patients with Medicaid  [facility]  [phone] [address], [phone]  [address] [phone]  If unable to pay, or uninsured: contact [facility] [phone]) or [facility] Department [phone] in [place], [phone] in [facility]) to become qualified for the adult dental clinic  Other Low-Cost Community Dental Services:  [facility]  [address] [place], [place], [place], [address]  [phone], Ext. [phone]  2nd and 4th Thursday of the month at [facility]  [facility]  [address] [place], [place], [place], [address]  [phone]  [facility]  [address], [place], [place], [address]  [phone]  [facility] Health Department  [phone]  [facility]  Health Department  [phone]  [facility] Health Department  [phone]

## 2013-01-06 NOTE — ED Notes (Signed)
Pt states that he was here a few days ago to have an abscess on his bottom drained.  Is here today for a follow up.  Denies any issues.

## 2013-01-07 NOTE — ED Provider Notes (Signed)
Medical screening examination/treatment/procedure(s) were performed by non-physician practitioner and as supervising physician I was immediately available for consultation/collaboration.  Anistyn Graddy, MD 01/07/13 1512 

## 2013-02-20 IMAGING — CR DG CHEST 1V PORT
1 series · 1 of 1 positions shown · non-contrast
Comparison: 3063 hours the same day and earlier.

CLINICAL DATA: 60-year-old male central line placement.

PORTABLE CHEST - 1 VIEW

[view not recorded]
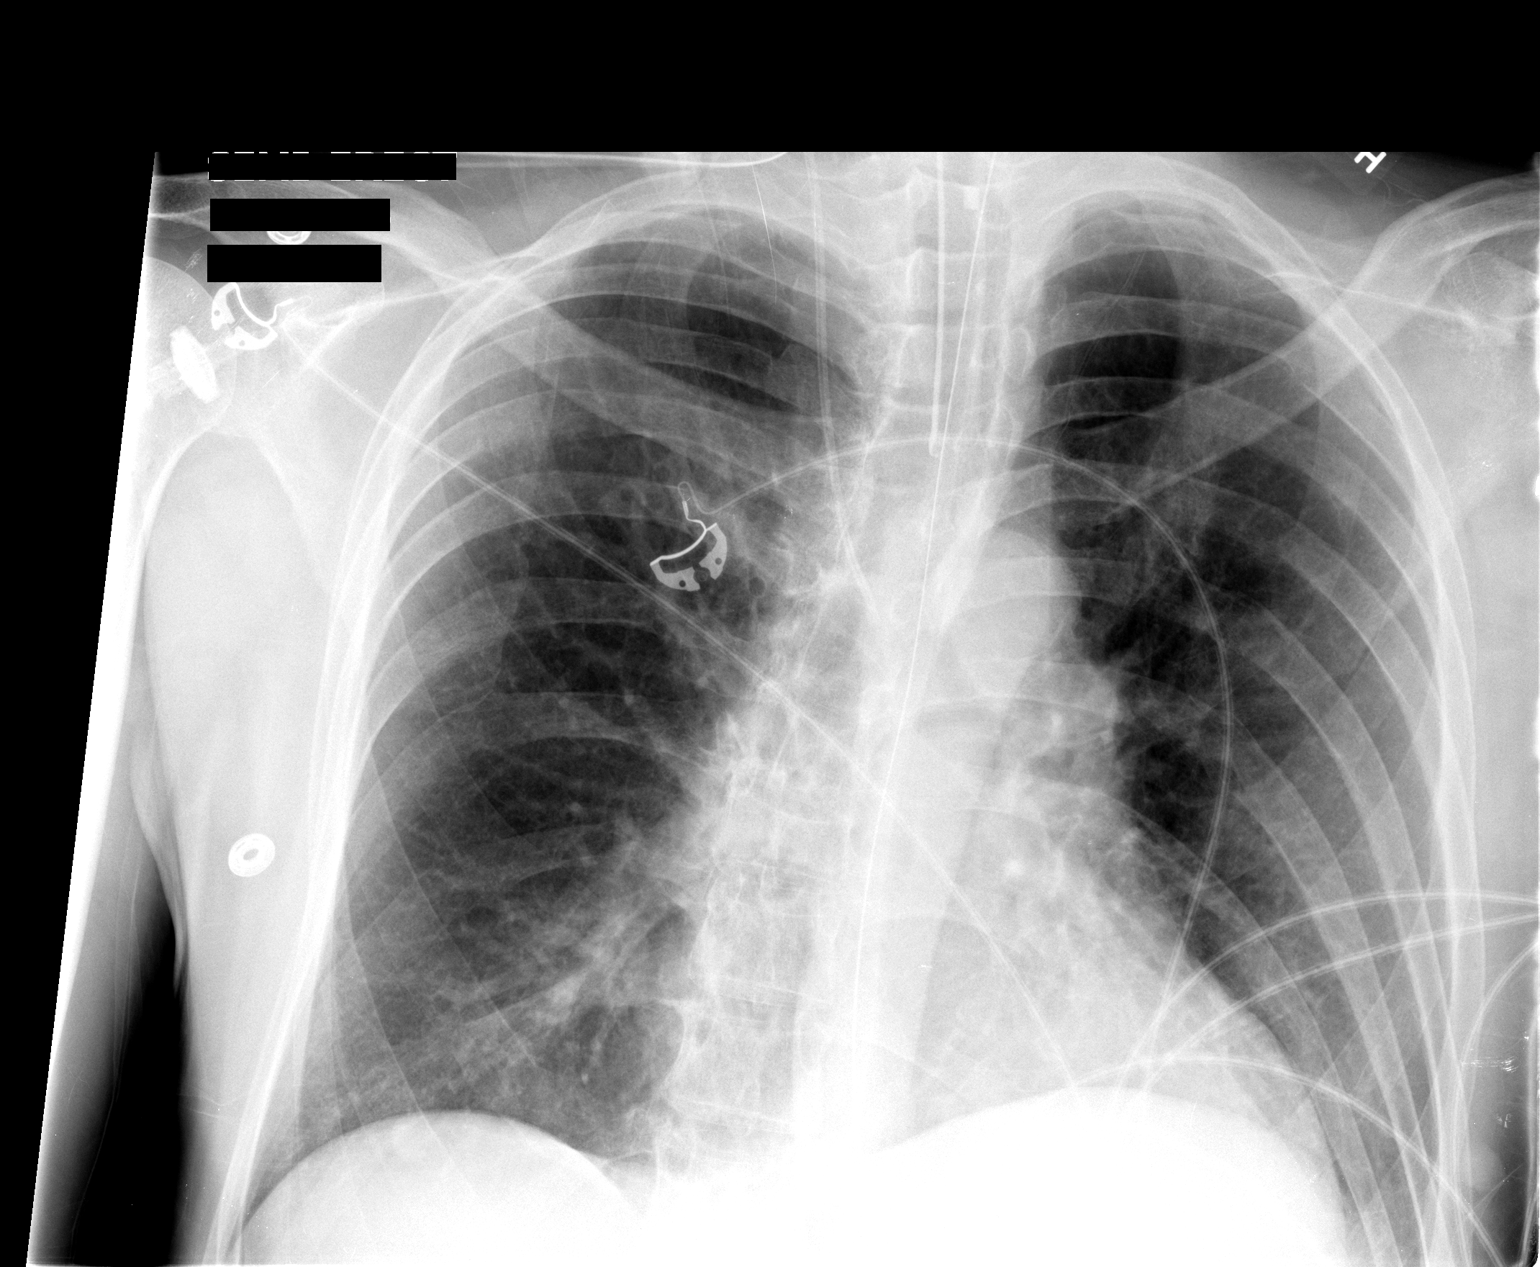

[1 of 1 positions shown; findings below may reference images not displayed]

FINDINGS: AP portable semi upright view 2244 hours.  Right IJ
approach central line placed.  Tip just above the level of the
carina.  Endotracheal tube tip in good position at the level of
clavicles.  Enteric tube courses to the abdomen, tip not included.
No pneumothorax, pulmonary edema, pleural effusion or confluent
pulmonary opacity.
IMPRESSION: 1.  Right IJ central line.  Tip at the level of the SVC.  No
pneumothorax.
2.  Enteric tube placed courses to the abdomen.
3. No acute cardiopulmonary abnormality.

## 2013-02-20 IMAGING — CR DG CHEST 1V PORT
1 series · 1 of 1 positions shown · non-contrast
Comparison: Portable exam 4769 hours compared to 09/29/2011

CLINICAL DATA: Intubation

PORTABLE CHEST - 1 VIEW

[view not recorded]
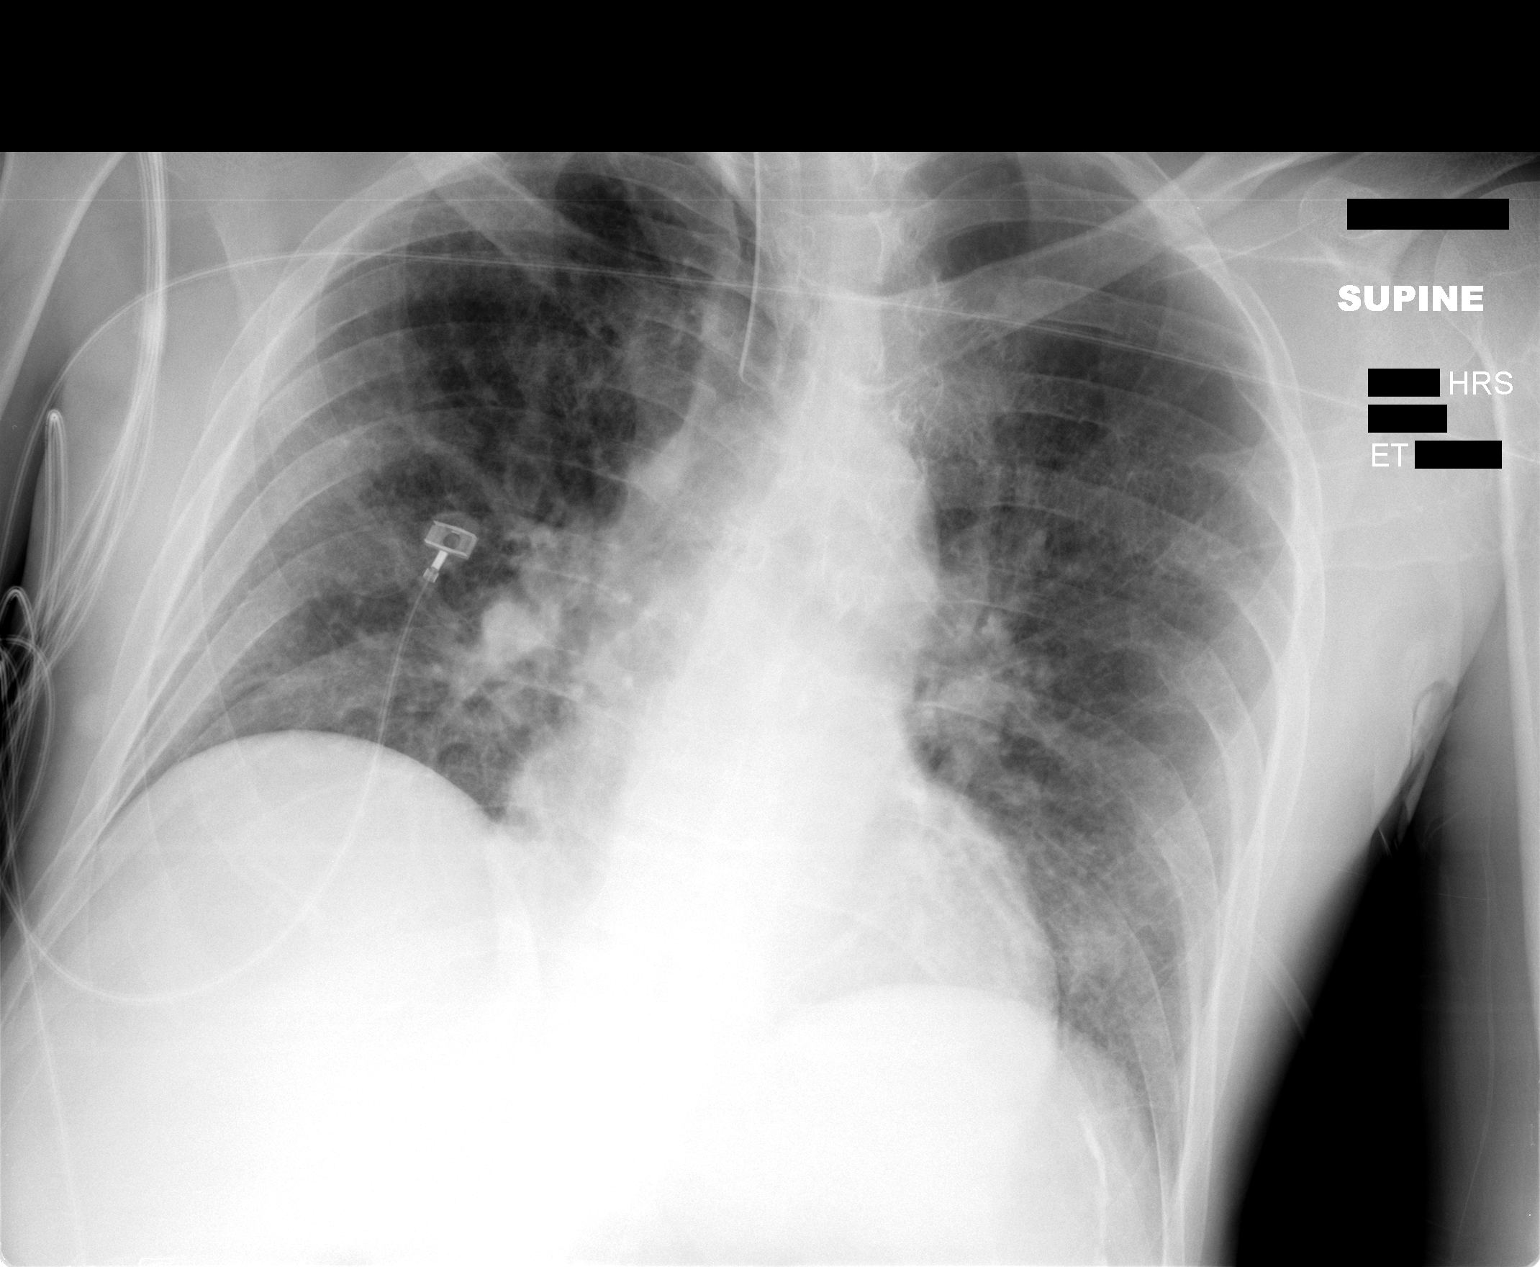

[1 of 1 positions shown; findings below may reference images not displayed]

FINDINGS: New endotracheal tube, tip 5.0 cm above carina.
Normal heart size and mediastinal contours.
Question hazy bilateral perihilar infiltrates.
No definite pleural effusion or pneumothorax.
IMPRESSION: Satisfactory endotracheal tube position.
Questionable hazy bilateral perihilar infiltrates.

## 2013-02-21 IMAGING — CR DG CHEST 1V PORT
1 series · 1 of 1 positions shown · non-contrast
Comparison: Portable exam 6212 hours compared to 09/30/2011

CLINICAL DATA: Intubation

PORTABLE CHEST - 1 VIEW

[AP]
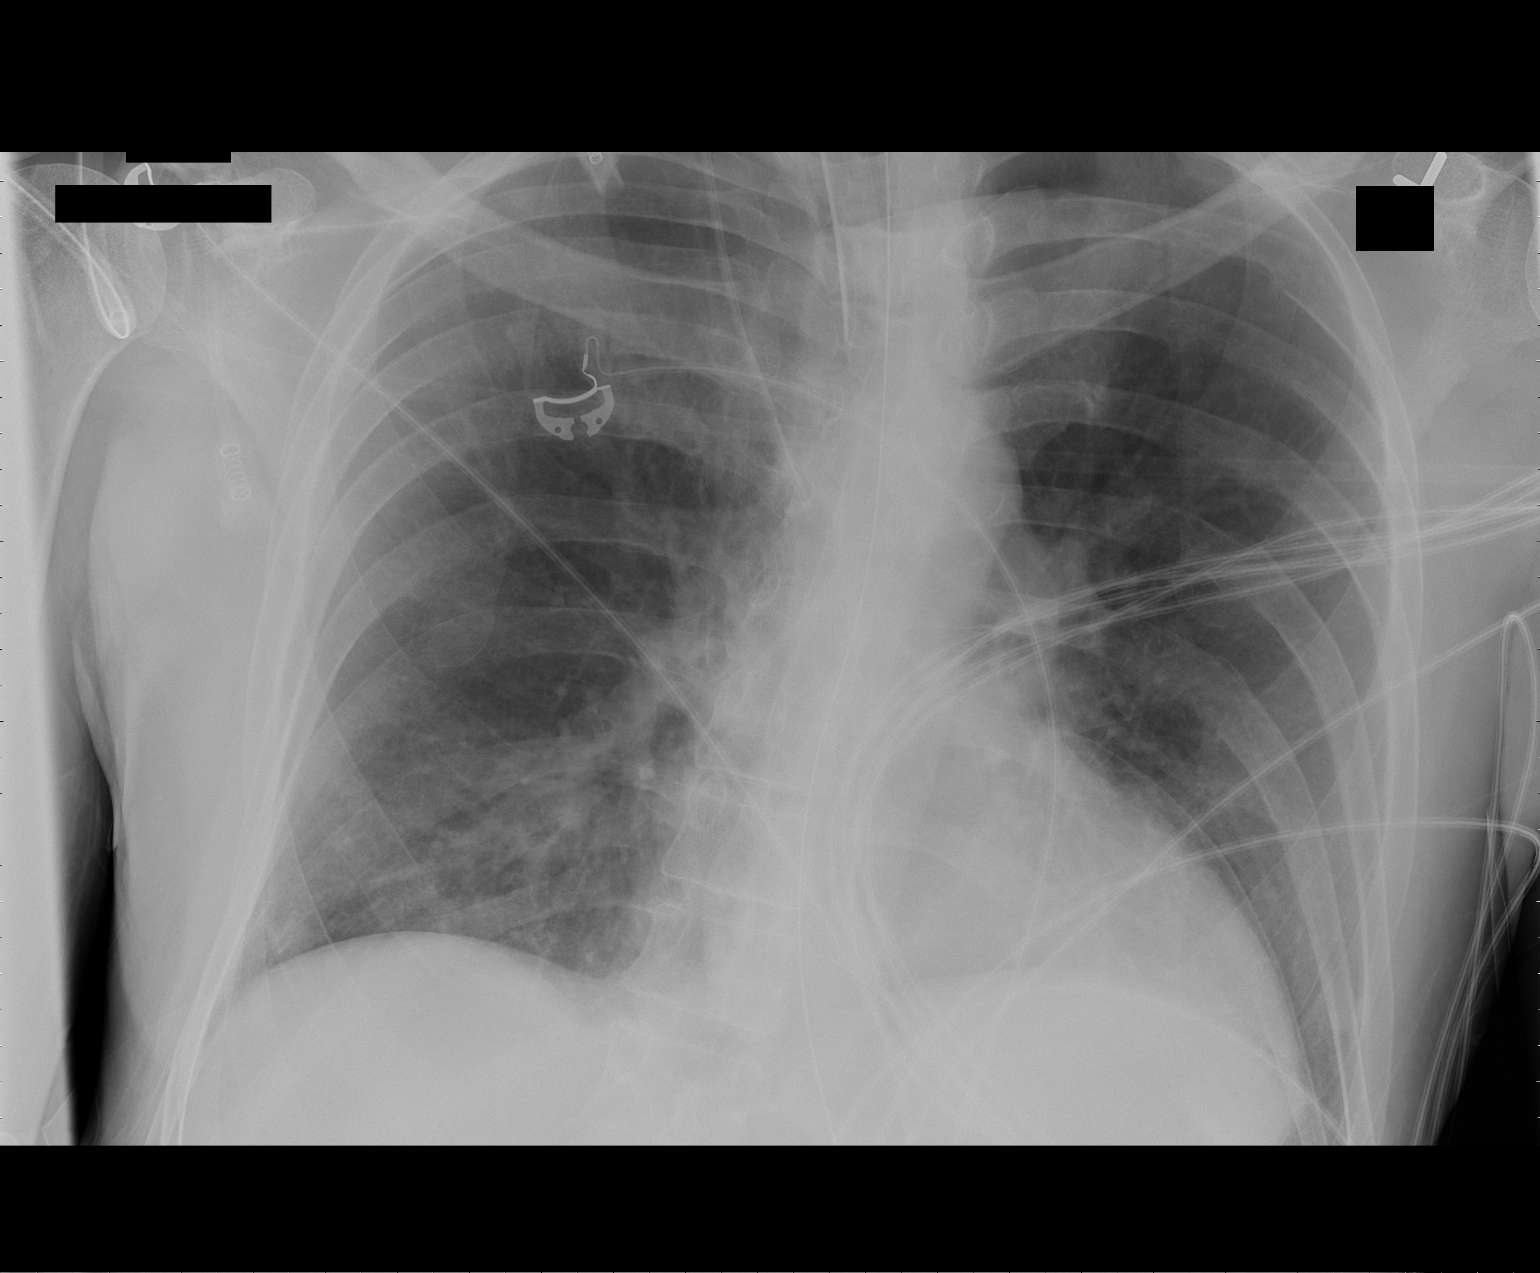

[1 of 1 positions shown; findings below may reference images not displayed]

FINDINGS: Tip of endotracheal tube 5.9 cm above carina.
Right jugular central venous catheter, tip projects over SVC.
Nasogastric extends into stomach.
Normal heart size, mediastinal contours, and pulmonary vascularity.
Atelectasis versus infiltrate at both lung bases greater on right.
Remaining lungs clear.
Question underlying emphysematous changes.
No pneumothorax.
IMPRESSION: Satisfactory line and tube positions.
Mild atelectasis versus infiltrate at lung bases.

## 2013-02-22 IMAGING — CR DG CHEST 1V PORT
1 series · 1 of 1 positions shown · non-contrast
Comparison: 10/02/2011, [DATE] hours

CLINICAL DATA: Respiratory failure

PORTABLE CHEST - 1 VIEW

[view not recorded]
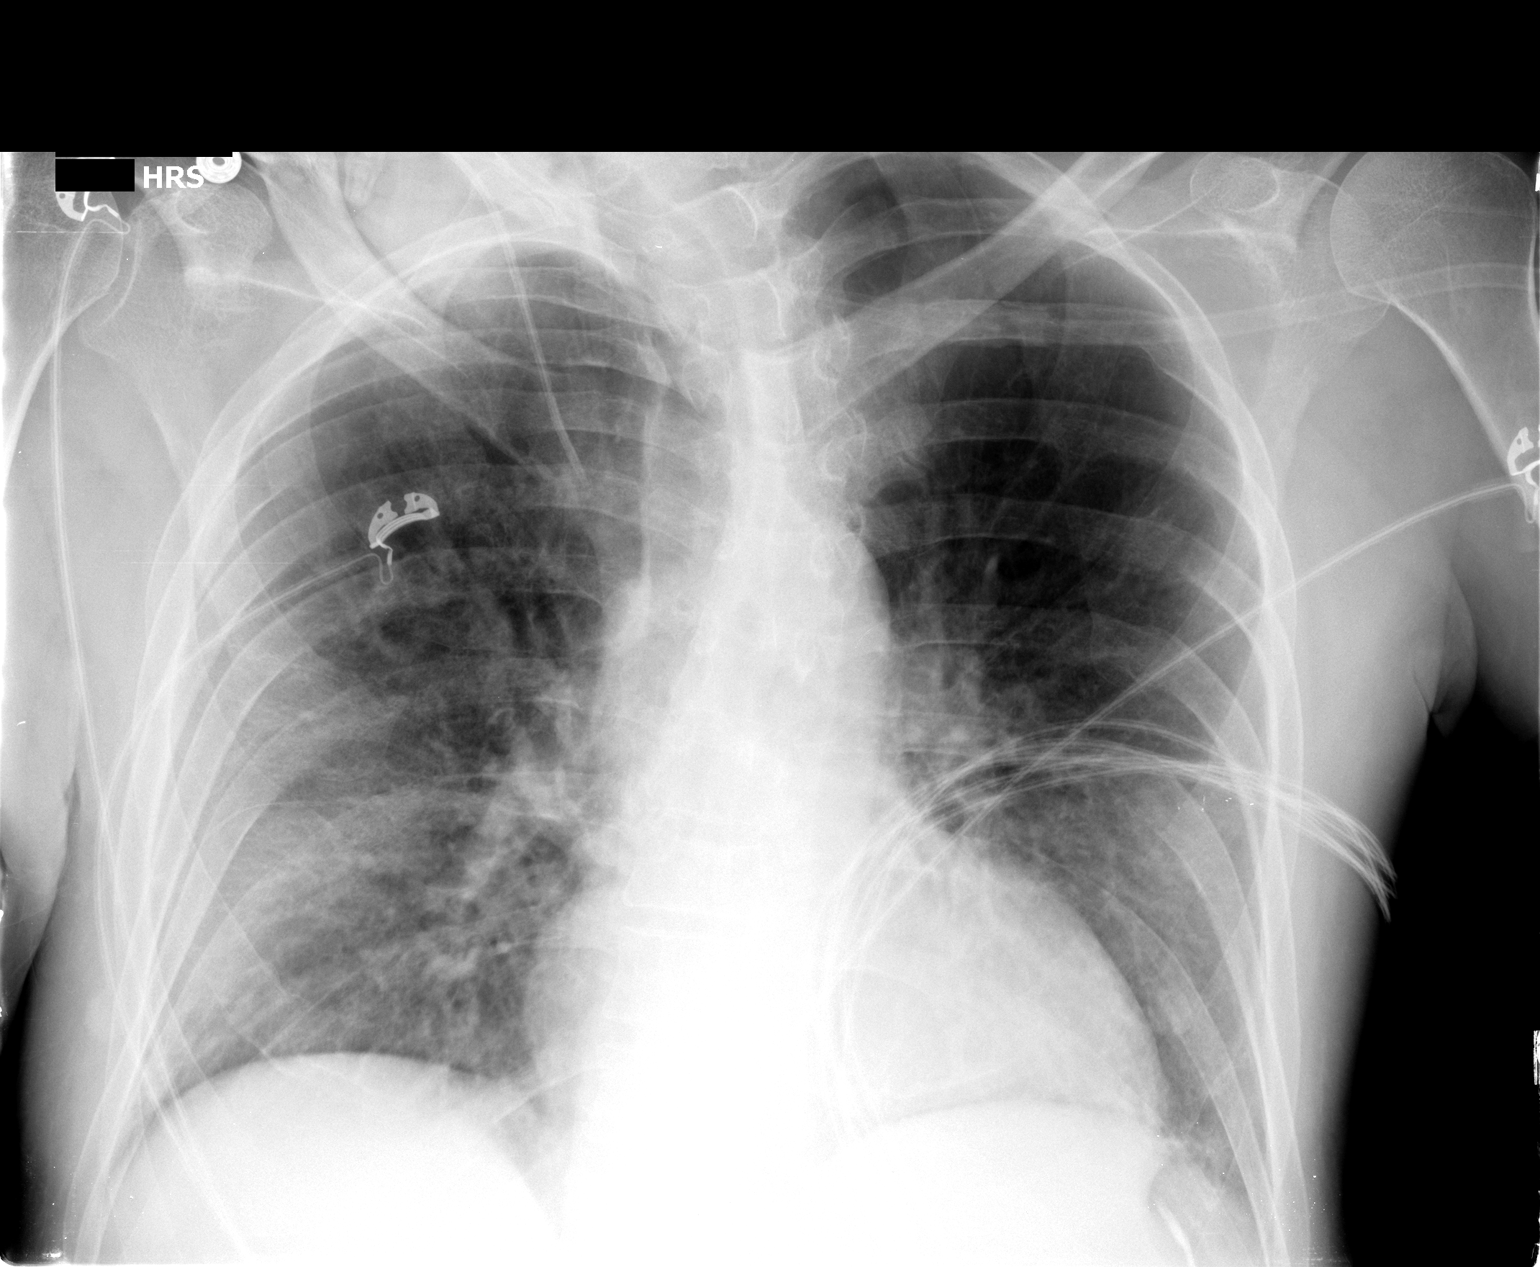

[1 of 1 positions shown; findings below may reference images not displayed]

FINDINGS: Stable positioning of central line.  Mild atelectasis at
both lung bases.  No edema, infiltrate or pleural fluid identified.
Heart size is normal.  No pneumothorax.
IMPRESSION: Mild bibasilar atelectasis.

## 2013-04-12 ENCOUNTER — Inpatient Hospital Stay (HOSPITAL_COMMUNITY)
Admission: EM | Admit: 2013-04-12 | Discharge: 2013-04-17 | DRG: 975 | Disposition: A | Discharge status: Home / Self Care | Payer: Medicaid Other | Attending: Internal Medicine | Admitting: Internal Medicine

## 2013-04-12 ENCOUNTER — Encounter (HOSPITAL_COMMUNITY): Payer: Self-pay | Admitting: *Deleted

## 2013-04-12 DIAGNOSIS — E871 Hypo-osmolality and hyponatremia: Secondary | ICD-10-CM

## 2013-04-12 DIAGNOSIS — E039 Hypothyroidism, unspecified: Secondary | ICD-10-CM

## 2013-04-12 DIAGNOSIS — B20 Human immunodeficiency virus [HIV] disease: Secondary | ICD-10-CM

## 2013-04-12 DIAGNOSIS — A419 Sepsis, unspecified organism: Principal | ICD-10-CM

## 2013-04-12 DIAGNOSIS — J189 Pneumonia, unspecified organism: Secondary | ICD-10-CM

## 2013-04-12 DIAGNOSIS — I10 Essential (primary) hypertension: Secondary | ICD-10-CM | POA: Diagnosis present

## 2013-04-12 DIAGNOSIS — C189 Malignant neoplasm of colon, unspecified: Secondary | ICD-10-CM

## 2013-04-12 DIAGNOSIS — C4492 Squamous cell carcinoma of skin, unspecified: Secondary | ICD-10-CM

## 2013-04-12 DIAGNOSIS — F17200 Nicotine dependence, unspecified, uncomplicated: Secondary | ICD-10-CM | POA: Diagnosis present

## 2013-04-12 DIAGNOSIS — C20 Malignant neoplasm of rectum: Secondary | ICD-10-CM | POA: Diagnosis present

## 2013-04-12 DIAGNOSIS — G92 Toxic encephalopathy: Secondary | ICD-10-CM

## 2013-04-12 DIAGNOSIS — B2700 Gammaherpesviral mononucleosis without complication: Secondary | ICD-10-CM

## 2013-04-12 DIAGNOSIS — R634 Abnormal weight loss: Secondary | ICD-10-CM | POA: Diagnosis present

## 2013-04-12 DIAGNOSIS — Z21 Asymptomatic human immunodeficiency virus [HIV] infection status: Secondary | ICD-10-CM

## 2013-04-12 DIAGNOSIS — D61818 Other pancytopenia: Secondary | ICD-10-CM

## 2013-04-12 DIAGNOSIS — IMO0002 Reserved for concepts with insufficient information to code with codable children: Secondary | ICD-10-CM

## 2013-04-12 DIAGNOSIS — E860 Dehydration: Secondary | ICD-10-CM

## 2013-04-12 DIAGNOSIS — F0390 Unspecified dementia without behavioral disturbance: Secondary | ICD-10-CM

## 2013-04-12 DIAGNOSIS — I2510 Atherosclerotic heart disease of native coronary artery without angina pectoris: Secondary | ICD-10-CM

## 2013-04-12 DIAGNOSIS — J40 Bronchitis, not specified as acute or chronic: Secondary | ICD-10-CM | POA: Diagnosis present

## 2013-04-12 DIAGNOSIS — B59 Pneumocystosis: Secondary | ICD-10-CM

## 2013-04-12 DIAGNOSIS — G928 Other toxic encephalopathy: Secondary | ICD-10-CM

## 2013-04-12 DIAGNOSIS — K219 Gastro-esophageal reflux disease without esophagitis: Secondary | ICD-10-CM | POA: Diagnosis present

## 2013-04-12 DIAGNOSIS — C8330 Diffuse large B-cell lymphoma, unspecified site: Secondary | ICD-10-CM

## 2013-04-12 DIAGNOSIS — R7989 Other specified abnormal findings of blood chemistry: Secondary | ICD-10-CM

## 2013-04-12 DIAGNOSIS — E872 Acidosis, unspecified: Secondary | ICD-10-CM

## 2013-04-12 DIAGNOSIS — R630 Anorexia: Secondary | ICD-10-CM | POA: Diagnosis present

## 2013-04-12 DIAGNOSIS — T451X5A Adverse effect of antineoplastic and immunosuppressive drugs, initial encounter: Secondary | ICD-10-CM | POA: Diagnosis present

## 2013-04-12 DIAGNOSIS — E785 Hyperlipidemia, unspecified: Secondary | ICD-10-CM

## 2013-04-12 DIAGNOSIS — C8589 Other specified types of non-Hodgkin lymphoma, extranodal and solid organ sites: Secondary | ICD-10-CM | POA: Diagnosis present

## 2013-04-12 HISTORY — DX: Inflammatory liver disease, unspecified: K75.9

## 2013-04-12 HISTORY — DX: Pneumonia, unspecified organism: J18.9

## 2013-04-12 HISTORY — DX: Shortness of breath: R06.02

## 2013-04-12 HISTORY — DX: Cardiac arrhythmia, unspecified: I49.9

## 2013-04-12 LAB — CBC
Hemoglobin: 9.1 g/dL — ABNORMAL LOW (ref 13.0–17.0)
MCH: 35.3 pg — ABNORMAL HIGH (ref 26.0–34.0)
MCHC: 34.7 g/dL (ref 30.0–36.0)
Platelets: 87 10*3/uL — ABNORMAL LOW (ref 150–400)

## 2013-04-12 LAB — BASIC METABOLIC PANEL
Calcium: 8.8 mg/dL (ref 8.4–10.5)
Creatinine, Ser: 0.91 mg/dL (ref 0.50–1.35)
GFR calc non Af Amer: >90 mL/min (ref >90)
Glucose, Bld: 127 mg/dL — ABNORMAL HIGH (ref 70–99)
Sodium: 134 mEq/L — ABNORMAL LOW (ref 135–145)

## 2013-04-12 MED ORDER — ONDANSETRON 4 MG PO TBDP
8.0000 mg | ORAL_TABLET | Freq: Once | ORAL | Status: AC
  Administered 2013-04-13: 8 mg via ORAL
  Filled 2013-04-12: qty 2

## 2013-04-12 MED ORDER — SODIUM CHLORIDE 0.9 % IV SOLN
1000.0000 mL | Freq: Once | INTRAVENOUS | Status: AC
  Administered 2013-04-12: 1000 mL via INTRAVENOUS

## 2013-04-12 NOTE — ED Notes (Addendum)
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 Symptoms x 4 days. Poor appetite.  Was told that radiation will weaken the calcium in the bones.

## 2013-04-12 NOTE — ED Notes (Signed)
To ED from home via EMS for productive cough X1w, VSS, NAD

## 2013-04-13 ENCOUNTER — Encounter (HOSPITAL_COMMUNITY): Payer: Self-pay | Admitting: Family Medicine

## 2013-04-13 ENCOUNTER — Emergency Department (HOSPITAL_COMMUNITY): Payer: Medicaid Other

## 2013-04-13 DIAGNOSIS — A419 Sepsis, unspecified organism: Secondary | ICD-10-CM | POA: Diagnosis present

## 2013-04-13 DIAGNOSIS — J189 Pneumonia, unspecified organism: Secondary | ICD-10-CM | POA: Diagnosis present

## 2013-04-13 DIAGNOSIS — C8589 Other specified types of non-Hodgkin lymphoma, extranodal and solid organ sites: Secondary | ICD-10-CM

## 2013-04-13 LAB — URINALYSIS, ROUTINE W REFLEX MICROSCOPIC
Bilirubin Urine: NEGATIVE
Glucose, UA: NEGATIVE mg/dL
Ketones, ur: NEGATIVE mg/dL
Leukocytes, UA: NEGATIVE
Protein, ur: 30 mg/dL — AB
Specific Gravity, Urine: 1.02 (ref 1.005–1.030)
pH: 6.5 (ref 5.0–8.0)

## 2013-04-13 LAB — RETICULOCYTES
RBC.: 2.25 MIL/uL — ABNORMAL LOW (ref 4.22–5.81)
Retic Count, Absolute: 87.8 10*3/uL (ref 19.0–186.0)
Retic Ct Pct: 3.9 % — ABNORMAL HIGH (ref 0.4–3.1)

## 2013-04-13 LAB — HIV ANTIBODY (ROUTINE TESTING W REFLEX): HIV: REACTIVE — AB

## 2013-04-13 LAB — STREP PNEUMONIAE URINARY ANTIGEN: Strep Pneumo Urinary Antigen: NEGATIVE

## 2013-04-13 LAB — BASIC METABOLIC PANEL
CO2: 24 mEq/L (ref 19–32)
Glucose, Bld: 147 mg/dL — ABNORMAL HIGH (ref 70–99)
Potassium: 4.2 mEq/L (ref 3.5–5.1)
Sodium: 140 mEq/L (ref 135–145)

## 2013-04-13 LAB — T-HELPER CELLS (CD4) COUNT (NOT AT ARMC): CD4 % Helper T Cell: 18 % — ABNORMAL LOW (ref 33–55)

## 2013-04-13 LAB — CBC
HCT: 25.1 % — ABNORMAL LOW (ref 39.0–52.0)
Hemoglobin: 8.7 g/dL — ABNORMAL LOW (ref 13.0–17.0)
MCH: 35.7 pg — ABNORMAL HIGH (ref 26.0–34.0)
RBC: 2.44 MIL/uL — ABNORMAL LOW (ref 4.22–5.81)

## 2013-04-13 LAB — TYPE AND SCREEN
ABO/RH(D): B NEG
Antibody Screen: NEGATIVE

## 2013-04-13 LAB — LEGIONELLA ANTIGEN, URINE

## 2013-04-13 LAB — ABO/RH: ABO/RH(D): B NEG

## 2013-04-13 MED ORDER — EZETIMIBE 10 MG PO TABS
10.0000 mg | ORAL_TABLET | Freq: Every day | ORAL | Status: DC
  Administered 2013-04-13 – 2013-04-17 (×5): 10 mg via ORAL
  Filled 2013-04-13 (×5): qty 1

## 2013-04-13 MED ORDER — ZOLPIDEM TARTRATE 5 MG PO TABS
10.0000 mg | ORAL_TABLET | Freq: Every evening | ORAL | Status: DC | PRN
  Administered 2013-04-14 – 2013-04-16 (×3): 10 mg via ORAL
  Filled 2013-04-13 (×3): qty 2

## 2013-04-13 MED ORDER — SULFAMETHOXAZOLE-TRIMETHOPRIM 400-80 MG/5ML IV SOLN
320.0000 mg | Freq: Four times a day (QID) | INTRAVENOUS | Status: DC
  Administered 2013-04-13 – 2013-04-14 (×5): 320 mg via INTRAVENOUS
  Filled 2013-04-13 (×9): qty 20

## 2013-04-13 MED ORDER — ADULT MULTIVITAMIN W/MINERALS CH
1.0000 | ORAL_TABLET | Freq: Every day | ORAL | Status: DC
  Administered 2013-04-13 – 2013-04-17 (×5): 1 via ORAL
  Filled 2013-04-13 (×5): qty 1

## 2013-04-13 MED ORDER — DOLUTEGRAVIR SODIUM 50 MG PO TABS
50.0000 mg | ORAL_TABLET | Freq: Every day | ORAL | Status: DC
  Administered 2013-04-13 – 2013-04-17 (×5): 50 mg via ORAL
  Filled 2013-04-13 (×5): qty 1

## 2013-04-13 MED ORDER — METHYLPREDNISOLONE SODIUM SUCC 125 MG IJ SOLR
125.0000 mg | Freq: Once | INTRAMUSCULAR | Status: AC
  Administered 2013-04-13: 125 mg via INTRAVENOUS
  Filled 2013-04-13: qty 2

## 2013-04-13 MED ORDER — CLOPIDOGREL BISULFATE 75 MG PO TABS
75.0000 mg | ORAL_TABLET | Freq: Every day | ORAL | Status: DC
  Administered 2013-04-13 – 2013-04-17 (×5): 75 mg via ORAL
  Filled 2013-04-13 (×5): qty 1

## 2013-04-13 MED ORDER — PIPERACILLIN-TAZOBACTAM 3.375 G IVPB 30 MIN
3.3750 g | Freq: Once | INTRAVENOUS | Status: AC
  Administered 2013-04-13: 3.375 g via INTRAVENOUS
  Filled 2013-04-13: qty 50

## 2013-04-13 MED ORDER — GABAPENTIN 300 MG PO CAPS
300.0000 mg | ORAL_CAPSULE | Freq: Three times a day (TID) | ORAL | Status: DC
  Administered 2013-04-13 – 2013-04-17 (×13): 300 mg via ORAL
  Filled 2013-04-13 (×15): qty 1

## 2013-04-13 MED ORDER — PANTOPRAZOLE SODIUM 40 MG PO TBEC
40.0000 mg | DELAYED_RELEASE_TABLET | Freq: Every day | ORAL | Status: DC
  Administered 2013-04-13 – 2013-04-17 (×5): 40 mg via ORAL
  Filled 2013-04-13 (×5): qty 1

## 2013-04-13 MED ORDER — SODIUM CHLORIDE 0.9 % IV SOLN
INTRAVENOUS | Status: DC
  Administered 2013-04-13 – 2013-04-16 (×9): via INTRAVENOUS

## 2013-04-13 MED ORDER — SULFAMETHOXAZOLE-TMP DS 800-160 MG PO TABS
1.0000 | ORAL_TABLET | Freq: Once | ORAL | Status: AC
  Administered 2013-04-13: 1 via ORAL
  Filled 2013-04-13: qty 1

## 2013-04-13 MED ORDER — DIAZEPAM 5 MG PO TABS
5.0000 mg | ORAL_TABLET | Freq: Four times a day (QID) | ORAL | Status: DC | PRN
Start: 2013-04-13 — End: 2013-04-17

## 2013-04-13 MED ORDER — ONDANSETRON HCL 4 MG PO TABS
8.0000 mg | ORAL_TABLET | Freq: Three times a day (TID) | ORAL | Status: DC | PRN
Start: 2013-04-13 — End: 2013-04-17

## 2013-04-13 MED ORDER — INFLUENZA VAC SPLIT QUAD 0.5 ML IM SUSP
0.5000 mL | INTRAMUSCULAR | Status: AC
  Administered 2013-04-14: 0.5 mL via INTRAMUSCULAR
  Filled 2013-04-13: qty 0.5

## 2013-04-13 MED ORDER — VANCOMYCIN HCL IN DEXTROSE 1-5 GM/200ML-% IV SOLN
1000.0000 mg | Freq: Three times a day (TID) | INTRAVENOUS | Status: DC
  Administered 2013-04-13 – 2013-04-16 (×10): 1000 mg via INTRAVENOUS
  Filled 2013-04-13 (×12): qty 200

## 2013-04-13 MED ORDER — EMTRICITABINE-TENOFOVIR DF 200-300 MG PO TABS
1.0000 | ORAL_TABLET | Freq: Every day | ORAL | Status: DC
  Administered 2013-04-13 – 2013-04-17 (×5): 1 via ORAL
  Filled 2013-04-13 (×5): qty 1

## 2013-04-13 MED ORDER — ENSURE COMPLETE PO LIQD
237.0000 mL | Freq: Three times a day (TID) | ORAL | Status: DC
  Administered 2013-04-13 – 2013-04-17 (×15): 237 mL via ORAL

## 2013-04-13 MED ORDER — DEXTROSE 5 % IV SOLN
1.0000 g | Freq: Three times a day (TID) | INTRAVENOUS | Status: DC
  Administered 2013-04-13 – 2013-04-16 (×10): 1 g via INTRAVENOUS
  Filled 2013-04-13 (×12): qty 1

## 2013-04-13 MED ORDER — SODIUM CHLORIDE 0.9 % IV BOLUS (SEPSIS)
1000.0000 mL | Freq: Once | INTRAVENOUS | Status: AC
  Administered 2013-04-13: 1000 mL via INTRAVENOUS

## 2013-04-13 MED ORDER — VALACYCLOVIR HCL 500 MG PO TABS
500.0000 mg | ORAL_TABLET | Freq: Two times a day (BID) | ORAL | Status: DC
  Administered 2013-04-13 – 2013-04-17 (×9): 500 mg via ORAL
  Filled 2013-04-13 (×10): qty 1

## 2013-04-13 MED ORDER — SODIUM CHLORIDE 0.9 % IV BOLUS (SEPSIS)
2000.0000 mL | Freq: Once | INTRAVENOUS | Status: AC
  Administered 2013-04-13: 2000 mL via INTRAVENOUS

## 2013-04-13 MED ORDER — HEPARIN SODIUM (PORCINE) 5000 UNIT/ML IJ SOLN
5000.0000 [IU] | Freq: Three times a day (TID) | INTRAMUSCULAR | Status: DC
  Administered 2013-04-13 – 2013-04-17 (×13): 5000 [IU] via SUBCUTANEOUS
  Filled 2013-04-13 (×16): qty 1

## 2013-04-13 MED ORDER — NAPROXEN 250 MG PO TABS
250.0000 mg | ORAL_TABLET | Freq: Two times a day (BID) | ORAL | Status: DC | PRN
  Filled 2013-04-13: qty 1

## 2013-04-13 MED ORDER — METOPROLOL SUCCINATE 12.5 MG HALF TABLET
12.5000 mg | ORAL_TABLET | Freq: Every day | ORAL | Status: DC
  Administered 2013-04-13 – 2013-04-17 (×4): 12.5 mg via ORAL
  Filled 2013-04-13 (×5): qty 1

## 2013-04-13 MED ORDER — VANCOMYCIN HCL IN DEXTROSE 1-5 GM/200ML-% IV SOLN
1000.0000 mg | Freq: Once | INTRAVENOUS | Status: AC
  Administered 2013-04-13: 1000 mg via INTRAVENOUS
  Filled 2013-04-13: qty 200

## 2013-04-13 NOTE — Progress Notes (Signed)
Chaplain responded to consult from nurse. Patient said he would like the chaplain to find contact information for an employee of the hospital. Chaplain said she would see if this was possible. Chaplain consulted HR and social work and was still not able to get a firm answer on whether giving out this information was appropriate. Chaplain returned to patient's room to tell the patient that that she was still working on the issue, but patient stated that he had already figured it out. Patient did not express any other pastoral needs at this time.

## 2013-04-13 NOTE — Progress Notes (Signed)
[facility] PROGRESS NOTE Interim History: 62 y.o. male with HIV, DLBCL on chemotherapy, who presents with SOB, cough, fever, weakness and chest pain. Last chemo was 4 weeks ago. Cough is productive.admitted for HCAp and sepsis.    Assessment/Plan: HCAP (healthcare-associated pneumonia)/ Sepsis: - started on bactrim, vanc and cefepime 9.19.2014. - blood cultures pending, bp improved. - leukopenia and PLT's has decrease. Monitor. - CBC in am. Monitor sat he has a a history of VDRF.     HIV (human immunodeficiency virus infection) - agree at risk for jirovecii, cont bactrim. - renal function stable.  DLBCL (diffuse large B cell lymphoma): - follow up with PCP.    Code Status: Full Family Communication: none  Disposition Plan: inpatient   Consultants:  none  Procedures:  none  Antibiotics:  VAnc cefepime and bactrim 9.19.2014  HPI/Subjective: He is feeling tired. Cont to have a productive cough.  Objective: Filed Vitals:   04/13/13 0243 04/13/13 0300 04/13/13 0330 04/13/13 0342  BP:  109/58 115/57 118/66  Pulse:  82 90 95  Temp:    98.1 F (36.7 C)  TempSrc:    Oral  Resp:    19  Height: 6' 2.02" (1.88 m)   6\' 2"  (1.88 m)  Weight: 78 kg (171 lb 15.3 oz)   74.798 kg (164 lb 14.4 oz)  SpO2:  94%  91%    Intake/Output Summary (Last 24 hours) at 04/13/13 0717 Last data filed at 04/13/13 0708  Gross per 24 hour  Intake      0 ml  Output    525 ml  Net   -525 ml   Filed Weights   04/13/13 0243 04/13/13 0342  Weight: 78 kg (171 lb 15.3 oz) 74.798 kg (164 lb 14.4 oz)    Exam:  General: Alert, awake, oriented x3, in no acute distress.  HEENT: No bruits, no goiter.  Heart: Regular rate and rhythm, without murmurs, rubs, gallops.  Lungs: Good air movement, crackles mainly on right. Abdomen: Soft, nontender, nondistended, positive bowel sounds.     Data Reviewed: Basic Metabolic Panel:  Recent Labs Lab 04/12/13 2230 04/13/13 0542  NA 134*  140  K 4.2 4.2  CL 99 109  CO2 26 24  GLUCOSE 127* 147*  BUN 19 15  CREATININE 0.91 0.77  CALCIUM 8.8 8.8   Liver Function Tests: No results found for this basename: AST, ALT, ALKPHOS, BILITOT, PROT, ALBUMIN,  in the last 168 hours No results found for this basename: LIPASE, AMYLASE,  in the last 168 hours No results found for this basename: AMMONIA,  in the last 168 hours CBC:  Recent Labs Lab 04/12/13 2230 04/13/13 0542  WBC 2.4* 1.4*  HGB 9.1* 8.7*  HCT 26.2* 25.1*  MCV 101.6* 102.9*  PLT 87* 68*   Cardiac Enzymes: No results found for this basename: CKTOTAL, CKMB, CKMBINDEX, TROPONINI,  in the last 168 hours BNP (last 3 results)  Recent Labs  04/12/13 2230  PROBNP 692.3*   CBG: No results found for this basename: GLUCAP,  in the last 168 hours  No results found for this or any previous visit (from the past 240 hour(s)).   Studies: Dg Chest 2 View  04/13/2013   CLINICAL DATA:  Chills, fevers and fatigue.  EXAM: CHEST  2 VIEW  COMPARISON:  CHEST x-ray 10/02/2011.  FINDINGS: Diffuse peribronchial cuffing. Patchy ill-defined opacities suggestive of peribronchovascular ground-glass attenuation airspace disease. No pleural effusions. No evidence of pulmonary edema. Heart size is normal. Mediastinal  contours are unremarkable. Right internal jugular single-lumen porta cath with tip terminating in the distal superior vena cava. Atherosclerosis in the thoracic aorta.  IMPRESSION: 1. The appearance of the chest suggests bronchitis with multilobar bronchopneumonia, as above. 2. Atherosclerosis.   Electronically Signed   By: Trudie Reed M.D.   On: 04/13/2013 01:42    Scheduled Meds: . ceFEPime (MAXIPIME) IV  1 g Intravenous Q8H  . clopidogrel  75 mg Oral Daily  . dolutegravir  50 mg Oral Daily  . emtricitabine-tenofovir  1 tablet Oral Daily  . ezetimibe  10 mg Oral Daily  . gabapentin  300 mg Oral TID  . heparin  5,000 Units Subcutaneous Q8H  . [START ON 04/14/2013]  influenza vac split quadrivalent PF  0.5 mL Intramuscular Tomorrow-1000  . metoprolol succinate  12.5 mg Oral Daily  . pantoprazole  40 mg Oral Daily  . sulfamethoxazole-trimethoprim  320 mg Intravenous Q6H  . valACYclovir  500 mg Oral BID  . vancomycin  1,000 mg Intravenous Q8H   Continuous Infusions: . sodium chloride 125 mL/hr at 04/13/13 0434     Marinda Elk  [facility] Pager [phone]. If 8PM-8AM, please contact night-coverage at [email or web address], password [facility] 04/13/2013, 7:17 AM  LOS: 1 day

## 2013-04-13 NOTE — Progress Notes (Signed)
04/13/13 Pt.A/Ox4 and is ambulatory with one person assist. He arrived from the ED to the unit by stretcher on 2 L [place] oxygen with IV pump. He had no c/o pain or signs of distress. He is receiving scheduled IV antibiotics. Was notified by charge RN around 0730 that lab called the unit to notify of critical WBC. MD was in patient's room at the time and was aware. MD told oncoming RN to place patient on neutropenic precautions.

## 2013-04-13 NOTE — H&P (Signed)
[facility] History and Physical  Elad Macphail WUJ:[id number] DOB: Dec 11, 1950 DOA: 04/12/2013  Referring physician: ED PCP: Benita Gutter, MD   Chief Complaint: SOB  HPI: Troy Holden is a 62 y.o. male with HIV, DLBCL on chemotherapy, who presents with SOB, cough, fever, weakness and chest pain.  Last chemo was 4 weeks ago.  Cough is productive.  Last CD4 count was in may of this year and was 236 (available via [facility] records).  Work up in the ED demonstrates HCAP with sepsis and mild hypotension.  Review of Systems: 12 systems reviewed and otherwise negative.  Past Medical History  Diagnosis Date  . Heart disease   . Cancer     lymphoma  . HIV (human immunodeficiency virus infection)   . Anemia   . Anxiety   . Blood transfusion   . Cataract   . Depression   . GERD (gastroesophageal reflux disease)   . Thyroid disease     low thyroid  . 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 HIV (human immunodeficiency virus infection) 04/20/2011  . Dementia 04/20/2011  . CAD (coronary artery disease) 04/20/2011  . Hypothyroidism 04/20/2011  . Dyslipidemia 04/20/2011  . Hypertension    Past Surgical History  Procedure Laterality Date  . Cardiac surgery    . Abdominal surgery    . Colonoscopy     Social History:  reports that he has been smoking Cigarettes.  He has a 2.5 pack-year smoking history. He does not have any smokeless tobacco history on file. He reports that  drinks alcohol. He reports that he does not use illicit drugs.   Allergies  Allergen Reactions  . Lidocaine Hcl Rash    Patient had to be rushed to hospital from dentist office after receiving xylocaine  . Trazodone And Nefazodone Other (See Comments)    Nightmares, hangover like feeling for 2-3 days    Family History  Problem Relation Age of Onset  . Colon cancer Mother   . Colon cancer Brother     Prior to Admission medications   Medication Sig  Start Date End Date Taking? Authorizing Provider  diazepam (VALIUM) 5 MG tablet Take 5 mg by mouth every 6 (six) hours as needed for anxiety.   Yes Historical Provider, MD  dolutegravir (TIVICAY) 50 MG tablet Take 50 mg by mouth daily.   Yes Historical Provider, MD  emtricitabine-tenofovir (TRUVADA) 200-300 MG per tablet Take 1 tablet by mouth daily.   Yes Historical Provider, MD  ezetimibe (ZETIA) 10 MG tablet Take 10 mg by mouth daily.   Yes Historical Provider, MD  gabapentin (NEURONTIN) 300 MG capsule Take 300 mg by mouth 3 (three) times daily.   Yes Historical Provider, MD  lisinopril (PRINIVIL,ZESTRIL) 5 MG tablet Take 5 mg by mouth daily.   Yes Historical Provider, MD  loperamide (IMODIUM) 2 MG capsule Take 1-2 mg by mouth 4 (four) times daily as needed for diarrhea or loose stools (Take 2 capsules to start then one capsule every 2 hours as needed).   Yes Historical Provider, MD  metoprolol succinate (TOPROL-XL) 25 MG 24 hr tablet Take 12.5 mg by mouth daily.     Yes Historical Provider, MD  naproxen (NAPROSYN) 250 MG tablet Take 250 mg by mouth 2 (two) times daily as needed (for pain).   Yes Historical Provider, MD  nitroGLYCERIN (NITROSTAT) 0.4 MG SL tablet Place 0.4 mg under the tongue every 5 (five) minutes as needed.  Yes Historical Provider, MD  ondansetron (ZOFRAN) 8 MG tablet Take 8 mg by mouth every 8 (eight) hours as needed for nausea.   Yes Historical Provider, MD  pantoprazole (PROTONIX) 40 MG tablet Take 40 mg by mouth daily.   Yes Historical Provider, MD  pravastatin (PRAVACHOL) 40 MG tablet Take 40 mg by mouth daily.   Yes Historical Provider, MD  prochlorperazine (COMPAZINE) 10 MG tablet Take 10 mg by mouth every 6 (six) hours as needed.   Yes Historical Provider, MD  valACYclovir (VALTREX) 500 MG tablet Take 500 mg by mouth 2 (two) times daily.   Yes Historical Provider, MD  zolpidem (AMBIEN) 10 MG tablet Take 10 mg by mouth at bedtime as needed for sleep.   Yes Historical  Provider, MD  clopidogrel (PLAVIX) 75 MG tablet Take 75 mg by mouth daily.      Historical Provider, MD   Physical Exam: Filed Vitals:   04/13/13 0230  BP: 102/59  Pulse: 82  Temp:   Resp:     General:  NAD, resting comfortably in bed Eyes: PEERLA EOMI ENT: mucous membranes moist Neck: supple w/o JVD Cardiovascular: RRR w/o MRG Respiratory: CTA B, Desaturating to 88% on RA Abdomen: soft, nt, nd, bs+ Skin: no rash nor lesion Musculoskeletal: MAE, full ROM all 4 extremities Psychiatric: normal tone and affect Neurologic: AAOx3, grossly non-focal   Labs on Admission:  Basic Metabolic Panel:  Recent Labs Lab 04/12/13 2230  NA 134*  K 4.2  CL 99  CO2 26  GLUCOSE 127*  BUN 19  CREATININE 0.91  CALCIUM 8.8   Liver Function Tests: No results found for this basename: AST, ALT, ALKPHOS, BILITOT, PROT, ALBUMIN,  in the last 168 hours No results found for this basename: LIPASE, AMYLASE,  in the last 168 hours No results found for this basename: AMMONIA,  in the last 168 hours CBC:  Recent Labs Lab 04/12/13 2230  WBC 2.4*  HGB 9.1*  HCT 26.2*  MCV 101.6*  PLT 87*   Cardiac Enzymes: No results found for this basename: CKTOTAL, CKMB, CKMBINDEX, TROPONINI,  in the last 168 hours  BNP (last 3 results)  Recent Labs  04/12/13 2230  PROBNP 692.3*   CBG: No results found for this basename: GLUCAP,  in the last 168 hours  Radiological Exams on Admission: Dg Chest 2 View  04/13/2013   CLINICAL DATA:  Chills, fevers and fatigue.  EXAM: CHEST  2 VIEW  COMPARISON:  CHEST x-ray 10/02/2011.  FINDINGS: Diffuse peribronchial cuffing. Patchy ill-defined opacities suggestive of peribronchovascular ground-glass attenuation airspace disease. No pleural effusions. No evidence of pulmonary edema. Heart size is normal. Mediastinal contours are unremarkable. Right internal jugular single-lumen porta cath with tip terminating in the distal superior vena cava. Atherosclerosis in the  thoracic aorta.  IMPRESSION: 1. The appearance of the chest suggests bronchitis with multilobar bronchopneumonia, as above. 2. Atherosclerosis.   Electronically Signed   By: Trudie Reed M.D.   On: 04/13/2013 01:42    EKG: Independently reviewed.  Assessment/Plan Principal Problem:   HCAP (healthcare-associated pneumonia) Active Problems:   DLBCL (diffuse large B cell lymphoma)   HIV (human immunodeficiency virus infection)   Sepsis   1. HCAP - with recent hospital stays at [facility], treating with HCAP coverage, also got a dose of bactrim while a repeat of his CD4 count is pending but last CD4 count was 236, he has since been on chemotherapy however so this may have dropped thus putting him at risk  for PJP.  CXR was diffuse bilateral PNA with ground glass appearance so could be either HCAP with typical organism or PJP as possibility. 2. Sepsis - getting IVF resuscitation in ED, then giving patient NS at 125 cc/hr, holding home BP meds due to hypotension, does not appear frankly toxic    Code Status: Full Code (must indicate code status--if unknown or must be presumed, indicate so) Family Communication: No family in room (indicate person spoken with, if applicable, with phone number if by telephone) Disposition Plan: Admit to inpatient (indicate anticipated LOS)  Time spent: 70 min  Audi Wettstein M. [facility] Pager [phone]  If 7PM-7AM, please contact night-coverage [email or web address] Password [email or web address] 04/13/2013, 2:52 AM

## 2013-04-13 NOTE — Progress Notes (Signed)
INITIAL NUTRITION ASSESSMENT  DOCUMENTATION CODES Per approved criteria  -Not Applicable   INTERVENTION: Provide Ensure Complete 4 times daily Provide Multivitamin with minerals daily  NUTRITION DIAGNOSIS: Increased nutrient needs related to cachetic medical conditions  as evidenced by pt with cancer and HIV.   Goal: Pt to meet >/= 90% of their estimated nutrition needs   Monitor:  PO intake Weight Labs  Reason for Assessment: Malnutrition Screening Tool, score of 3  62 y.o. male  Admitting Dx: HCAP (healthcare-associated pneumonia)  ASSESSMENT: 62 y.o. male with HIV, DLBCL on chemotherapy, who presents with SOB, cough, fever, weakness and chest pain. Last chemo was 4 weeks ago. Work up in the ED demonstrates HCAP with sepsis and mild hypotension. Pt reports that his appetite varies and he eats when he feels hungry and doesn't eat if he doesn't feel hungry. Pt reports that his oncologist recommended he drink Ensure supplements 4 times daily. He reports his usual body weight is 185 lbs which he last remembers weighing several months ago. Pt on clear liquid diet this morning- consumed 25% of breakfast tray. Diet now advanced to regular.   Nutrition Focused Physical Exam:  Subcutaneous Fat:  Orbital Region: wnl Upper Arm Region: mild/moderate wasting Thoracic and Lumbar Region: NA  Muscle:  Temple Region: mild/moderate wasting Clavicle Bone Region: mild/moderate wasting Clavicle and Acromion Bone Region: mild/moderate wasting Scapular Bone Region: NA Dorsal Hand: wnl Patellar Region: wnl Anterior Thigh Region: mild wasting Posterior Calf Region: wnl  Edema: none  Height: Ht Readings from Last 1 Encounters:  04/13/13 6\' 2"  (1.88 m)    Weight: Wt Readings from Last 1 Encounters:  04/13/13 164 lb 14.4 oz (74.798 kg)    Ideal Body Weight: 190 lbs  % Ideal Body Weight: 86%  Wt Readings from Last 10 Encounters:  04/13/13 164 lb 14.4 oz (74.798 kg)  01/02/13  171 lb (77.565 kg)  10/03/11 182 lb 12.2 oz (82.9 kg)  09/16/11 185 lb (83.915 kg)  09/13/11 185 lb (83.915 kg)  04/20/11 197 lb (89.359 kg)  03/30/11 190 lb (86.183 kg)  03/27/11 192 lb (87.091 kg)  03/26/11 192 lb (87.091 kg)    Usual Body Weight: 185 lbs  % Usual Body Weight: 89%  BMI:  Body mass index is 21.16 kg/(m^2).  Estimated Nutritional Needs: Kcal: 2300-2600 Protein: 140-150 grams Fluid: 2.3- 2.6 L/day  Skin: intact  Diet Order: General  EDUCATION NEEDS: -No education needs identified at this time   Intake/Output Summary (Last 24 hours) at 04/13/13 1329 Last data filed at 04/13/13 0923  Gross per 24 hour  Intake 1010.83 ml  Output    825 ml  Net 185.83 ml    Last BM: 9/18   Labs:   Recent Labs Lab 04/12/13 2230 04/13/13 0542  NA 134* 140  K 4.2 4.2  CL 99 109  CO2 26 24  BUN 19 15  CREATININE 0.91 0.77  CALCIUM 8.8 8.8  GLUCOSE 127* 147*    CBG (last 3)  No results found for this basename: GLUCAP,  in the last 72 hours  Scheduled Meds: . ceFEPime (MAXIPIME) IV  1 g Intravenous Q8H  . clopidogrel  75 mg Oral Daily  . dolutegravir  50 mg Oral Daily  . emtricitabine-tenofovir  1 tablet Oral Daily  . ezetimibe  10 mg Oral Daily  . gabapentin  300 mg Oral TID  . heparin  5,000 Units Subcutaneous Q8H  . [START ON 04/14/2013] influenza vac split quadrivalent PF  0.5  mL Intramuscular Tomorrow-1000  . metoprolol succinate  12.5 mg Oral Daily  . pantoprazole  40 mg Oral Daily  . sulfamethoxazole-trimethoprim  320 mg Intravenous Q6H  . valACYclovir  500 mg Oral BID  . vancomycin  1,000 mg Intravenous Q8H    Continuous Infusions: . sodium chloride 125 mL/hr at 04/13/13 1154    Past Medical History  Diagnosis Date  . Heart disease   . Cancer     lymphoma  . HIV (human immunodeficiency virus infection)   . Anemia   . Anxiety   . Blood transfusion   . Cataract   . Depression   . GERD (gastroesophageal reflux disease)   . Thyroid  disease     low thyroid  . 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 HIV (human immunodeficiency virus infection) 04/20/2011  . Dementia 04/20/2011  . CAD (coronary artery disease) 04/20/2011  . Hypothyroidism 04/20/2011  . Dyslipidemia 04/20/2011  . Hypertension   . Dysrhythmia   . Shortness of breath   . Pneumonia   . Hepatitis     Past Surgical History  Procedure Laterality Date  . Cardiac surgery    . Abdominal surgery    . Colonoscopy      Ian Malkin RD, LDN Inpatient Clinical Dietitian Pager: [phone] After Hours Pager: [phone]

## 2013-04-13 NOTE — ED Provider Notes (Signed)
CSN: [id number]     Arrival date & time 04/12/13  2141 History   First MD Initiated Contact with Patient 04/12/13 2334     Chief Complaint  Patient presents with  . Cough  . Dehydration  . Weakness  . Chest Pain  . Nausea   (Consider location/radiation/quality/duration/timing/severity/associated sxs/prior Treatment) HPI  Patient is a very pleasant 62 yo man who is 4 wks s/p  two rounds of continuous 5 day infusion of chemotherapy for rectal carcinoma and is HIV positive. He presents with complaints of several days of of a productive cough with intermittent fever and chills. He feels SOB and says his sx are not improving. He feels a diffuse chest tightness. He feels lightheaded and near syncopal when he stands.   His UOP has been wnl. He denies vomiting but, endorses diarrhea - but states this has been a chronic problem and is actually improving. Denies any recent medication changes and reports compliance with medical regimen.   The patient receives his primary medical care and oncology care at [facility]. However, he lives in [place].    Past Medical History  Diagnosis Date  . Heart disease   . Cancer     lymphoma  . HIV (human immunodeficiency virus infection)   . Anemia   . Anxiety   . Blood transfusion   . Cataract   . Depression   . GERD (gastroesophageal reflux disease)   . Thyroid disease     low thyroid  . 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 HIV (human immunodeficiency virus infection) 04/20/2011  . Dementia 04/20/2011  . CAD (coronary artery disease) 04/20/2011  . Hypothyroidism 04/20/2011  . Dyslipidemia 04/20/2011  . Hypertension    Past Surgical History  Procedure Laterality Date  . Cardiac surgery    . Abdominal surgery    . Colonoscopy     Family History  Problem Relation Age of Onset  . Colon cancer Mother   . Colon cancer Brother    History  Substance Use Topics  . Smoking status: Current  Some Day Smoker -- 0.25 packs/day for 10 years    Types: Cigarettes    Last Attempt to Quit: 03/30/2011  . Smokeless tobacco: Not on file  . Alcohol Use: Yes     Comment: rarely    Review of Systems Gen: As per history of present illness, otherwise negative Eyes: Purulent conjunctival discharge x2 days, no visual changes Nose: no epistaxis or rhinorrhea Mouth: no dental pain, no sore throat Neck: no neck pain Lungs: As per history of present illness, otherwise negative CV: As per history of present illness, otherwise negative Abd: no abdominal pain, nausea, vomiting GU: no dysuria or gross hematuria MSK: no myalgias or arthralgias Neuro: no headache, no focal neurologic deficits Skin: no rash Psyche: negative.  Allergies  Lidocaine hcl and Trazodone and nefazodone  Home Medications   Current Outpatient Rx  Name  Route  Sig  Dispense  Refill  . clopidogrel (PLAVIX) 75 MG tablet   Oral   Take 75 mg by mouth daily.           . diazepam (VALIUM) 5 MG tablet   Oral   Take 5 mg by mouth every 6 (six) hours as needed for anxiety.         . dolutegravir (TIVICAY) 50 MG tablet   Oral   Take 50 mg by mouth daily.         Marland Kitchen  emtricitabine-tenofovir (TRUVADA) 200-300 MG per tablet   Oral   Take 1 tablet by mouth daily.         Marland Kitchen ezetimibe (ZETIA) 10 MG tablet   Oral   Take 10 mg by mouth daily.         Marland Kitchen gabapentin (NEURONTIN) 300 MG capsule   Oral   Take 300 mg by mouth 3 (three) times daily.         Marland Kitchen levothyroxine (SYNTHROID, LEVOTHROID) 112 MCG tablet   Oral   Take 112 mcg by mouth daily.           Marland Kitchen lisinopril (PRINIVIL,ZESTRIL) 10 MG tablet   Oral   Take 10 mg by mouth daily.         . metoprolol succinate (TOPROL-XL) 25 MG 24 hr tablet   Oral   Take 12.5 mg by mouth daily.           . nitroGLYCERIN (NITROSTAT) 0.4 MG SL tablet   Sublingual   Place 0.4 mg under the tongue every 5 (five) minutes as needed.           Marland Kitchen oxyCODONE (OXY  IR/ROXICODONE) 5 MG immediate release tablet   Oral   Take 5 mg by mouth every 4 (four) hours as needed for pain.         Marland Kitchen EXPIRED: pantoprazole (PROTONIX) 40 MG tablet   Oral   Take 1 tablet (40 mg total) by mouth daily at 12 noon.   30 tablet   0   . pantoprazole (PROTONIX) 40 MG tablet   Oral   Take 40 mg by mouth daily.         Marland Kitchen sulfamethoxazole-trimethoprim (SEPTRA DS) 800-160 MG per tablet   Oral   Take 1 tablet by mouth 2 (two) times daily.   28 tablet   0   . valACYclovir (VALTREX) 500 MG tablet   Oral   Take 500 mg by mouth 2 (two) times daily.         Marland Kitchen zolpidem (AMBIEN) 10 MG tablet   Oral   Take 10 mg by mouth at bedtime as needed for sleep.          BP 90/69  Pulse 100  Temp(Src) 100 F (37.8 C) (Oral)  Resp 20  SpO2 92% Physical Exam Gen: well developed but chronically ill appearing and thin Head: NCAT Eyes: PERL, EOMI, conjunctiva are injected and there is purulent discharge bilaterally. Nose: no epistaixis or rhinorrhea Mouth/throat: mucosa is mildly dehydrated appearing and pink Neck: supple, no stridor Lungs: Respiratory rate 24 times per minute, sats 88% on room air, faint bibasilar rhonchi, no wheezing, accessory muscle use. CV: Regular rate and rhythm with holosystolic murmur, extremities well perfused Abd: soft, notender, nondistended Back: no ttp, no cva ttp Skin: Warm and dry, no rash appreciated Extremities: No edema Neuro: CN ii-xii grossly intact, no focal deficits Psyche; normal affect,  calm and cooperative.   ED Course  Procedures (including critical care time)  Lab review:  Results for orders placed during the hospital encounter of 04/12/13 (from the past 24 hour(s))  CBC     Status: Abnormal   Collection Time    04/12/13 10:30 PM      Result Value Range   WBC 2.4 (*) 4.0 - 10.5 K/uL   RBC 2.58 (*) 4.22 - 5.81 MIL/uL   Hemoglobin 9.1 (*) 13.0 - 17.0 g/dL   HCT 40.9 (*) 81.1 - 91.4 %   MCV 101.6 (*)  78.0 - 100.0  fL   MCH 35.3 (*) 26.0 - 34.0 pg   MCHC 34.7  30.0 - 36.0 g/dL   RDW 40.9 (*) 81.1 - 91.4 %   Platelets 87 (*) 150 - 400 K/uL  BASIC METABOLIC PANEL     Status: Abnormal   Collection Time    04/12/13 10:30 PM      Result Value Range   Sodium 134 (*) 135 - 145 mEq/L   Potassium 4.2  3.5 - 5.1 mEq/L   Chloride 99  96 - 112 mEq/L   CO2 26  19 - 32 mEq/L   Glucose, Bld 127 (*) 70 - 99 mg/dL   BUN 19  6 - 23 mg/dL   Creatinine, Ser 7.82  0.50 - 1.35 mg/dL   Calcium 8.8  8.4 - 95.6 mg/dL   GFR calc non Af Amer >90  >90 mL/min   GFR calc Af Amer >90  >90 mL/min  PRO B NATRIURETIC PEPTIDE     Status: Abnormal   Collection Time    04/12/13 10:30 PM      Result Value Range   Pro B Natriuretic peptide (BNP) 692.3 (*) 0 - 125 pg/mL  CG4 I-STAT (LACTIC ACID)     Status: None   Collection Time    04/12/13 10:41 PM      Result Value Range   Lactic Acid, Venous 0.83  0.5 - 2.2 mmol/L  POCT I-STAT TROPONIN I     Status: None   Collection Time    04/12/13 10:41 PM      Result Value Range   Troponin i, poc 0.01  0.00 - 0.08 ng/mL   Comment 3            EKG: sinus tach, RBBB, normal intervals, normal axis, no acute ischemic changes.   CXR: bibasilar infiltrates - right > left.   MDM  This is a significantly immunocompromised patient who presents with sepsis and hypoxemia with borderline hypotension. I suspect his he has HCAP with associated dehydration. He is noted to be pancytopenic with WBC of 2.4, Hgb of 9 and platelets of 87K. His last CBC was 03/05/13 at [facility]. At that time, WBC was 3.0, Hgb 12 and platelets 180K.    We will tx empirically with Vanc and Natasha Bence and resuscitate aggressively with IVF. Will check CD4 count, request records from [facility] and manage supportively. Retic count is pending. Patient will require admission to SDU v. Tele Unit.   0136: Patient with confirmed bibasilar infiltrates on CXR. Feeling better with IVF and supplemental 02, hospitalist paged to request  admission.   CRITICAL CARE Performed by: Brandt Loosen   Total critical care time: 56m  Critical care time was exclusive of separately billable procedures and treating other patients.  Critical care was necessary to treat or prevent imminent or life-threatening deterioration.  Critical care was time spent personally by me on the following activities: development of treatment plan with patient and/or surrogate as well as nursing, discussions with consultants, evaluation of patient's response to treatment, examination of patient, obtaining history from patient or surrogate, ordering and performing treatments and interventions, ordering and review of laboratory studies, ordering and review of radiographic studies, pulse oximetry and re-evaluation of patient's condition.     Brandt Loosen, MD 04/13/13 [phone]

## 2013-04-13 NOTE — Progress Notes (Signed)
ANTIBIOTIC CONSULT NOTE - INITIAL  Pharmacy Consult for Vancomycin/Septra Indication: pneumonia  Allergies  Allergen Reactions  . Lidocaine Hcl Rash    Patient had to be rushed to hospital from dentist office after receiving xylocaine  . Trazodone And Nefazodone Other (See Comments)    Nightmares, hangover like feeling for 2-3 days    Patient Measurements: Height: 6' 2.02" (188 cm) Weight: 171 lb 15.3 oz (78 kg) (12/2012) IBW/kg (Calculated) : 82.24  Vital Signs: Temp: 100 F (37.8 C) (09/18 2219) Temp src: Oral (09/18 2219) BP: 102/59 mmHg (09/19 0230) Pulse Rate: 82 (09/19 0230) Intake/Output from previous day:   Intake/Output from this shift:    Labs:  Recent Labs  04/12/13 2230  WBC 2.4*  HGB 9.1*  PLT 87*  CREATININE 0.91   Estimated Creatinine Clearance: 95.2 ml/min (by C-G formula based on Cr of 0.91). No results found for this basename: VANCOTROUGH, VANCOPEAK, VANCORANDOM, GENTTROUGH, GENTPEAK, GENTRANDOM, TOBRATROUGH, TOBRAPEAK, TOBRARND, AMIKACINPEAK, AMIKACINTROU, AMIKACIN,  in the last 72 hours   Microbiology: No results found for this or any previous visit (from the past 720 hour(s)).  Medical History: Past Medical History  Diagnosis Date  . Heart disease   . Cancer     lymphoma  . HIV (human immunodeficiency virus infection)   . Anemia   . Anxiety   . Blood transfusion   . Cataract   . Depression   . GERD (gastroesophageal reflux disease)   . Thyroid disease     low thyroid  . 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 HIV (human immunodeficiency virus infection) 04/20/2011  . Dementia 04/20/2011  . CAD (coronary artery disease) 04/20/2011  . Hypothyroidism 04/20/2011  . Dyslipidemia 04/20/2011  . Hypertension     Medications:  Plavix  Valium  Zetia  Neurontin  Zestril  Immodium  Toprol XL  Ntg  Protonix  Pravachol  Valtrex  Ambien  Tivicay  Truvada    Assessment: 62 yo male with  PNA for empiric antibiotics.  Vancomycin 1 g IV given in ED at 0130  Goal of Therapy:  Vancomycin trough level 15-20 mcg/ml  Plan:  Vancomycin 1 g IV q8h Septra 320 mg IV q6h F/U CD4 count  Lashica Hannay, Gary Fleet 04/13/2013,2:53 AM

## 2013-04-13 NOTE — Progress Notes (Signed)
Utilization Review Completed.   Catrena Vari, RN, BSN Nurse Case Manager  [phone]  

## 2013-04-13 NOTE — ED Notes (Signed)
Pt denies being nauseous at this time and declines need for nausea medication.

## 2013-04-14 LAB — EXPECTORATED SPUTUM ASSESSMENT W GRAM STAIN, RFLX TO RESP C

## 2013-04-14 MED ORDER — SODIUM CHLORIDE 0.9 % IJ SOLN: 10.0000 mL | INTRAMUSCULAR | Status: DC | PRN

## 2013-04-14 MED ORDER — SULFAMETHOXAZOLE-TMP DS 800-160 MG PO TABS
1.0000 | ORAL_TABLET | Freq: Two times a day (BID) | ORAL | Status: DC
  Administered 2013-04-14: 21:00:00 1 via ORAL
  Filled 2013-04-14 (×3): qty 1

## 2013-04-14 MED ORDER — SIMVASTATIN 10 MG PO TABS
10.0000 mg | ORAL_TABLET | Freq: Every day | ORAL | Status: DC
  Administered 2013-04-14 – 2013-04-16 (×3): 10 mg via ORAL
  Filled 2013-04-14 (×4): qty 1

## 2013-04-14 MED ORDER — SULFAMETHOXAZOLE-TMP DS 800-160 MG PO TABS
1.0000 | ORAL_TABLET | Freq: Two times a day (BID) | ORAL | Status: DC
  Filled 2013-04-14 (×2): qty 1

## 2013-04-14 MED ORDER — PREDNISONE 20 MG PO TABS
40.0000 mg | ORAL_TABLET | Freq: Two times a day (BID) | ORAL | Status: DC
  Administered 2013-04-14 – 2013-04-15 (×2): 40 mg via ORAL
  Filled 2013-04-14 (×4): qty 2

## 2013-04-14 NOTE — Consult Note (Signed)
[facility]    Date of Admission:  04/12/2013  Date of Consult:  04/14/2013  Reason for Consult: HCAP vs PCP  Referring Physician: Dr. Robb Matar   HPI: Troy Holden is an 62 y.o. 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 Last infusion mid August, complicated by profound fatigue, occasional diarrhea, anorexia, dehydration and weight loss (20 pounds). Had port placed right subclavian July 11th. Had  Occasional night sweats during chemotherapy. He has had low CD4 due to chemotherapy. He had developed worsening SOB, cough, fever, weakness chest heaviness. Has had productive cough. CXR in the ED shows multifocal infiltrates. He is currently on HCAP therapy with vancomycin and bactrim but only one DS bid rather than rx dose at Two tablets TID. His LDH is normal . PCP is listed as not havng been collected in epic.He has improved on therapy overnight.    Past Medical History  Diagnosis Date  . Heart disease   . Cancer     lymphoma  . HIV (human immunodeficiency virus infection)   . Anemia   . Anxiety   . Blood transfusion   . Cataract   . Depression   . GERD (gastroesophageal reflux disease)   . Thyroid disease     low thyroid  . 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 HIV (human immunodeficiency virus infection) 04/20/2011  . Dementia 04/20/2011  . CAD (coronary artery disease) 04/20/2011  . Hypothyroidism 04/20/2011  . Dyslipidemia 04/20/2011  . Hypertension   . Dysrhythmia   . Shortness of breath   . Pneumonia   . Hepatitis     Past Surgical History  Procedure Laterality Date  . Cardiac surgery    . Abdominal surgery    . Colonoscopy    ergies:   Allergies  Allergen Reactions  . Lidocaine Hcl Rash   Patient had to be rushed to hospital from dentist office after receiving xylocaine  . Trazodone And Nefazodone Other (See Comments)    Nightmares, hangover like feeling for 2-3 days     Medications: I have reviewed patients current medications as documented in Epic Anti-infectives   Start     Dose/Rate Route Frequency Ordered Stop   04/14/13 2200  sulfamethoxazole-trimethoprim (BACTRIM DS) 800-160 MG per tablet 1 tablet     1 tablet Oral Every 12 hours 04/14/13 0917     04/14/13 1000  sulfamethoxazole-trimethoprim (BACTRIM DS) 800-160 MG per tablet 1 tablet  Status:  Discontinued     1 tablet Oral Every 12 hours 04/14/13 0854 04/14/13 0917   04/13/13 1000  dolutegravir (TIVICAY) tablet 50 mg     50 mg Oral Daily 04/13/13 0251     04/13/13 1000  emtricitabine-tenofovir (TRUVADA) 200-300 MG per tablet 1 tablet     1 tablet Oral Daily 04/13/13 0251     04/13/13 1000  valACYclovir (VALTREX) tablet 500 mg     500 mg Oral 2 times daily 04/13/13 0251     04/13/13 0600  ceFEPIme (MAXIPIME) 1 g in dextrose 5 % 50 mL IVPB     1 g 100 mL/hr over 30 Minutes Intravenous 3 times per day 04/13/13 0251 04/21/13 0559   04/13/13 0600  vancomycin (VANCOCIN) IVPB 1000 mg/200 mL premix  1,000 mg 200 mL/hr over 60 Minutes Intravenous Every 8 hours 04/13/13 0305     04/13/13 0600  sulfamethoxazole-trimethoprim (BACTRIM) 320 mg in dextrose 5 % 500 mL IVPB  Status:  Discontinued     320 mg 346.7 mL/hr over 90 Minutes Intravenous 4 times per day 04/13/13 0305 04/14/13 0854   04/13/13 0200  sulfamethoxazole-trimethoprim (BACTRIM DS) 800-160 MG per tablet 1 tablet     1 tablet Oral  Once 04/13/13 0152 04/13/13 0234   04/13/13 0030  piperacillin-tazobactam (ZOSYN) IVPB 3.375 g     3.375 g 100 mL/hr over 30 Minutes Intravenous  Once 04/13/13 0028 04/13/13 0128   04/13/13 0030  vancomycin (VANCOCIN) IVPB 1000 mg/200 mL premix     1,000 mg 200 mL/hr over 60 Minutes Intravenous  Once 04/13/13 0028 04/13/13 0245       Social History:  reports that he has been smoking Cigarettes.  He has a 2.5 pack-year smoking history. He does not have any smokeless tobacco history on file. He reports that  drinks alcohol. He reports that he does not use illicit drugs.  Family History  Problem Relation Age of Onset  . Colon cancer Mother   . Colon cancer Brother     As in HPI and primary teams notes otherwise 12 point review of systems is negative  Blood pressure 90/42, pulse 71, temperature 98.1 F (36.7 C), temperature source Oral, resp. rate 19, height 6\' 2"  (1.88 m), weight 164 lb 14.4 oz (74.798 kg), SpO2 97.00%. General: Alert and awake, oriented x3, fatigued appearing HEENT: anicteric sclera, , EOMI, oropharynx clear and without exudate CVS tachy rate, normal r,  no murmur rubs or gallops Chest: rhonhi at the bases Abdomen: soft nontender, nondistended, normal bowel sounds, Extremities: no  clubbing or edema noted bilaterally Skin: no rashes, porta cath clean Neuro: nonfocal, strength and sensation intact       Component Value Date/Time   SDES URINE, CLEAN CATCH 04/13/2013 0354   SPECREQUEST NONE 04/13/2013 0354   CULT  Value:        BLOOD CULTURE RECEIVED NO GROWTH TO DATE CULTURE WILL BE HELD FOR 5 DAYS BEFORE ISSUING A FINAL NEGATIVE REPORT Performed at [facility] 04/13/2013 0320   REPTSTATUS 04/13/2013 FINAL 04/13/2013 0354   Dg Chest 2 View  04/13/2013   CLINICAL DATA:  Chills, fevers and fatigue.  EXAM: CHEST  2 VIEW  COMPARISON:  CHEST x-ray 10/02/2011.  FINDINGS: Diffuse peribronchial cuffing. Patchy ill-defined opacities suggestive of peribronchovascular ground-glass attenuation airspace disease. No pleural effusions. No evidence of pulmonary edema. Heart size is normal. Mediastinal contours are unremarkable. Right internal jugular single-lumen porta cath with tip terminating in the distal superior vena cava. Atherosclerosis in the thoracic aorta.  IMPRESSION: 1. The appearance of the  chest suggests bronchitis with multilobar bronchopneumonia, as above. 2. Atherosclerosis.   Electronically Signed   By: Trudie Reed M.D.   On: 04/13/2013 01:42     Recent Results (from the past 720 hour(s))  CULTURE, BLOOD (ROUTINE X 2)     Status: None   Collection Time    04/13/13  3:15 AM      Result Value Range Status   Specimen Description BLOOD RIGHT ARM   Final   Special Requests BOTTLES DRAWN AEROBIC ONLY 10CC   Final   Culture  Setup Time     Final   Value: 04/13/2013 09:48     Performed at [facility]  Final   Value:        BLOOD CULTURE RECEIVED NO GROWTH TO DATE CULTURE WILL BE HELD FOR 5 DAYS BEFORE ISSUING A FINAL NEGATIVE REPORT     Performed at [facility]   Report Status PENDING   Incomplete  CULTURE, BLOOD (ROUTINE X 2)     Status: None   Collection Time    04/13/13  3:20 AM      Result Value Range Status   Specimen Description BLOOD RIGHT HAND   Final   Special Requests BOTTLES DRAWN AEROBIC ONLY 10CC   Final   Culture  Setup Time     Final   Value: 04/13/2013 09:47     Performed at [facility]   Culture     Final   Value:        BLOOD CULTURE RECEIVED NO GROWTH TO DATE CULTURE WILL BE HELD FOR 5 DAYS BEFORE ISSUING A FINAL NEGATIVE REPORT     Performed at [facility]   Report Status PENDING   Incomplete     Impression/Recommendation:  62 yo with HIV (well controlled ) but with Diffuse large B cell lymphoma treated and now with 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 Will also let Dr. Lurlean Leyden know of his admission  #3 Rectal squamous cell ca: sp last chemo mid August.      Thank you so much for this interesting consult  [facility] [phone] (pager) [phone] (office) 04/14/2013, 3:46 PM  Paulette Blanch Dam 04/14/2013, 3:46 PM

## 2013-04-14 NOTE — Progress Notes (Signed)
[facility] PROGRESS NOTE Interim History: 61 y.o. male with HIV, DLBCL on chemotherapy, who presents with SOB, cough, fever, weakness and chest pain. Last chemo was 4 weeks ago. Cough is productive.admitted for HCAp and sepsis.    Assessment/Plan: HCAP (healthcare-associated pneumonia)/ Sepsis: - Bactrim, vanc and cefepime 9.19.2014. - leukopenia and PLT's has decrease. Monitor. - CBC in am. PCP smear pending. - at risk for PCP PNA on bactrim. - sputum cultuture pending     HIV (human immunodeficiency virus infection) - agree at risk for jirovecii, cont bactrim. - renal function stable.  DLBCL (diffuse large B cell lymphoma): - follow up with PCP.    Code Status: Full Family Communication: none  Disposition Plan: inpatient   Consultants:  none  Procedures:  none  Antibiotics:  VAnc cefepime and bactrim 9.19.2014  HPI/Subjective: Angry as could not get any sleep. Does feel better  Objective: Filed Vitals:   04/13/13 0342 04/13/13 1015 04/13/13 1535 04/13/13 2318  BP: 118/66 105/64 100/59   Pulse: 95 96 75 79  Temp: 98.1 F (36.7 C)  98.1 F (36.7 C) 97.4 F (36.3 C)  TempSrc: Oral  Oral Oral  Resp: 19 18 18 18   Height: 6\' 2"  (1.88 m)     Weight: 74.798 kg (164 lb 14.4 oz)     SpO2: 91% 95% 99% 95%    Intake/Output Summary (Last 24 hours) at 04/14/13 0853 Last data filed at 04/14/13 0800  Gross per 24 hour  Intake 7073.33 ml  Output   2825 ml  Net 4248.33 ml   Filed Weights   04/13/13 0243 04/13/13 0342  Weight: 78 kg (171 lb 15.3 oz) 74.798 kg (164 lb 14.4 oz)    Exam:  General: Alert, awake, oriented x3, in no acute distress.  HEENT: No bruits, no goiter.  Heart: Regular rate and rhythm, without murmurs, rubs, gallops.  Lungs: Good air movement, crackles mainly on right. Abdomen: Soft, nontender, nondistended, positive bowel sounds.     Data Reviewed: Basic Metabolic Panel:  Recent Labs Lab 04/12/13 2230 04/13/13 0542   NA 134* 140  K 4.2 4.2  CL 99 109  CO2 26 24  GLUCOSE 127* 147*  BUN 19 15  CREATININE 0.91 0.77  CALCIUM 8.8 8.8   Liver Function Tests: No results found for this basename: AST, ALT, ALKPHOS, BILITOT, PROT, ALBUMIN,  in the last 168 hours No results found for this basename: LIPASE, AMYLASE,  in the last 168 hours No results found for this basename: AMMONIA,  in the last 168 hours CBC:  Recent Labs Lab 04/12/13 2230 04/13/13 0542  WBC 2.4* 1.4*  HGB 9.1* 8.7*  HCT 26.2* 25.1*  MCV 101.6* 102.9*  PLT 87* 68*   Cardiac Enzymes: No results found for this basename: CKTOTAL, CKMB, CKMBINDEX, TROPONINI,  in the last 168 hours BNP (last 3 results)  Recent Labs  04/12/13 2230  PROBNP 692.3*   CBG: No results found for this basename: GLUCAP,  in the last 168 hours  No results found for this or any previous visit (from the past 240 hour(s)).   Studies: Dg Chest 2 View  04/13/2013   CLINICAL DATA:  Chills, fevers and fatigue.  EXAM: CHEST  2 VIEW  COMPARISON:  CHEST x-ray 10/02/2011.  FINDINGS: Diffuse peribronchial cuffing. Patchy ill-defined opacities suggestive of peribronchovascular ground-glass attenuation airspace disease. No pleural effusions. No evidence of pulmonary edema. Heart size is normal. Mediastinal contours are unremarkable. Right internal jugular single-lumen porta cath with tip terminating  in the distal superior vena cava. Atherosclerosis in the thoracic aorta.  IMPRESSION: 1. The appearance of the chest suggests bronchitis with multilobar bronchopneumonia, as above. 2. Atherosclerosis.   Electronically Signed   By: Trudie Reed M.D.   On: 04/13/2013 01:42    Scheduled Meds: . ceFEPime (MAXIPIME) IV  1 g Intravenous Q8H  . clopidogrel  75 mg Oral Daily  . dolutegravir  50 mg Oral Daily  . emtricitabine-tenofovir  1 tablet Oral Daily  . ezetimibe  10 mg Oral Daily  . feeding supplement  237 mL Oral TID PC & HS  . gabapentin  300 mg Oral TID  . heparin   5,000 Units Subcutaneous Q8H  . influenza vac split quadrivalent PF  0.5 mL Intramuscular Tomorrow-1000  . metoprolol succinate  12.5 mg Oral Daily  . multivitamin with minerals  1 tablet Oral Daily  . pantoprazole  40 mg Oral Daily  . sulfamethoxazole-trimethoprim  320 mg Intravenous Q6H  . valACYclovir  500 mg Oral BID  . vancomycin  1,000 mg Intravenous Q8H   Continuous Infusions: . sodium chloride 125 mL/hr at 04/14/13 0540     Marinda Elk  [facility] Pager [phone]. If 8PM-8AM, please contact night-coverage at [email or web address], password [facility] 04/14/2013, 8:53 AM  LOS: 2 days

## 2013-04-14 NOTE — Progress Notes (Signed)
Pt alert and oriented times 4, very upset for not being able to rest at night, MD spoke with patient and was able to calm patient, notes placed on door per patient, ambulating in room without assist, family and friends at bedside, will continue to monitor.

## 2013-04-15 LAB — CBC
HCT: 23.1 % — ABNORMAL LOW (ref 39.0–52.0)
Hemoglobin: 7.9 g/dL — ABNORMAL LOW (ref 13.0–17.0)
MCH: 35.4 pg — ABNORMAL HIGH (ref 26.0–34.0)
MCHC: 34.2 g/dL (ref 30.0–36.0)
MCV: 103.6 fL — ABNORMAL HIGH (ref 78.0–100.0)
RDW: 22.2 % — ABNORMAL HIGH (ref 11.5–15.5)
WBC: 2 10*3/uL — ABNORMAL LOW (ref 4.0–10.5)

## 2013-04-15 NOTE — Progress Notes (Signed)
Pt alert and oriented times 4, ambulating in room independently, able to communicate needs, family and friends at bedside, NAD noted, will continue to monitor. Continues on NS at 115mls/hr, IV abx given, tolerating well.

## 2013-04-15 NOTE — Progress Notes (Signed)
[facility] PROGRESS NOTE Interim History: 62 y.o. male with HIV, DLBCL on chemotherapy, who presents with SOB, cough, fever, weakness and chest pain. Last chemo was 4 weeks ago. Cough is productive.admitted for HCAp and sepsis.    Assessment/Plan: HCAP (healthcare-associated pneumonia)/ Sepsis: - Bactrim, vanc and cefepime 9.19.2014. - leukopenia improving as well as PLT's. Monitor. -  PCP smear pending.      HIV (human immunodeficiency virus infection) - agree at risk for jirovecii, cont bactrim. - renal function stable.  DLBCL (diffuse large B cell lymphoma): - follow up with PCP.    Code Status: Full Family Communication: none  Disposition Plan: inpatient   Consultants:  none  Procedures:  none  Antibiotics:  VAnc cefepime and bactrim 9.19.2014  HPI/Subjective:  Does feel better  Objective: Filed Vitals:   04/14/13 1100 04/14/13 1329 04/14/13 2108 04/15/13 0708  BP: 97/57 90/42 128/66 140/78  Pulse: 75 71 83 96  Temp:  98.1 F (36.7 C)  98.2 F (36.8 C)  TempSrc:  Oral  Oral  Resp:  19 19 18   Height:      Weight:    75.705 kg (166 lb 14.4 oz)  SpO2:  97% 98% 97%    Intake/Output Summary (Last 24 hours) at 04/15/13 0907 Last data filed at 04/15/13 0700  Gross per 24 hour  Intake   5157 ml  Output   2150 ml  Net   3007 ml   Filed Weights   04/13/13 0243 04/13/13 0342 04/15/13 0708  Weight: 78 kg (171 lb 15.3 oz) 74.798 kg (164 lb 14.4 oz) 75.705 kg (166 lb 14.4 oz)    Exam:  General: Alert, awake, oriented x3, in no acute distress.  HEENT: No bruits, no goiter.  Heart: Regular rate and rhythm, without murmurs, rubs, gallops.  Lungs: Good air movement, crackles mainly on right. Abdomen: Soft, nontender, nondistended, positive bowel sounds.     Data Reviewed: Basic Metabolic Panel:  Recent Labs Lab 04/12/13 2230 04/13/13 0542  NA 134* 140  K 4.2 4.2  CL 99 109  CO2 26 24  GLUCOSE 127* 147*  BUN 19 15  CREATININE 0.91  0.77  CALCIUM 8.8 8.8   Liver Function Tests: No results found for this basename: AST, ALT, ALKPHOS, BILITOT, PROT, ALBUMIN,  in the last 168 hours No results found for this basename: LIPASE, AMYLASE,  in the last 168 hours No results found for this basename: AMMONIA,  in the last 168 hours CBC:  Recent Labs Lab 04/12/13 2230 04/13/13 0542 04/15/13 0500  WBC 2.4* 1.4* 2.0*  HGB 9.1* 8.7* 7.9*  HCT 26.2* 25.1* 23.1*  MCV 101.6* 102.9* 103.6*  PLT 87* 68* 99*   Cardiac Enzymes: No results found for this basename: CKTOTAL, CKMB, CKMBINDEX, TROPONINI,  in the last 168 hours BNP (last 3 results)  Recent Labs  04/12/13 2230  PROBNP 692.3*   CBG: No results found for this basename: GLUCAP,  in the last 168 hours  Recent Results (from the past 240 hour(s))  CULTURE, BLOOD (ROUTINE X 2)     Status: None   Collection Time    04/13/13  3:15 AM      Result Value Range Status   Specimen Description BLOOD RIGHT ARM   Final   Special Requests BOTTLES DRAWN AEROBIC ONLY 10CC   Final   Culture  Setup Time     Final   Value: 04/13/2013 09:48     Performed at [facility]  Final   Value:        BLOOD CULTURE RECEIVED NO GROWTH TO DATE CULTURE WILL BE HELD FOR 5 DAYS BEFORE ISSUING A FINAL NEGATIVE REPORT     Performed at [facility]   Report Status PENDING   Incomplete  CULTURE, BLOOD (ROUTINE X 2)     Status: None   Collection Time    04/13/13  3:20 AM      Result Value Range Status   Specimen Description BLOOD RIGHT HAND   Final   Special Requests BOTTLES DRAWN AEROBIC ONLY 10CC   Final   Culture  Setup Time     Final   Value: 04/13/2013 09:47     Performed at [facility]   Culture     Final   Value:        BLOOD CULTURE RECEIVED NO GROWTH TO DATE CULTURE WILL BE HELD FOR 5 DAYS BEFORE ISSUING A FINAL NEGATIVE REPORT     Performed at [facility]   Report Status PENDING   Incomplete  CULTURE, EXPECTORATED SPUTUM-ASSESSMENT      Status: None   Collection Time    04/14/13  9:22 PM      Result Value Range Status   Specimen Description SPUTUM   Final   Special Requests NONE   Final   Sputum evaluation     Final   Value: THIS SPECIMEN IS ACCEPTABLE. RESPIRATORY CULTURE REPORT TO FOLLOW.   Report Status 04/14/2013 FINAL   Final     Studies: No results found.  Scheduled Meds: . ceFEPime (MAXIPIME) IV  1 g Intravenous Q8H  . clopidogrel  75 mg Oral Daily  . dolutegravir  50 mg Oral Daily  . emtricitabine-tenofovir  1 tablet Oral Daily  . ezetimibe  10 mg Oral Daily  . feeding supplement  237 mL Oral TID PC & HS  . gabapentin  300 mg Oral TID  . heparin  5,000 Units Subcutaneous Q8H  . metoprolol succinate  12.5 mg Oral Daily  . multivitamin with minerals  1 tablet Oral Daily  . pantoprazole  40 mg Oral Daily  . predniSONE  40 mg Oral BID WC  . simvastatin  10 mg Oral q1800  . sulfamethoxazole-trimethoprim  1 tablet Oral Q12H  . valACYclovir  500 mg Oral BID  . vancomycin  1,000 mg Intravenous Q8H   Continuous Infusions: . sodium chloride 125 mL/hr at 04/15/13 0125     Marinda Elk  [facility] Pager [phone]. If 8PM-8AM, please contact night-coverage at [email or web address], password [facility] 04/15/2013, 9:07 AM  LOS: 3 days

## 2013-04-15 NOTE — Progress Notes (Signed)
[facility]  Day # 3 vancomycin Day # 3 zosyn  Day # bactrim--though not therapeutic dose  Subjective: No new complaints   Antibiotics:  Anti-infectives   Start     Dose/Rate Route Frequency Ordered Stop   04/14/13 2200  sulfamethoxazole-trimethoprim (BACTRIM DS) 800-160 MG per tablet 1 tablet  Status:  Discontinued     1 tablet Oral Every 12 hours 04/14/13 0917 04/15/13 0914   04/14/13 1000  sulfamethoxazole-trimethoprim (BACTRIM DS) 800-160 MG per tablet 1 tablet  Status:  Discontinued     1 tablet Oral Every 12 hours 04/14/13 0854 04/14/13 0917   04/13/13 1000  dolutegravir (TIVICAY) tablet 50 mg     50 mg Oral Daily 04/13/13 0251     04/13/13 1000  emtricitabine-tenofovir (TRUVADA) 200-300 MG per tablet 1 tablet     1 tablet Oral Daily 04/13/13 0251     04/13/13 1000  valACYclovir (VALTREX) tablet 500 mg     500 mg Oral 2 times daily 04/13/13 0251     04/13/13 0600  ceFEPIme (MAXIPIME) 1 g in dextrose 5 % 50 mL IVPB     1 g 100 mL/hr over 30 Minutes Intravenous 3 times per day 04/13/13 0251 04/21/13 0559   04/13/13 0600  vancomycin (VANCOCIN) IVPB 1000 mg/200 mL premix     1,000 mg 200 mL/hr over 60 Minutes Intravenous Every 8 hours 04/13/13 0305     04/13/13 0600  sulfamethoxazole-trimethoprim (BACTRIM) 320 mg in dextrose 5 % 500 mL IVPB  Status:  Discontinued     320 mg 346.7 mL/hr over 90 Minutes Intravenous 4 times per day 04/13/13 0305 04/14/13 0854   04/13/13 0200  sulfamethoxazole-trimethoprim (BACTRIM DS) 800-160 MG per tablet 1 tablet     1 tablet Oral  Once 04/13/13 0152 04/13/13 0234   04/13/13 0030  piperacillin-tazobactam (ZOSYN) IVPB 3.375 g     3.375 g 100 mL/hr over 30 Minutes Intravenous  Once 04/13/13 0028 04/13/13 0128   04/13/13 0030  vancomycin (VANCOCIN) IVPB 1000 mg/200 mL premix     1,000 mg 200 mL/hr over 60 Minutes Intravenous  Once 04/13/13 0028 04/13/13 0245      Medications: Scheduled Meds: . ceFEPime  (MAXIPIME) IV  1 g Intravenous Q8H  . clopidogrel  75 mg Oral Daily  . dolutegravir  50 mg Oral Daily  . emtricitabine-tenofovir  1 tablet Oral Daily  . ezetimibe  10 mg Oral Daily  . feeding supplement  237 mL Oral TID PC & HS  . gabapentin  300 mg Oral TID  . heparin  5,000 Units Subcutaneous Q8H  . metoprolol succinate  12.5 mg Oral Daily  . multivitamin with minerals  1 tablet Oral Daily  . pantoprazole  40 mg Oral Daily  . simvastatin  10 mg Oral q1800  . valACYclovir  500 mg Oral BID  . vancomycin  1,000 mg Intravenous Q8H   Continuous Infusions: . sodium chloride 125 mL/hr at 04/15/13 0125   PRN Meds:.diazepam, naproxen, ondansetron, zolpidem   Objective: Weight change:   Intake/Output Summary (Last 24 hours) at 04/15/13 1029 Last data filed at 04/15/13 0700  Gross per 24 hour  Intake   5157 ml  Output   2150 ml  Net   3007 ml   Blood pressure 140/78, pulse 96, temperature 98.2 F (36.8 C), temperature source Oral, resp. rate 18, height 6\' 2"  (1.88 m), weight 166 lb 14.4 oz (75.705 kg), SpO2 97.00%. Temp:  [98.1 F (  36.7 C)-98.2 F (36.8 C)] 98.2 F (36.8 C) (09/21 0708) Pulse Rate:  [71-96] 96 (09/21 0708) Resp:  [18-19] 18 (09/21 0708) BP: (90-140)/(42-78) 140/78 mmHg (09/21 0708) SpO2:  [97 %-98 %] 97 % (09/21 0708) Weight:  [166 lb 14.4 oz (75.705 kg)] 166 lb 14.4 oz (75.705 kg) (09/21 0708)  Physical Exam: General: Alert and awake, oriented x3, fatigued appearing  HEENT: anicteric sclera, , EOMI, oropharynx clear and without exudate  CVS tachy rate, normal r, no murmur rubs or gallops  Chest: rhonhi at the bases  But better airway movement today. Abdomen: soft nontender, nondistended, normal bowel sounds,  Extremities: no clubbing or edema noted bilaterally  Skin: no rashes, porta cath clean  Neuro: nonfocal, strength and sensation intact   Lab Results:  Recent Labs  04/13/13 0542 04/15/13 0500  WBC 1.4* 2.0*  HGB 8.7* 7.9*  HCT 25.1* 23.1*    PLT 68* 99*    BMET  Recent Labs  04/12/13 2230 04/13/13 0542  NA 134* 140  K 4.2 4.2  CL 99 109  CO2 26 24  GLUCOSE 127* 147*  BUN 19 15  CREATININE 0.91 0.77  CALCIUM 8.8 8.8    Micro Results: Recent Results (from the past 240 hour(s))  CULTURE, BLOOD (ROUTINE X 2)     Status: None   Collection Time    04/13/13  3:15 AM      Result Value Range Status   Specimen Description BLOOD RIGHT ARM   Final   Special Requests BOTTLES DRAWN AEROBIC ONLY 10CC   Final   Culture  Setup Time     Final   Value: 04/13/2013 09:48     Performed at [facility]   Culture     Final   Value:        BLOOD CULTURE RECEIVED NO GROWTH TO DATE CULTURE WILL BE HELD FOR 5 DAYS BEFORE ISSUING A FINAL NEGATIVE REPORT     Performed at [facility]   Report Status PENDING   Incomplete  CULTURE, BLOOD (ROUTINE X 2)     Status: None   Collection Time    04/13/13  3:20 AM      Result Value Range Status   Specimen Description BLOOD RIGHT HAND   Final   Special Requests BOTTLES DRAWN AEROBIC ONLY 10CC   Final   Culture  Setup Time     Final   Value: 04/13/2013 09:47     Performed at [facility]   Culture     Final   Value:        BLOOD CULTURE RECEIVED NO GROWTH TO DATE CULTURE WILL BE HELD FOR 5 DAYS BEFORE ISSUING A FINAL NEGATIVE REPORT     Performed at [facility]   Report Status PENDING   Incomplete  CULTURE, EXPECTORATED SPUTUM-ASSESSMENT     Status: None   Collection Time    04/14/13  9:22 PM      Result Value Range Status   Specimen Description SPUTUM   Final   Special Requests NONE   Final   Sputum evaluation     Final   Value: THIS SPECIMEN IS ACCEPTABLE. RESPIRATORY CULTURE REPORT TO FOLLOW.   Report Status 04/14/2013 FINAL   Final    Studies/Results: No results found.    Assessment/Plan: Troy Holden is a 62 y.o. 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 with 5FU admitted with fevers, hypotension cough, leukopenia, TTPENia, CXR concerning for HCAP vs PCP   #1 HCAP vs PCP:  would make sure that he has sputum sent for PCP, LDH being normal is reassuring and he has improved despite not being on treatment dose TMP/SMX   --I AM ACTUALLY GOING TO DC HIS BACTRIM, SO THAT IF HE CONTINUES TO IMPROVE WE CAN REASON THAT THIS IS NOT PCP, IF HE CONTINUES TO DO WELL and PCP smears are negative continue therapy for healthcare associated pneumonia and then place him back on Bactrim but only at prophylactic dose., he will also need reinstitution of azithromycin prophylaxis but this has not yet urgent at this point time.   #2 HIV: perfectly controlled per hx , not checked since May, [facility] aware of his admission. Will also let Dr. Lurlean Leyden know of his admission   #3 Rectal squamous cell ca: sp last chemo mid August.    Dr. Ninetta Lights is back tomorrow.    LOS: 3 days   Acey Lav 04/15/2013, 10:29 AM

## 2013-04-16 DIAGNOSIS — J189 Pneumonia, unspecified organism: Secondary | ICD-10-CM

## 2013-04-16 DIAGNOSIS — A419 Sepsis, unspecified organism: Principal | ICD-10-CM

## 2013-04-16 DIAGNOSIS — B20 Human immunodeficiency virus [HIV] disease: Secondary | ICD-10-CM

## 2013-04-16 DIAGNOSIS — D61818 Other pancytopenia: Secondary | ICD-10-CM

## 2013-04-16 DIAGNOSIS — Z21 Asymptomatic human immunodeficiency virus [HIV] infection status: Secondary | ICD-10-CM

## 2013-04-16 LAB — HIV-1 RNA ULTRAQUANT REFLEX TO GENTYP+
HIV 1 RNA Quant: <20 copies/mL (ref <20)
HIV-1 RNA Quant, Log: <1.3 {Log} (ref <1.30)

## 2013-04-16 LAB — T-HELPER CELLS (CD4) COUNT (NOT AT ARMC)
CD4 % Helper T Cell: 17 % — ABNORMAL LOW (ref 33–55)
CD4 T Cell Abs: 50 /uL — ABNORMAL LOW (ref 400–2700)

## 2013-04-16 LAB — PNEUMOCYSTIS JIROVECI SMEAR BY DFA

## 2013-04-16 LAB — VANCOMYCIN, TROUGH: Vancomycin Tr: 17.5 ug/mL (ref 10.0–20.0)

## 2013-04-16 MED ORDER — LEVOFLOXACIN 500 MG PO TABS
500.0000 mg | ORAL_TABLET | Freq: Every day | ORAL | Status: DC
  Administered 2013-04-16 – 2013-04-17 (×2): 500 mg via ORAL
  Filled 2013-04-16 (×2): qty 1

## 2013-04-16 MED ORDER — AZITHROMYCIN 600 MG PO TABS
1200.0000 mg | ORAL_TABLET | ORAL | Status: DC
  Administered 2013-04-16: 1200 mg via ORAL
  Filled 2013-04-16 (×2): qty 2

## 2013-04-16 NOTE — Progress Notes (Signed)
INFECTIOUS DISEASE PROGRESS NOTE  ID: Troy Holden is a 62 y.o. male with  Principal Problem:   HCAP (healthcare-associated pneumonia) Active Problems:   DLBCL (diffuse large B cell lymphoma)   HIV (human immunodeficiency virus infection)   Sepsis  Subjective: Fells better  Abtx:  Anti-infectives   Start     Dose/Rate Route Frequency Ordered Stop   04/16/13 1000  levofloxacin (LEVAQUIN) tablet 500 mg     500 mg Oral Daily 04/16/13 0817     04/16/13 0830  azithromycin (ZITHROMAX) tablet 1,200 mg     1,200 mg Oral Weekly 04/16/13 0817     04/14/13 2200  sulfamethoxazole-trimethoprim (BACTRIM DS) 800-160 MG per tablet 1 tablet  Status:  Discontinued     1 tablet Oral Every 12 hours 04/14/13 0917 04/15/13 0914   04/14/13 1000  sulfamethoxazole-trimethoprim (BACTRIM DS) 800-160 MG per tablet 1 tablet  Status:  Discontinued     1 tablet Oral Every 12 hours 04/14/13 0854 04/14/13 0917   04/13/13 1000  dolutegravir (TIVICAY) tablet 50 mg     50 mg Oral Daily 04/13/13 0251     04/13/13 1000  emtricitabine-tenofovir (TRUVADA) 200-300 MG per tablet 1 tablet     1 tablet Oral Daily 04/13/13 0251     04/13/13 1000  valACYclovir (VALTREX) tablet 500 mg     500 mg Oral 2 times daily 04/13/13 0251     04/13/13 0600  ceFEPIme (MAXIPIME) 1 g in dextrose 5 % 50 mL IVPB  Status:  Discontinued     1 g 100 mL/hr over 30 Minutes Intravenous 3 times per day 04/13/13 0251 04/16/13 0817   04/13/13 0600  vancomycin (VANCOCIN) IVPB 1000 mg/200 mL premix  Status:  Discontinued     1,000 mg 200 mL/hr over 60 Minutes Intravenous Every 8 hours 04/13/13 0305 04/16/13 0817   04/13/13 0600  sulfamethoxazole-trimethoprim (BACTRIM) 320 mg in dextrose 5 % 500 mL IVPB  Status:  Discontinued     320 mg 346.7 mL/hr over 90 Minutes Intravenous 4 times per day 04/13/13 0305 04/14/13 0854   04/13/13 0200  sulfamethoxazole-trimethoprim (BACTRIM DS) 800-160 MG per tablet 1 tablet     1 tablet Oral  Once 04/13/13  0152 04/13/13 0234   04/13/13 0030  piperacillin-tazobactam (ZOSYN) IVPB 3.375 g     3.375 g 100 mL/hr over 30 Minutes Intravenous  Once 04/13/13 0028 04/13/13 0128   04/13/13 0030  vancomycin (VANCOCIN) IVPB 1000 mg/200 mL premix     1,000 mg 200 mL/hr over 60 Minutes Intravenous  Once 04/13/13 0028 04/13/13 0245      Medications:  Scheduled: . azithromycin  1,200 mg Oral Weekly  . clopidogrel  75 mg Oral Daily  . dolutegravir  50 mg Oral Daily  . emtricitabine-tenofovir  1 tablet Oral Daily  . ezetimibe  10 mg Oral Daily  . feeding supplement  237 mL Oral TID PC & HS  . gabapentin  300 mg Oral TID  . heparin  5,000 Units Subcutaneous Q8H  . levofloxacin  500 mg Oral Daily  . metoprolol succinate  12.5 mg Oral Daily  . multivitamin with minerals  1 tablet Oral Daily  . pantoprazole  40 mg Oral Daily  . simvastatin  10 mg Oral q1800  . valACYclovir  500 mg Oral BID    Objective: Vital signs in last 24 hours: Temp:  [97.7 F (36.5 C)-98.5 F (36.9 C)] 98.5 F (36.9 C) (09/22 0746) Pulse Rate:  [79-90] 79 (09/22  2130) Resp:  [18-19] 18 (09/22 0746) BP: (112-131)/(58-74) 118/74 mmHg (09/22 0907) SpO2:  [96 %-97 %] 97 % (09/22 0746) Weight:  [75.07 kg (165 lb 8 oz)] 75.07 kg (165 lb 8 oz) (09/22 0746)   General appearance: alert, cooperative and no distress Resp: clear to auscultation bilaterally Cardio: regular rate and rhythm GI: normal findings: bowel sounds normal and soft, non-tender  Lab Results  Recent Labs  04/15/13 0500  WBC 2.0*  HGB 7.9*  HCT 23.1*   Liver Panel No results found for this basename: PROT, ALBUMIN, AST, ALT, ALKPHOS, BILITOT, BILIDIR, IBILI,  in the last 72 hours Sedimentation Rate No results found for this basename: ESRSEDRATE,  in the last 72 hours C-Reactive Protein No results found for this basename: CRP,  in the last 72 hours  Microbiology: Recent Results (from the past 240 hour(s))  CULTURE, BLOOD (ROUTINE X 2)     Status:  None   Collection Time    04/13/13  3:15 AM      Result Value Range Status   Specimen Description BLOOD RIGHT ARM   Final   Special Requests BOTTLES DRAWN AEROBIC ONLY 10CC   Final   Culture  Setup Time     Final   Value: 04/13/2013 09:48     Performed at [facility]   Culture     Final   Value:        BLOOD CULTURE RECEIVED NO GROWTH TO DATE CULTURE WILL BE HELD FOR 5 DAYS BEFORE ISSUING A FINAL NEGATIVE REPORT     Performed at [facility]   Report Status PENDING   Incomplete  CULTURE, BLOOD (ROUTINE X 2)     Status: None   Collection Time    04/13/13  3:20 AM      Result Value Range Status   Specimen Description BLOOD RIGHT HAND   Final   Special Requests BOTTLES DRAWN AEROBIC ONLY 10CC   Final   Culture  Setup Time     Final   Value: 04/13/2013 09:47     Performed at [facility]   Culture     Final   Value:        BLOOD CULTURE RECEIVED NO GROWTH TO DATE CULTURE WILL BE HELD FOR 5 DAYS BEFORE ISSUING A FINAL NEGATIVE REPORT     Performed at [facility]   Report Status PENDING   Incomplete  CULTURE, EXPECTORATED SPUTUM-ASSESSMENT     Status: None   Collection Time    04/14/13  9:22 PM      Result Value Range Status   Specimen Description SPUTUM   Final   Special Requests NONE   Final   Sputum evaluation     Final   Value: THIS SPECIMEN IS ACCEPTABLE. RESPIRATORY CULTURE REPORT TO FOLLOW.   Report Status 04/14/2013 FINAL   Final  CULTURE, RESPIRATORY (NON-EXPECTORATED)     Status: None   Collection Time    04/14/13  9:22 PM      Result Value Range Status   Specimen Description SPUTUM   Final   Special Requests NONE   Final   Gram Stain PENDING   Incomplete   Culture     Final   Value: Culture reincubated for better growth     Performed at [facility]   Report Status PENDING   Incomplete    Studies/Results: No results found.   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

## 2013-04-16 NOTE — Progress Notes (Signed)
Pt ambulated with mask on to vending machine with RN. Pt with no shortness of  Breath with ambulating, steady on feet. Pt washed hands with warm water and soap before and after walking to vending machine. Will continue to monitor. Baron Hamper, RN

## 2013-04-16 NOTE — Progress Notes (Signed)
[facility] PROGRESS NOTE Interim History: 62 y.o. male with HIV, DLBCL on chemotherapy, who presents with SOB, cough, fever, weakness and chest pain. Last chemo was 4 weeks ago. Cough is productive.admitted for HCAp and sepsis.   Assessment/Plan: HCAP (healthcare-associated pneumonia)/ Sepsis: - Held Bactrim on 9.21.2014, change to prophilaxis on d/c, vanc and cefepime [id number] change to levaquin. I agree with ID plan. - leukopenia improving as well as PLT's. Monitor. -  PCP smear pending.     HIV (human immunodeficiency virus infection) - agree at risk for jirovecii, Heldbactrim. - renal function stable.  DLBCL (diffuse large B cell lymphoma): - follow up with Onc..    Code Status: Full Family Communication: none  Disposition Plan: inpatient   Consultants:  none  Procedures:  none  Antibiotics:  VAnc cefepime and9.19.2014  Bactrim held 9.22.2014  HPI/Subjective: Does feel better  Objective: Filed Vitals:   04/15/13 1051 04/15/13 1449 04/15/13 1918 04/16/13 0746  BP: 122/72 117/58 131/64 112/70  Pulse: 74 86 83 90  Temp:  97.8 F (36.6 C) 97.7 F (36.5 C) 98.5 F (36.9 C)  TempSrc:  Oral Oral Oral  Resp:  19 18 18   Height:      Weight:    75.07 kg (165 lb 8 oz)  SpO2:  96% 96% 97%    Intake/Output Summary (Last 24 hours) at 04/16/13 0810 Last data filed at 04/16/13 0345  Gross per 24 hour  Intake 4195.75 ml  Output   1150 ml  Net 3045.75 ml   Filed Weights   04/13/13 0342 04/15/13 0708 04/16/13 0746  Weight: 74.798 kg (164 lb 14.4 oz) 75.705 kg (166 lb 14.4 oz) 75.07 kg (165 lb 8 oz)    Exam:  General: Alert, awake, oriented x3, in no acute distress.  HEENT: No bruits, no goiter.  Heart: Regular rate and rhythm, without murmurs, rubs, gallops.  Lungs: Good air movement, crackles mainly on right. Abdomen: Soft, nontender, nondistended, positive bowel sounds.     Data Reviewed: Basic Metabolic Panel:  Recent Labs Lab  04/12/13 2230 04/13/13 0542  NA 134* 140  K 4.2 4.2  CL 99 109  CO2 26 24  GLUCOSE 127* 147*  BUN 19 15  CREATININE 0.91 0.77  CALCIUM 8.8 8.8   Liver Function Tests: No results found for this basename: AST, ALT, ALKPHOS, BILITOT, PROT, ALBUMIN,  in the last 168 hours No results found for this basename: LIPASE, AMYLASE,  in the last 168 hours No results found for this basename: AMMONIA,  in the last 168 hours CBC:  Recent Labs Lab 04/12/13 2230 04/13/13 0542 04/15/13 0500  WBC 2.4* 1.4* 2.0*  HGB 9.1* 8.7* 7.9*  HCT 26.2* 25.1* 23.1*  MCV 101.6* 102.9* 103.6*  PLT 87* 68* 99*   Cardiac Enzymes: No results found for this basename: CKTOTAL, CKMB, CKMBINDEX, TROPONINI,  in the last 168 hours BNP (last 3 results)  Recent Labs  04/12/13 2230  PROBNP 692.3*   CBG: No results found for this basename: GLUCAP,  in the last 168 hours  Recent Results (from the past 240 hour(s))  CULTURE, BLOOD (ROUTINE X 2)     Status: None   Collection Time    04/13/13  3:15 AM      Result Value Range Status   Specimen Description BLOOD RIGHT ARM   Final   Special Requests BOTTLES DRAWN AEROBIC ONLY 10CC   Final   Culture  Setup Time     Final   Value: 04/13/2013  09:48     Performed at [facility]     Final   Value:        BLOOD CULTURE RECEIVED NO GROWTH TO DATE CULTURE WILL BE HELD FOR 5 DAYS BEFORE ISSUING A FINAL NEGATIVE REPORT     Performed at [facility]   Report Status PENDING   Incomplete  CULTURE, BLOOD (ROUTINE X 2)     Status: None   Collection Time    04/13/13  3:20 AM      Result Value Range Status   Specimen Description BLOOD RIGHT HAND   Final   Special Requests BOTTLES DRAWN AEROBIC ONLY 10CC   Final   Culture  Setup Time     Final   Value: 04/13/2013 09:47     Performed at [facility]   Culture     Final   Value:        BLOOD CULTURE RECEIVED NO GROWTH TO DATE CULTURE WILL BE HELD FOR 5 DAYS BEFORE ISSUING A FINAL NEGATIVE  REPORT     Performed at [facility]   Report Status PENDING   Incomplete  CULTURE, EXPECTORATED SPUTUM-ASSESSMENT     Status: None   Collection Time    04/14/13  9:22 PM      Result Value Range Status   Specimen Description SPUTUM   Final   Special Requests NONE   Final   Sputum evaluation     Final   Value: THIS SPECIMEN IS ACCEPTABLE. RESPIRATORY CULTURE REPORT TO FOLLOW.   Report Status 04/14/2013 FINAL   Final     Studies: No results found.  Scheduled Meds: . ceFEPime (MAXIPIME) IV  1 g Intravenous Q8H  . clopidogrel  75 mg Oral Daily  . dolutegravir  50 mg Oral Daily  . emtricitabine-tenofovir  1 tablet Oral Daily  . ezetimibe  10 mg Oral Daily  . feeding supplement  237 mL Oral TID PC & HS  . gabapentin  300 mg Oral TID  . heparin  5,000 Units Subcutaneous Q8H  . metoprolol succinate  12.5 mg Oral Daily  . multivitamin with minerals  1 tablet Oral Daily  . pantoprazole  40 mg Oral Daily  . simvastatin  10 mg Oral q1800  . valACYclovir  500 mg Oral BID  . vancomycin  1,000 mg Intravenous Q8H   Continuous Infusions: . sodium chloride 125 mL/hr at 04/16/13 0459     Marinda Elk  [facility] Pager [phone]. If 8PM-8AM, please contact night-coverage at [email or web address], password [facility] 04/16/2013, 8:10 AM  LOS: 4 days

## 2013-04-16 NOTE — Progress Notes (Signed)
Report given to receiving RN. Patient is stable. No signs or symptoms of distress or discomfort. No verbal complaints. 

## 2013-04-17 LAB — HIV 1/2 CONFIRMATION
HIV-1 antibody: POSITIVE
HIV-2 Ab: NEGATIVE

## 2013-04-17 LAB — CULTURE, RESPIRATORY W GRAM STAIN: Culture: NORMAL

## 2013-04-17 MED ORDER — AZITHROMYCIN 600 MG PO TABS: 1200.0000 mg | ORAL_TABLET | ORAL | Status: DC

## 2013-04-17 MED ORDER — HEPARIN SOD (PORK) LOCK FLUSH 100 UNIT/ML IV SOLN
500.0000 [IU] | INTRAVENOUS | Status: AC | PRN
  Administered 2013-04-17: 11:00:00 500 [IU]

## 2013-04-17 MED ORDER — LEVOFLOXACIN 500 MG PO TABS: 500.0000 mg | ORAL_TABLET | Freq: Every day | ORAL | Status: DC

## 2013-04-17 MED ORDER — SULFAMETHOXAZOLE-TMP DS 800-160 MG PO TABS: 1.0000 | ORAL_TABLET | Freq: Every day | ORAL | Status: DC

## 2013-04-17 NOTE — Progress Notes (Signed)
DC Tele, DC Porta Cath by IV team, DC Home. Discharge instructions and home medications discussed with patient. Patient denied any questions or concerns at this time. Patient leaving unit via wheelchair and appears in no acute distress.

## 2013-04-17 NOTE — Discharge Summary (Signed)
Physician Discharge Summary  Troy Holden ZOX:[id number] DOB: [birth year] DOA: 04/12/2013  PCP: Benita Gutter, MD  Admit date: 04/12/2013 Discharge date: 04/17/2013  Time spent: 30 minutes  Recommendations for Outpatient Follow-up:  1. Follow up with ID at [facility] in 1 week 2. Oncology at [facility] 2=3 weeks. 3. follow-up on, etc.)  Discharge Diagnoses:  Principal Problem:   HCAP (healthcare-associated pneumonia) Active Problems:   DLBCL (diffuse large B cell lymphoma)   HIV (human immunodeficiency virus infection)   Sepsis   Discharge Condition: stable  Diet recommendation: regular  Filed Weights   04/15/13 0708 04/16/13 0746 04/17/13 0629  Weight: 75.705 kg (166 lb 14.4 oz) 75.07 kg (165 lb 8 oz) 74.617 kg (164 lb 8 oz)    History of present illness:  62 y.o. male with HIV, DLBCL on chemotherapy, who presents with SOB, cough, fever, weakness and chest pain. Last chemo was 4 weeks ago. Cough is productive. Last CD4 count was in may of this year and was 236 (available via [facility] records). Work up in the ED demonstrates HCAP with sepsis and mild hypotension.   Hospital Course:  HCAP (healthcare-associated pneumonia)/ Sepsis:  - started on vanc, cefepime and bactrim for possible PCP PNA. - Held Bactrim on 9.21.2014, as LHD  With in normal limits. PCP smear oder. - Change to prophilaxis on d/c, vanc and cefepime 9.19.2014-9.22.2014 change to levaquin. - I agree with ID plan.  - leukopenia improving as well as PLT's. Monitor.    HIV (human immunodeficiency virus infection)  - cont HIV medication, LDH 114. - renal function stable.   DLBCL (diffuse large B cell lymphoma):  - follow up with Onc..   Procedures: CXR 9.18.2014: chest suggests bronchitis with multilobar bronchopneumonia   Consultations:  ID  Discharge Exam: Filed Vitals:   04/17/13 1030  BP: 117/72  Pulse: 81  Temp:   Resp:     General: A&O x3 Cardiovascular: RRR Respiratory: good air movement CTA  B/L  Discharge Instructions  Discharge Orders   Future Orders Complete By Expires   Diet - low sodium heart healthy  As directed    Increase activity slowly  As directed        Medication List         azithromycin 600 MG tablet  Commonly known as:  ZITHROMAX  Take 2 tablets (1,200 mg total) by mouth once a week.     clopidogrel 75 MG tablet  Commonly known as:  PLAVIX  Take 75 mg by mouth daily.     diazepam 5 MG tablet  Commonly known as:  VALIUM  Take 5 mg by mouth every 6 (six) hours as needed for anxiety.     emtricitabine-tenofovir 200-300 MG per tablet  Commonly known as:  TRUVADA  Take 1 tablet by mouth daily.     ezetimibe 10 MG tablet  Commonly known as:  ZETIA  Take 10 mg by mouth daily.     gabapentin 300 MG capsule  Commonly known as:  NEURONTIN  Take 300 mg by mouth 3 (three) times daily.     levofloxacin 500 MG tablet  Commonly known as:  LEVAQUIN  Take 1 tablet (500 mg total) by mouth daily.     lisinopril 5 MG tablet  Commonly known as:  PRINIVIL,ZESTRIL  Take 5 mg by mouth daily.     loperamide 2 MG capsule  Commonly known as:  IMODIUM  Take 1-2 mg by mouth 4 (four) times daily as needed for diarrhea or loose stools (  Take 2 capsules to start then one capsule every 2 hours as needed).     metoprolol succinate 25 MG 24 hr tablet  Commonly known as:  TOPROL-XL  Take 12.5 mg by mouth daily.     naproxen 250 MG tablet  Commonly known as:  NAPROSYN  Take 250 mg by mouth 2 (two) times daily as needed (for pain).     nitroGLYCERIN 0.4 MG SL tablet  Commonly known as:  NITROSTAT  Place 0.4 mg under the tongue every 5 (five) minutes as needed.     ondansetron 8 MG tablet  Commonly known as:  ZOFRAN  Take 8 mg by mouth every 8 (eight) hours as needed for nausea.     pantoprazole 40 MG tablet  Commonly known as:  PROTONIX  Take 40 mg by mouth daily.     pravastatin 40 MG tablet  Commonly known as:  PRAVACHOL  Take 40 mg by mouth daily.      prochlorperazine 10 MG tablet  Commonly known as:  COMPAZINE  Take 10 mg by mouth every 6 (six) hours as needed.     sulfamethoxazole-trimethoprim 800-160 MG per tablet  Commonly known as:  BACTRIM DS  Take 1 tablet by mouth daily.     TIVICAY 50 MG tablet  Generic drug:  dolutegravir  Take 50 mg by mouth daily.     valACYclovir 500 MG tablet  Commonly known as:  VALTREX  Take 500 mg by mouth 2 (two) times daily.     zolpidem 10 MG tablet  Commonly known as:  AMBIEN  Take 10 mg by mouth at bedtime as needed for sleep.       Allergies  Allergen Reactions  . Lidocaine Hcl Rash    Patient had to be rushed to hospital from dentist office after receiving xylocaine  . Trazodone And Nefazodone Other (See Comments)    Nightmares, hangover like feeling for 2-3 days       Follow-up Information   Follow up with [facility], MD In 3 weeks. (hospital follow up)    Contact information:   [address] [facility], [id number] PHYSICIAN OFFICE [address]. [place] [address] [phone]       Follow up with [facility] In 1 week. (hospital follow up)    Specialty:  Infectious Diseases   Contact information:   [address] [facility], [id number]LO[address] [facility] [place] [address]        The results of significant diagnostics from this hospitalization (including imaging, microbiology, ancillary and laboratory) are listed below for reference.    Significant Diagnostic Studies: Dg Chest 2 View  04/13/2013   CLINICAL DATA:  Chills, fevers and fatigue.  EXAM: CHEST  2 VIEW  COMPARISON:  CHEST x-ray 10/02/2011.  FINDINGS: Diffuse peribronchial cuffing. Patchy ill-defined opacities suggestive of peribronchovascular ground-glass attenuation airspace disease. No pleural effusions. No evidence of pulmonary edema. Heart size is normal. Mediastinal contours are unremarkable. Right internal jugular single-lumen porta cath with tip terminating in the distal superior vena  cava. Atherosclerosis in the thoracic aorta.  IMPRESSION: 1. The appearance of the chest suggests bronchitis with multilobar bronchopneumonia, as above. 2. Atherosclerosis.   Electronically Signed   By: Trudie Reed M.D.   On: 04/13/2013 01:42    Microbiology: Recent Results (from the past 240 hour(s))  CULTURE, BLOOD (ROUTINE X 2)     Status: None   Collection Time    04/13/13  3:15 AM  Result Value Range Status   Specimen Description BLOOD RIGHT ARM   Final   Special Requests BOTTLES DRAWN AEROBIC ONLY 10CC   Final   Culture  Setup Time     Final   Value: 04/13/2013 09:48     Performed at [facility]   Culture     Final   Value:        BLOOD CULTURE RECEIVED NO GROWTH TO DATE CULTURE WILL BE HELD FOR 5 DAYS BEFORE ISSUING A FINAL NEGATIVE REPORT     Performed at [facility]   Report Status PENDING   Incomplete  CULTURE, BLOOD (ROUTINE X 2)     Status: None   Collection Time    04/13/13  3:20 AM      Result Value Range Status   Specimen Description BLOOD RIGHT HAND   Final   Special Requests BOTTLES DRAWN AEROBIC ONLY 10CC   Final   Culture  Setup Time     Final   Value: 04/13/2013 09:47     Performed at [facility]   Culture     Final   Value:        BLOOD CULTURE RECEIVED NO GROWTH TO DATE CULTURE WILL BE HELD FOR 5 DAYS BEFORE ISSUING A FINAL NEGATIVE REPORT     Performed at [facility]   Report Status PENDING   Incomplete  CULTURE, EXPECTORATED SPUTUM-ASSESSMENT     Status: None   Collection Time    04/14/13  9:22 PM      Result Value Range Status   Specimen Description SPUTUM   Final   Special Requests NONE   Final   Sputum evaluation     Final   Value: THIS SPECIMEN IS ACCEPTABLE. RESPIRATORY CULTURE REPORT TO FOLLOW.   Report Status 04/14/2013 FINAL   Final  CULTURE, RESPIRATORY (NON-EXPECTORATED)     Status: None   Collection Time    04/14/13  9:22 PM      Result Value Range Status   Specimen Description SPUTUM   Final    Special Requests NONE   Final   Gram Stain     Final   Value: MODERATE WBC PRESENT, PREDOMINANTLY PMN     RARE SQUAMOUS EPITHELIAL CELLS PRESENT     MODERATE GRAM POSITIVE COCCI     IN PAIRS IN CLUSTERS     Performed at [facility]   Culture     Final   Value: NORMAL OROPHARYNGEAL FLORA     Performed at [facility]   Report Status 04/17/2013 FINAL   Final  PNEUMOCYSTIS JIROVECI SMEAR BY DFA     Status: None   Collection Time    04/14/13  9:22 PM      Result Value Range Status   Specimen Source-PJSRC SPUTUM   Final   Pneumocystis jiroveci Ag NEGATIVE   Final   Comment: Performed at [facility]     Labs: Basic Metabolic Panel:  Recent Labs Lab 04/12/13 2230 04/13/13 0542  NA 134* 140  K 4.2 4.2  CL 99 109  CO2 26 24  GLUCOSE 127* 147*  BUN 19 15  CREATININE 0.91 0.77  CALCIUM 8.8 8.8   Liver Function Tests: No results found for this basename: AST, ALT, ALKPHOS, BILITOT, PROT, ALBUMIN,  in the last 168 hours No results found for this basename: LIPASE, AMYLASE,  in the last 168 hours No results found for this basename: AMMONIA,  in  the last 168 hours CBC:  Recent Labs Lab 04/12/13 2230 04/13/13 0542 04/15/13 0500  WBC 2.4* 1.4* 2.0*  HGB 9.1* 8.7* 7.9*  HCT 26.2* 25.1* 23.1*  MCV 101.6* 102.9* 103.6*  PLT 87* 68* 99*   Cardiac Enzymes: No results found for this basename: CKTOTAL, CKMB, CKMBINDEX, TROPONINI,  in the last 168 hours BNP: BNP (last 3 results)  Recent Labs  04/12/13 2230  PROBNP 692.3*   CBG: No results found for this basename: GLUCAP,  in the last 168 hours     Signed:  Marinda Elk  [facility] 04/17/2013, 10:33 AM

## 2013-04-17 NOTE — Clinical Documentation Improvement (Signed)
THIS DOCUMENT IS NOT A PERMANENT PART OF THE MEDICAL RECORD  Please update your documentation with the medical record to reflect your response to this query. If you need help knowing how to do this please call [phone].  04/17/13   Dear Dr. Robb Matar / [facility],  In a better effort to capture your patient's severity of illness, reflect appropriate length of stay and utilization of resources, a review of the patient medical record has revealed: patient is described as anorexic, cachetic.   Patient is 6\' 2"  tall and weighs 164 lbs  BMI = 21.2  Cachetic noted in 9/19 progress note  Anorexia, weight loss in 9/20 ID consult  Being treated with Ensure 4 times daily  Based on your clinical judgment, please clarify and document in progress note and discharge summary, a diagnosis with severity ( mild, moderate or severe ) associated with the above listed data and treatment provided.  In responding to this query please exercise your independent judgment.  The fact that a query is asked, does not imply that any particular answer is desired or expected.  You may use possible, probable, or suspect in describing condition.    Reviewed:  no additional documentation provided  Thank Lucilla Edin  Clinical Documentation Specialist: [phone] Health Information Management [place]

## 2013-04-19 LAB — CULTURE, BLOOD (ROUTINE X 2): Culture: NO GROWTH

## 2013-08-13 ENCOUNTER — Emergency Department (HOSPITAL_COMMUNITY)
Admission: EM | Admit: 2013-08-13 | Discharge: 2013-08-13 | Disposition: A | Discharge status: Home / Self Care | Payer: Medicaid Other | Attending: Emergency Medicine | Admitting: Emergency Medicine

## 2013-08-13 ENCOUNTER — Encounter (HOSPITAL_COMMUNITY): Payer: Self-pay | Admitting: Emergency Medicine

## 2013-08-13 ENCOUNTER — Emergency Department (HOSPITAL_COMMUNITY): Payer: Medicaid Other

## 2013-08-13 DIAGNOSIS — F3289 Other specified depressive episodes: Secondary | ICD-10-CM | POA: Insufficient documentation

## 2013-08-13 DIAGNOSIS — X500XXA Overexertion from strenuous movement or load, initial encounter: Secondary | ICD-10-CM | POA: Insufficient documentation

## 2013-08-13 DIAGNOSIS — H269 Unspecified cataract: Secondary | ICD-10-CM | POA: Insufficient documentation

## 2013-08-13 DIAGNOSIS — Z8619 Personal history of other infectious and parasitic diseases: Secondary | ICD-10-CM | POA: Insufficient documentation

## 2013-08-13 DIAGNOSIS — Z8701 Personal history of pneumonia (recurrent): Secondary | ICD-10-CM | POA: Insufficient documentation

## 2013-08-13 DIAGNOSIS — I10 Essential (primary) hypertension: Secondary | ICD-10-CM | POA: Insufficient documentation

## 2013-08-13 DIAGNOSIS — K219 Gastro-esophageal reflux disease without esophagitis: Secondary | ICD-10-CM | POA: Insufficient documentation

## 2013-08-13 DIAGNOSIS — F0390 Unspecified dementia without behavioral disturbance: Secondary | ICD-10-CM | POA: Insufficient documentation

## 2013-08-13 DIAGNOSIS — Y929 Unspecified place or not applicable: Secondary | ICD-10-CM | POA: Insufficient documentation

## 2013-08-13 DIAGNOSIS — Z5189 Encounter for other specified aftercare: Secondary | ICD-10-CM | POA: Insufficient documentation

## 2013-08-13 DIAGNOSIS — Z87898 Personal history of other specified conditions: Secondary | ICD-10-CM | POA: Insufficient documentation

## 2013-08-13 DIAGNOSIS — F411 Generalized anxiety disorder: Secondary | ICD-10-CM | POA: Insufficient documentation

## 2013-08-13 DIAGNOSIS — Z21 Asymptomatic human immunodeficiency virus [HIV] infection status: Secondary | ICD-10-CM | POA: Insufficient documentation

## 2013-08-13 DIAGNOSIS — I519 Heart disease, unspecified: Secondary | ICD-10-CM | POA: Insufficient documentation

## 2013-08-13 DIAGNOSIS — F329 Major depressive disorder, single episode, unspecified: Secondary | ICD-10-CM | POA: Insufficient documentation

## 2013-08-13 DIAGNOSIS — E039 Hypothyroidism, unspecified: Secondary | ICD-10-CM | POA: Insufficient documentation

## 2013-08-13 DIAGNOSIS — Z888 Allergy status to other drugs, medicaments and biological substances status: Secondary | ICD-10-CM | POA: Insufficient documentation

## 2013-08-13 DIAGNOSIS — D649 Anemia, unspecified: Secondary | ICD-10-CM | POA: Insufficient documentation

## 2013-08-13 DIAGNOSIS — I499 Cardiac arrhythmia, unspecified: Secondary | ICD-10-CM | POA: Insufficient documentation

## 2013-08-13 DIAGNOSIS — Y9301 Activity, walking, marching and hiking: Secondary | ICD-10-CM | POA: Insufficient documentation

## 2013-08-13 DIAGNOSIS — F17200 Nicotine dependence, unspecified, uncomplicated: Secondary | ICD-10-CM | POA: Insufficient documentation

## 2013-08-13 DIAGNOSIS — E785 Hyperlipidemia, unspecified: Secondary | ICD-10-CM | POA: Insufficient documentation

## 2013-08-13 DIAGNOSIS — K759 Inflammatory liver disease, unspecified: Secondary | ICD-10-CM | POA: Insufficient documentation

## 2013-08-13 DIAGNOSIS — I2510 Atherosclerotic heart disease of native coronary artery without angina pectoris: Secondary | ICD-10-CM | POA: Insufficient documentation

## 2013-08-13 DIAGNOSIS — Z7902 Long term (current) use of antithrombotics/antiplatelets: Secondary | ICD-10-CM | POA: Insufficient documentation

## 2013-08-13 DIAGNOSIS — Z79899 Other long term (current) drug therapy: Secondary | ICD-10-CM | POA: Insufficient documentation

## 2013-08-13 DIAGNOSIS — S93409A Sprain of unspecified ligament of unspecified ankle, initial encounter: Secondary | ICD-10-CM | POA: Insufficient documentation

## 2013-08-13 MED ORDER — OXYCODONE-ACETAMINOPHEN 5-325 MG PO TABS: 1.0000 | ORAL_TABLET | ORAL | Status: DC | PRN

## 2013-08-13 NOTE — ED Notes (Signed)
Pt was walking dog and tripped and hurt left ankle and happened yesterday

## 2013-08-13 NOTE — ED Notes (Signed)
Pt in radiology department for x ray.

## 2013-08-13 NOTE — Discharge Instructions (Signed)
Recommend naproxen as well as rest, ice to the area, compression with ankle brace, and elevation. Take Percocet as needed for severe pain control. Followup with an orthopedist for further evaluation of symptoms.  Ankle Sprain An ankle sprain is an injury to the strong, fibrous tissues (ligaments) that hold the bones of your ankle joint together.  CAUSES An ankle sprain is usually caused by a fall or by twisting your ankle. Ankle sprains most commonly occur when you step on the outer edge of your foot, and your ankle turns inward. People who participate in sports are more prone to these types of injuries.  SYMPTOMS   Pain in your ankle. The pain may be present at rest or only when you are trying to stand or walk.  Swelling.  Bruising. Bruising may develop immediately or within 1 to 2 days after your injury.  Difficulty standing or walking, particularly when turning corners or changing directions. DIAGNOSIS  Your caregiver will ask you details about your injury and perform a physical exam of your ankle to determine if you have an ankle sprain. During the physical exam, your caregiver will press on and apply pressure to specific areas of your foot and ankle. Your caregiver will try to move your ankle in certain ways. An X-ray exam may be done to be sure a bone was not broken or a ligament did not separate from one of the bones in your ankle (avulsion fracture).  TREATMENT  Certain types of braces can help stabilize your ankle. Your caregiver can make a recommendation for this. Your caregiver may recommend the use of medicine for pain. If your sprain is severe, your caregiver may refer you to a surgeon who helps to restore function to parts of your skeletal system (orthopedist) or a physical therapist. [place] ice to your injury for 1 2 days or as directed by your caregiver. Applying ice helps to reduce inflammation and pain.  Put ice in a plastic bag.  Place a towel  between your skin and the bag.  Leave the ice on for 15-20 minutes at a time, every 2 hours while you are awake.  Only take over-the-counter or prescription medicines for pain, discomfort, or fever as directed by your caregiver.  Elevate your injured ankle above the level of your heart as much as possible for 2 3 days.  If your caregiver recommends crutches, use them as instructed. Gradually put weight on the affected ankle. Continue to use crutches or a cane until you can walk without feeling pain in your ankle.  If you have a plaster splint, wear the splint as directed by your caregiver. Do not rest it on anything harder than a pillow for the first 24 hours. Do not put weight on it. Do not get it wet. You may take it off to take a shower or bath.  You may have been given an elastic bandage to wear around your ankle to provide support. If the elastic bandage is too tight (you have numbness or tingling in your foot or your foot becomes cold and blue), adjust the bandage to make it comfortable.  If you have an air splint, you may blow more air into it or let air out to make it more comfortable. You may take your splint off at night and before taking a shower or bath. Wiggle your toes in the splint several times per day to decrease swelling. SEEK MEDICAL CARE IF:   You have rapidly increasing  bruising or swelling.  Your toes feel extremely cold or you lose feeling in your foot.  Your pain is not relieved with medicine. SEEK IMMEDIATE MEDICAL CARE IF:  Your toes are numb or blue.  You have severe pain that is increasing. MAKE SURE YOU:   Understand these instructions.  Will watch your condition.  Will get help right away if you are not doing well or get worse. Document Released: 07/12/2005 Document Revised: 04/05/2012 Document Reviewed: 07/24/2011 [facility] Patient Information 2014 [place], [place]. RICE: Routine Care for Injuries The routine care of many injuries includes Rest, Ice,  Compression, and Elevation (RICE). HOME CARE INSTRUCTIONS  Rest is needed to allow your body to heal. Routine activities can usually be resumed when comfortable. Injured tendons and bones can take up to 6 weeks to heal. Tendons are the cord-like structures that attach muscle to bone.  Ice following an injury helps keep the swelling down and reduces pain.  Put ice in a plastic bag.  Place a towel between your skin and the bag.  Leave the ice on for 15-20 minutes, 03-04 times a day. Do this while awake, for the first 24 to 48 hours. After that, continue as directed by your caregiver.  Compression helps keep swelling down. It also gives support and helps with discomfort. If an elastic bandage has been applied, it should be removed and reapplied every 3 to 4 hours. It should not be applied tightly, but firmly enough to keep swelling down. Watch fingers or toes for swelling, bluish discoloration, coldness, numbness, or excessive pain. If any of these problems occur, remove the bandage and reapply loosely. Contact your caregiver if these problems continue.  Elevation helps reduce swelling and decreases pain. With extremities, such as the arms, hands, legs, and feet, the injured area should be placed near or above the level of the heart, if possible. SEEK IMMEDIATE MEDICAL CARE IF:  You have persistent pain and swelling.  You develop redness, numbness, or unexpected weakness.  Your symptoms are getting worse rather than improving after several days. These symptoms may indicate that further evaluation or further X-rays are needed. Sometimes, X-rays may not show a small broken bone (fracture) until 1 week or 10 days later. Make a follow-up appointment with your caregiver. Ask when your X-ray results will be ready. Make sure you get your X-ray results. Document Released: 10/24/2000 Document Revised: 10/04/2011 Document Reviewed: 12/11/2010 [facility] Patient Information 2014 [place], [place].

## 2013-08-13 NOTE — ED Provider Notes (Signed)
CSN: [id number]     Arrival date & time 08/13/13  1504 History   This chart was scribed for non-physician practitioner Antonietta Breach, PA-C working with Babette Relic, MD by Adriana Reams, ED Scribe. This patient was seen in room TR11C/TR11C and the patient's care was started at [facility].  First MD Initiated Contact with Patient 08/13/13 1645     Chief Complaint  Patient presents with  . Ankle Pain    The history is provided by the patient. No language interpreter was used.   HPI Comments: Troy Holden is a 63 y.o. male with hx of foot drop, who presents to the Emergency Department complaining of sudden onset, moderate, waxing and waning left ankle pain that radiates up the front of his leg and began yesterday when he twisted his ankle. He describes the pain as stabbing and shooting. Movement and weight bearing make the pain worse. He has not tried any OTC medication for pain. He denies any other symptoms.   Past Medical History  Diagnosis Date  . Heart disease   . Cancer     lymphoma  . HIV (human immunodeficiency virus infection)   . Anemia   . Anxiety   . Blood transfusion   . Cataract   . Depression   . GERD (gastroesophageal reflux disease)   . Thyroid disease     low thyroid  . 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 HIV (human immunodeficiency virus infection) 04/20/2011  . Dementia 04/20/2011  . CAD (coronary artery disease) 04/20/2011  . Hypothyroidism 04/20/2011  . Dyslipidemia 04/20/2011  . Hypertension   . Dysrhythmia   . Shortness of breath   . Pneumonia   . Hepatitis    Past Surgical History  Procedure Laterality Date  . Cardiac surgery    . Abdominal surgery    . Colonoscopy     Family History  Problem Relation Age of Onset  . Colon cancer Mother   . Colon cancer Brother    History  Substance Use Topics  . Smoking status: Current Some Day Smoker -- 0.25 packs/day for 10 years    Types: Cigarettes     Last Attempt to Quit: 03/30/2011  . Smokeless tobacco: Not on file  . Alcohol Use: Yes     Comment: rarely    Review of Systems  Musculoskeletal: Positive for arthralgias.  All other systems reviewed and are negative.    Allergies  Lidocaine hcl and Trazodone and nefazodone  Home Medications   Current Outpatient Rx  Name  Route  Sig  Dispense  Refill  . clopidogrel (PLAVIX) 75 MG tablet   Oral   Take 75 mg by mouth daily.           . dolutegravir (TIVICAY) 50 MG tablet   Oral   Take 50 mg by mouth daily.         Marland Kitchen emtricitabine-tenofovir (TRUVADA) 200-300 MG per tablet   Oral   Take 1 tablet by mouth daily.         Marland Kitchen ezetimibe (ZETIA) 10 MG tablet   Oral   Take 10 mg by mouth daily.         Marland Kitchen gabapentin (NEURONTIN) 300 MG capsule   Oral   Take 300 mg by mouth 3 (three) times daily.         Marland Kitchen lisinopril (PRINIVIL,ZESTRIL) 5 MG tablet   Oral   Take 5 mg by mouth daily.         Marland Kitchen  metoprolol succinate (TOPROL-XL) 25 MG 24 hr tablet   Oral   Take 12.5 mg by mouth daily.           . naproxen (NAPROSYN) 250 MG tablet   Oral   Take 250 mg by mouth 2 (two) times daily as needed (for pain).         . nitroGLYCERIN (NITROSTAT) 0.4 MG SL tablet   Sublingual   Place 0.4 mg under the tongue every 5 (five) minutes as needed for chest pain.          . pantoprazole (PROTONIX) 40 MG tablet   Oral   Take 40 mg by mouth daily.         . pravastatin (PRAVACHOL) 40 MG tablet   Oral   Take 40 mg by mouth daily.         Marland Kitchen zolpidem (AMBIEN) 10 MG tablet   Oral   Take 10 mg by mouth at bedtime as needed for sleep.         Marland Kitchen oxyCODONE-acetaminophen (PERCOCET/ROXICET) 5-325 MG per tablet   Oral   Take 1 tablet by mouth every 4 (four) hours as needed for severe pain.   7 tablet   0    BP 94/59  Pulse 85  Temp(Src) 97.7 F (36.5 C) (Oral)  Resp 18  Wt 164 lb (74.39 kg)  SpO2 99%  Physical Exam  Nursing note and vitals  reviewed. Constitutional: He is oriented to person, place, and time. He appears well-developed and well-nourished. No distress.  HENT:  Head: Normocephalic and atraumatic.  Eyes: Conjunctivae and EOM are normal. No scleral icterus.  Neck: Normal range of motion.  Cardiovascular: Normal rate, regular rhythm and intact distal pulses.   Pulses:      Dorsalis pedis pulses are 2+ on the right side, and 2+ on the left side.       Posterior tibial pulses are 2+ on the right side, and 2+ on the left side.  Pulmonary/Chest: Effort normal. No respiratory distress.  Musculoskeletal: Normal range of motion. He exhibits tenderness.       Left ankle: He exhibits normal range of motion, no deformity, no laceration and normal pulse. Tenderness. Achilles tendon normal.       Left lower leg: Normal.       Left foot: Normal.       Feet:  Neurological: He is alert and oriented to person, place, and time.  No numbness, tingling or weakness of the affected extremity. Patient able to wiggle all toes.  Skin: Skin is warm and dry. No rash noted. He is not diaphoretic. No erythema. No pallor.  Psychiatric: He has a normal mood and affect. His behavior is normal.    ED Course  Procedures (including critical care time) DIAGNOSTIC STUDIES: Oxygen Saturation is 99% on RA, normal by my interpretation.    COORDINATION OF CARE: 5:01 PM Discussed treatment plan which includes ASO ankle splint, crutches and pain medication with pt at bedside and pt agreed to plan. Discussed imaging. RICE protocol discussed. Advised pt to follow up with orthopedist, referral given.   Labs Review Labs Reviewed - No data to display Imaging Review Dg Ankle Complete Left  08/13/2013   CLINICAL DATA:  Pain.  EXAM: LEFT ANKLE COMPLETE - 3+ VIEW  COMPARISON:  None.  FINDINGS: A subtle avulsion fracture, age undetermined, noted from the tip of the lateral malleolus. Degenerative changes are noted about the ankle. Mild calcaneal spurring is  present. No other  focal abnormalities identified.  IMPRESSION: 1. Tiny avulsion fracture from the tip of the lateral malleolus. Age undetermined. 2. Degenerative changes left ankle. 3. Mild calcaneal spurring.   Electronically Signed   By: Marcello Moores  Register   On: 08/13/2013 16:40    EKG Interpretation   None       MDM   1. Sprain of ankle     Uncomplicated left ankle sprain secondary to twisting of the ankle while walking dog yesterday. Patient is neurovascularly intact. No obvious crepitus, swelling, or deformities. Gross sensation intact in left foot. X-ray shows tiny avulsion fracture to the tip of the lateral malleolus; there is some mild associated point tenderness. No evidence of septic joint. Patient placed in ASO ankle and given crutches. Advised RICE and orthopedic referral. Return precautions provided and patient agreeable to plan with no unaddressed concerns.  I personally performed the services described in this documentation, which was scribed in my presence. The recorded information has been reviewed and is accurate.     Antonietta Breach, PA-C 08/13/13 1726

## 2013-08-16 NOTE — ED Provider Notes (Signed)
Medical screening examination/treatment/procedure(s) were performed by non-physician practitioner and as supervising physician I was immediately available for consultation/collaboration.  Babette Relic, MD 08/16/13 [phone]

## 2013-09-13 ENCOUNTER — Emergency Department (HOSPITAL_COMMUNITY)
Admission: EM | Admit: 2013-09-13 | Discharge: 2013-09-13 | Disposition: A | Discharge status: Home / Self Care | Payer: Medicaid Other | Attending: Emergency Medicine | Admitting: Emergency Medicine

## 2013-09-13 ENCOUNTER — Emergency Department (HOSPITAL_COMMUNITY): Payer: Medicaid Other

## 2013-09-13 ENCOUNTER — Encounter (HOSPITAL_COMMUNITY): Payer: Self-pay | Admitting: Emergency Medicine

## 2013-09-13 DIAGNOSIS — S82871A Displaced pilon fracture of right tibia, initial encounter for closed fracture: Secondary | ICD-10-CM

## 2013-09-13 DIAGNOSIS — Z21 Asymptomatic human immunodeficiency virus [HIV] infection status: Secondary | ICD-10-CM | POA: Insufficient documentation

## 2013-09-13 DIAGNOSIS — Y929 Unspecified place or not applicable: Secondary | ICD-10-CM | POA: Insufficient documentation

## 2013-09-13 DIAGNOSIS — Z8719 Personal history of other diseases of the digestive system: Secondary | ICD-10-CM | POA: Insufficient documentation

## 2013-09-13 DIAGNOSIS — Z9889 Other specified postprocedural states: Secondary | ICD-10-CM | POA: Insufficient documentation

## 2013-09-13 DIAGNOSIS — F329 Major depressive disorder, single episode, unspecified: Secondary | ICD-10-CM | POA: Insufficient documentation

## 2013-09-13 DIAGNOSIS — E785 Hyperlipidemia, unspecified: Secondary | ICD-10-CM | POA: Insufficient documentation

## 2013-09-13 DIAGNOSIS — Z8701 Personal history of pneumonia (recurrent): Secondary | ICD-10-CM | POA: Insufficient documentation

## 2013-09-13 DIAGNOSIS — F0390 Unspecified dementia without behavioral disturbance: Secondary | ICD-10-CM | POA: Insufficient documentation

## 2013-09-13 DIAGNOSIS — H269 Unspecified cataract: Secondary | ICD-10-CM | POA: Insufficient documentation

## 2013-09-13 DIAGNOSIS — F411 Generalized anxiety disorder: Secondary | ICD-10-CM | POA: Insufficient documentation

## 2013-09-13 DIAGNOSIS — I10 Essential (primary) hypertension: Secondary | ICD-10-CM | POA: Insufficient documentation

## 2013-09-13 DIAGNOSIS — Z8619 Personal history of other infectious and parasitic diseases: Secondary | ICD-10-CM | POA: Insufficient documentation

## 2013-09-13 DIAGNOSIS — F3289 Other specified depressive episodes: Secondary | ICD-10-CM | POA: Insufficient documentation

## 2013-09-13 DIAGNOSIS — Z79899 Other long term (current) drug therapy: Secondary | ICD-10-CM | POA: Insufficient documentation

## 2013-09-13 DIAGNOSIS — F17200 Nicotine dependence, unspecified, uncomplicated: Secondary | ICD-10-CM | POA: Insufficient documentation

## 2013-09-13 DIAGNOSIS — Z7902 Long term (current) use of antithrombotics/antiplatelets: Secondary | ICD-10-CM | POA: Insufficient documentation

## 2013-09-13 DIAGNOSIS — I2510 Atherosclerotic heart disease of native coronary artery without angina pectoris: Secondary | ICD-10-CM | POA: Insufficient documentation

## 2013-09-13 DIAGNOSIS — Z862 Personal history of diseases of the blood and blood-forming organs and certain disorders involving the immune mechanism: Secondary | ICD-10-CM | POA: Insufficient documentation

## 2013-09-13 DIAGNOSIS — S82109A Unspecified fracture of upper end of unspecified tibia, initial encounter for closed fracture: Secondary | ICD-10-CM | POA: Insufficient documentation

## 2013-09-13 DIAGNOSIS — K219 Gastro-esophageal reflux disease without esophagitis: Secondary | ICD-10-CM | POA: Insufficient documentation

## 2013-09-13 DIAGNOSIS — Y9301 Activity, walking, marching and hiking: Secondary | ICD-10-CM | POA: Insufficient documentation

## 2013-09-13 DIAGNOSIS — Z87898 Personal history of other specified conditions: Secondary | ICD-10-CM | POA: Insufficient documentation

## 2013-09-13 DIAGNOSIS — W010XXA Fall on same level from slipping, tripping and stumbling without subsequent striking against object, initial encounter: Secondary | ICD-10-CM | POA: Insufficient documentation

## 2013-09-13 MED ORDER — OXYCODONE-ACETAMINOPHEN 5-325 MG PO TABS: 1.0000 | ORAL_TABLET | ORAL | Status: DC | PRN

## 2013-09-13 MED ORDER — IBUPROFEN 400 MG PO TABS
800.0000 mg | ORAL_TABLET | Freq: Once | ORAL | Status: AC
  Administered 2013-09-13: 800 mg via ORAL
  Filled 2013-09-13: qty 2

## 2013-09-13 NOTE — ED Provider Notes (Signed)
CSN: [id number]     Arrival date & time 09/13/13  1116 History  This chart was scribed for Domenic Moras, PA working with Tanna Furry, MD by Roxan Diesel, ED Scribe. This patient was seen in room TR10C/TR10C and the patient's care was started at 11:22 AM.   Chief Complaint  Patient presents with  . Fall    The history is provided by the patient. No language interpreter was used.    HPI Comments: Troy Holden is a 63 y.o. male brought in by EMS to the Emergency Department complaining of a fall that occurred pta.  Pt reports he was walking his dog this morning and slipped on ice and landed on his right knee.  He denies head impact or LOC.  He denies dizziness or any other symptoms prior to his fall.  He states that he immediately developed constant, moderate-to-severe pain to the medial right knee.  Pain is worsened by weight-bearing, flexion and extension.  He reports he has been unable to stand on his own since the fall and had to call out to his neighbor for help.  He also notes some radiation of pain into his ankle when he moves the leg.  He has not attempted to treat pain pta.  He denies injury to any other area.     Past Medical History  Diagnosis Date  . Heart disease   . Cancer     lymphoma  . HIV (human immunodeficiency virus infection)   . Anemia   . Anxiety   . Blood transfusion   . Cataract   . Depression   . GERD (gastroesophageal reflux disease)   . Thyroid disease     low thyroid  . 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 HIV (human immunodeficiency virus infection) 04/20/2011  . Dementia 04/20/2011  . CAD (coronary artery disease) 04/20/2011  . Hypothyroidism 04/20/2011  . Dyslipidemia 04/20/2011  . Hypertension   . Dysrhythmia   . Shortness of breath   . Pneumonia   . Hepatitis     Past Surgical History  Procedure Laterality Date  . Cardiac surgery    . Abdominal surgery    . Colonoscopy       Family History  Problem Relation Age of Onset  . Colon cancer Mother   . Colon cancer Brother     History  Substance Use Topics  . Smoking status: Current Some Day Smoker -- 0.25 packs/day for 10 years    Types: Cigarettes    Last Attempt to Quit: 03/30/2011  . Smokeless tobacco: Not on file  . Alcohol Use: Yes     Comment: rarely     Review of Systems  Musculoskeletal: Positive for arthralgias (right knee).  Neurological: Negative for dizziness and syncope.      Allergies  Lidocaine hcl and Trazodone and nefazodone  Home Medications   Current Outpatient Rx  Name  Route  Sig  Dispense  Refill  . clopidogrel (PLAVIX) 75 MG tablet   Oral   Take 75 mg by mouth daily.           . dolutegravir (TIVICAY) 50 MG tablet   Oral   Take 50 mg by mouth daily.         Marland Kitchen emtricitabine-tenofovir (TRUVADA) 200-300 MG per tablet   Oral   Take 1 tablet by mouth daily.         Marland Kitchen ezetimibe (ZETIA) 10 MG tablet  Oral   Take 10 mg by mouth daily.         Marland Kitchen gabapentin (NEURONTIN) 300 MG capsule   Oral   Take 300 mg by mouth 3 (three) times daily.         Marland Kitchen lisinopril (PRINIVIL,ZESTRIL) 5 MG tablet   Oral   Take 5 mg by mouth daily.         . metoprolol succinate (TOPROL-XL) 25 MG 24 hr tablet   Oral   Take 12.5 mg by mouth daily.           . naproxen (NAPROSYN) 250 MG tablet   Oral   Take 250 mg by mouth 2 (two) times daily as needed (for pain).         . nitroGLYCERIN (NITROSTAT) 0.4 MG SL tablet   Sublingual   Place 0.4 mg under the tongue every 5 (five) minutes as needed for chest pain.          Marland Kitchen oxyCODONE-acetaminophen (PERCOCET/ROXICET) 5-325 MG per tablet   Oral   Take 1 tablet by mouth every 4 (four) hours as needed for severe pain.   7 tablet   0   . pantoprazole (PROTONIX) 40 MG tablet   Oral   Take 40 mg by mouth daily.         . pravastatin (PRAVACHOL) 40 MG tablet   Oral   Take 40 mg by mouth daily.         Marland Kitchen zolpidem  (AMBIEN) 10 MG tablet   Oral   Take 10 mg by mouth at bedtime as needed for sleep.          BP 137/79  Pulse 67  Resp 20  SpO2 99%  Physical Exam  Nursing note and vitals reviewed. Constitutional: He is oriented to person, place, and time. He appears well-developed and well-nourished. No distress.  HENT:  Head: Normocephalic and atraumatic.  Eyes: EOM are normal.  Neck: Neck supple. No tracheal deviation present.  Cardiovascular: Normal rate and intact distal pulses.   Pulmonary/Chest: Effort normal. No respiratory distress.  Musculoskeletal: He exhibits tenderness. He exhibits no edema.       Right hip: Normal.       Right knee: He exhibits ecchymosis. He exhibits no deformity. Tenderness found.       Right ankle: Normal.  Tenderness to medial aspect of right knee.  Faint bruising noted, without any obvious deformity.  Patella seems to be in the right location.  Pain with flexion and extension.  No joint laxity.  Pain with varus and valgus maneuver.    Neurological: He is alert and oriented to person, place, and time.  Sensation is intact to all 4 extremities  Skin: Skin is warm and dry.  Psychiatric: He has a normal mood and affect. His behavior is normal.    ED Course  Procedures (including critical care time)  DIAGNOSTIC STUDIES: Oxygen Saturation is 99% on room air, normal by my interpretation.    COORDINATION OF CARE: 11:27 AM-Discussed treatment plan which includes ibuprofen and right knee x-ray with pt at bedside and pt agreed to plan.    12:14 PM X-ray of right knee-demonstrate a mildly depressed medial tibial plateau fracture, with moderate knee joint effusion. This is a closed fx.  Will provide a knee immobilizer, and crutches for support. Patient will follow up with orthopedist for further management. RICE therapy discussed.   Labs Review Labs Reviewed - No data to display  Imaging Review Dg Knee  Complete 4 Views Right  09/13/2013   CLINICAL DATA:  Fall.   Severe knee injury and pain.  EXAM: RIGHT KNEE - COMPLETE 4+ VIEW  COMPARISON:  None.  FINDINGS: Mildly depressed fracture of the medial tibial plateau is seen. Moderate knee joint effusion also noted.  No other fractures are identified. No evidence of dislocation. Peripheral vascular calcification noted.  IMPRESSION: Mildly depressed medial tibial plateau fracture, with moderate knee joint effusion.   Electronically Signed   By: Earle Gell M.D.   On: 09/13/2013 12:05    EKG Interpretation   None       MDM   Final diagnoses:  Closed fracture of right tibial plafond without fibula involvement    BP 137/79  Pulse 67  Temp(Src) 97.1 F (36.2 C) (Oral)  Resp 20  SpO2 99%  I have reviewed nursing notes and vital signs. I personally reviewed the imaging tests through PACS system  I reviewed available ER/hospitalization records thought the EMR   I personally performed the services described in this documentation, which was scribed in my presence. The recorded information has been reviewed and is accurate.     Domenic Moras, PA-C 09/13/13 1219

## 2013-09-13 NOTE — Progress Notes (Signed)
Orthopedic Tech Progress Note Patient Details:  Troy Holden 08-27-1950 [id number] KI and crutches applied to Right LE. Application tolerated well.  Ortho Devices Type of Ortho Device: Knee Immobilizer;Crutches Ortho Device/Splint Location: Right LE Ortho Device/Splint Interventions: Application   Asia R Thompson 09/13/2013, 12:36 PM

## 2013-09-13 NOTE — ED Notes (Signed)
Ortho contacted regarding orders 

## 2013-09-13 NOTE — ED Notes (Signed)
Pt reports slip and fall on ice this AM while walking his dog. Pt reports right knee pain, per EMS with swelling. VSS. Denies neck, back pain.

## 2013-09-13 NOTE — Discharge Instructions (Signed)
Knee Fracture, Adult °A knee fracture is a break in any of the bones of the lower part of the thigh bone, the upper part of the bones of the lower leg, or of the kneecap. When the bones no longer meet the way they are supposed to it is called a dislocation. Sometimes there can be a dislocation along with fractures. °SYMPTOMS  °Symptoms may include pain, swelling, inability to bend the knee, deformity of the knee, and inability to walk.  °DIAGNOSIS  °This problem is usually diagnosed with x-rays. Special studies are sometimes done if a fracture is suspected but cannot be seen on ordinary x-rays. If vessels around the knee are injured, special tests may be done to see what the damage is. °TREATMENT  °· The leg is usually splinted for the first couple of days to allow for swelling. After the swelling is down a cast is put on. Sometimes a cast is put on right away with the sides of the cast cut to allow the knee to swell. If the bones are in place, this may be all that is needed. °· If the bones are out of place, medications for pain are given to allow them to be put back in place. If they are seriously out of place, surgery may be needed to hold the pieces or breaks in place using wires, pins, screws or metal plates. °· Generally most fractures will heal in 4 to 6 weeks. °HOME CARE INSTRUCTIONS  °· Use your crutches as directed. °· To lessen the swelling, keep the injured leg elevated while sitting or lying down. °· Apply ice to the injury for 15-20 minutes, 03-04 times per day while awake for 2 days. Put the ice in a plastic bag and place a thin towel between the bag of ice and your cast. °· If you have a plaster or fiberglass cast: °· Do not try to scratch the skin under the cast using sharp or pointed objects. °· Check the skin around the cast every day. You may put lotion on any red or sore areas. °· Keep your cast dry and clean. °· If you have a plaster splint: °· Wear the splint as directed. °· You may loosen the  elastic around the splint if your toes become numb, tingle, or turn cold or blue. °· Do not put pressure on any part of your cast or splint; it may break. Rest your cast only on a pillow the first 24 hours until it is fully hardened. °· Your cast or splint can be protected during bathing with a plastic bag. Do not lower the cast or splint into water. °· Only take over-the-counter or prescription medicines for pain, discomfort, or fever as directed by your caregiver. °· See your caregiver soon if your cast gets damaged or breaks. °· It is very important to keep all follow up appointments. Not following up as directed may result in a worsening of your condition or a failure of the fracture to heal properly. °SEEK IMMEDIATE MEDICAL CARE IF: °· You have continued severe pain. °· You have more swelling than you did before the cast was put on. °· The area below the fracture becomes painful. °· Your skin or toenails below the injury turn blue or gray, or feel cold or numb. °· There is drainage coming from under the cast. °· New, unexplained symptoms develop (drugs used in treatment may produce side effects). °MAKE SURE YOU:  °· Understand these instructions. °· Will watch your condition. °· Will   get help right away if you are not doing well or get worse. Document Released: 05/25/2006 Document Revised: 10/04/2011 Document Reviewed: 06/26/2007 [facility] Patient Information 2014 [place], [place].

## 2013-09-17 NOTE — ED Provider Notes (Signed)
Medical screening examination/treatment/procedure(s) were performed by non-physician practitioner and as supervising physician I was immediately available for consultation/collaboration.  EKG Interpretation   None         Sage Kopera, MD 09/17/13 0647 

## 2013-09-17 NOTE — Progress Notes (Signed)
Incoming call from Patient who reports he was in the ED on 09/13/13 secondary to falling on the Ice and sustained a closed fracture( Tibial Plafond) Patient reports he was discharged home with an orthopedic follow up for 2/ 24/ 15 ,patient reports he would like CNA home health services.This  CM reports she will call patient back once she has some resources.Patient reports he understands and will await CM return call.

## 2013-09-17 NOTE — Progress Notes (Signed)
CM called patient back because it had been several days since discharge from ED CM was unable to set up [facility] services.CM spoke with [facility] care Liaison who confirmed the same) CM explained to patient he had left ED on 2/19 and CM was unable to provide services( patient  Has no skilled need) Patient reports he understands.Patient provided with contact numbers for local private duty care / sitter services so he can arrange  Some CNA support.Patient receptive to resources and reports he will follow up with Orthopedics tomorrow and arrange a private sitter today.Patient reports He will call some friends for additional support.No Further CM needs identified.

## 2013-12-20 ENCOUNTER — Emergency Department (HOSPITAL_COMMUNITY): Payer: Medicaid Other

## 2013-12-20 ENCOUNTER — Encounter (HOSPITAL_COMMUNITY): Payer: Self-pay | Admitting: Emergency Medicine

## 2013-12-20 ENCOUNTER — Emergency Department (HOSPITAL_COMMUNITY)
Admission: EM | Admit: 2013-12-20 | Discharge: 2013-12-20 | Disposition: A | Discharge status: Other Acute Inpatient Hospital | Payer: Medicaid Other | Attending: Emergency Medicine | Admitting: Emergency Medicine

## 2013-12-20 DIAGNOSIS — E039 Hypothyroidism, unspecified: Secondary | ICD-10-CM | POA: Insufficient documentation

## 2013-12-20 DIAGNOSIS — Z21 Asymptomatic human immunodeficiency virus [HIV] infection status: Secondary | ICD-10-CM | POA: Insufficient documentation

## 2013-12-20 DIAGNOSIS — K219 Gastro-esophageal reflux disease without esophagitis: Secondary | ICD-10-CM | POA: Diagnosis not present

## 2013-12-20 DIAGNOSIS — Z862 Personal history of diseases of the blood and blood-forming organs and certain disorders involving the immune mechanism: Secondary | ICD-10-CM | POA: Insufficient documentation

## 2013-12-20 DIAGNOSIS — K56609 Unspecified intestinal obstruction, unspecified as to partial versus complete obstruction: Secondary | ICD-10-CM

## 2013-12-20 DIAGNOSIS — F411 Generalized anxiety disorder: Secondary | ICD-10-CM | POA: Insufficient documentation

## 2013-12-20 DIAGNOSIS — Z8619 Personal history of other infectious and parasitic diseases: Secondary | ICD-10-CM | POA: Diagnosis not present

## 2013-12-20 DIAGNOSIS — F0390 Unspecified dementia without behavioral disturbance: Secondary | ICD-10-CM | POA: Diagnosis not present

## 2013-12-20 DIAGNOSIS — F17200 Nicotine dependence, unspecified, uncomplicated: Secondary | ICD-10-CM | POA: Insufficient documentation

## 2013-12-20 DIAGNOSIS — R1084 Generalized abdominal pain: Secondary | ICD-10-CM | POA: Diagnosis present

## 2013-12-20 DIAGNOSIS — Z9889 Other specified postprocedural states: Secondary | ICD-10-CM | POA: Diagnosis not present

## 2013-12-20 DIAGNOSIS — Z79899 Other long term (current) drug therapy: Secondary | ICD-10-CM | POA: Diagnosis not present

## 2013-12-20 DIAGNOSIS — F329 Major depressive disorder, single episode, unspecified: Secondary | ICD-10-CM | POA: Diagnosis not present

## 2013-12-20 DIAGNOSIS — Z8701 Personal history of pneumonia (recurrent): Secondary | ICD-10-CM | POA: Diagnosis not present

## 2013-12-20 DIAGNOSIS — I2510 Atherosclerotic heart disease of native coronary artery without angina pectoris: Secondary | ICD-10-CM | POA: Diagnosis not present

## 2013-12-20 DIAGNOSIS — Z7902 Long term (current) use of antithrombotics/antiplatelets: Secondary | ICD-10-CM | POA: Insufficient documentation

## 2013-12-20 DIAGNOSIS — I10 Essential (primary) hypertension: Secondary | ICD-10-CM | POA: Insufficient documentation

## 2013-12-20 DIAGNOSIS — E785 Hyperlipidemia, unspecified: Secondary | ICD-10-CM | POA: Diagnosis not present

## 2013-12-20 DIAGNOSIS — F3289 Other specified depressive episodes: Secondary | ICD-10-CM | POA: Diagnosis not present

## 2013-12-20 DIAGNOSIS — Z8669 Personal history of other diseases of the nervous system and sense organs: Secondary | ICD-10-CM | POA: Insufficient documentation

## 2013-12-20 DIAGNOSIS — Z8572 Personal history of non-Hodgkin lymphomas: Secondary | ICD-10-CM | POA: Diagnosis not present

## 2013-12-20 LAB — COMPREHENSIVE METABOLIC PANEL
ALT: 13 U/L (ref 0–53)
AST: 14 U/L (ref 0–37)
Albumin: 3.2 g/dL — ABNORMAL LOW (ref 3.5–5.2)
Alkaline Phosphatase: 77 U/L (ref 39–117)
BUN: 23 mg/dL (ref 6–23)
CALCIUM: 9.3 mg/dL (ref 8.4–10.5)
CO2: 23 meq/L (ref 19–32)
CREATININE: 1.07 mg/dL (ref 0.50–1.35)
Chloride: 87 mEq/L — ABNORMAL LOW (ref 96–112)
GFR calc Af Amer: 85 mL/min — ABNORMAL LOW (ref >90)
GFR calc non Af Amer: 73 mL/min — ABNORMAL LOW (ref >90)
Glucose, Bld: 100 mg/dL — ABNORMAL HIGH (ref 70–99)
Potassium: 4.6 mEq/L (ref 3.7–5.3)
SODIUM: 129 meq/L — AB (ref 137–147)
TOTAL PROTEIN: 6.9 g/dL (ref 6.0–8.3)
Total Bilirubin: 0.4 mg/dL (ref 0.3–1.2)

## 2013-12-20 LAB — CBC WITH DIFFERENTIAL/PLATELET
BASOS PCT: 0 % (ref 0–1)
Basophils Absolute: 0 10*3/uL (ref 0.0–0.1)
EOS PCT: 1 % (ref 0–5)
Eosinophils Absolute: 0.1 10*3/uL (ref 0.0–0.7)
HEMATOCRIT: 36.9 % — AB (ref 39.0–52.0)
Hemoglobin: 13 g/dL (ref 13.0–17.0)
LYMPHS PCT: 6 % — AB (ref 12–46)
Lymphs Abs: 0.5 10*3/uL — ABNORMAL LOW (ref 0.7–4.0)
MCH: 32 pg (ref 26.0–34.0)
MCHC: 35.2 g/dL (ref 30.0–36.0)
MCV: 90.9 fL (ref 78.0–100.0)
Monocytes Absolute: 0.6 10*3/uL (ref 0.1–1.0)
Monocytes Relative: 8 % (ref 3–12)
Neutro Abs: 6.1 10*3/uL (ref 1.7–7.7)
Neutrophils Relative %: 85 % — ABNORMAL HIGH (ref 43–77)
PLATELETS: 227 10*3/uL (ref 150–400)
RBC: 4.06 MIL/uL — ABNORMAL LOW (ref 4.22–5.81)
RDW: 14.4 % (ref 11.5–15.5)
WBC: 7.2 10*3/uL (ref 4.0–10.5)

## 2013-12-20 LAB — LACTIC ACID, PLASMA: LACTIC ACID, VENOUS: 0.9 mmol/L (ref 0.5–2.2)

## 2013-12-20 LAB — LIPASE, BLOOD: Lipase: 20 U/L (ref 11–59)

## 2013-12-20 LAB — TROPONIN I

## 2013-12-20 MED ORDER — SODIUM CHLORIDE 0.9 % IV BOLUS (SEPSIS)
1000.0000 mL | Freq: Once | INTRAVENOUS | Status: AC
  Administered 2013-12-20: 1000 mL via INTRAVENOUS

## 2013-12-20 MED ORDER — HYDROMORPHONE HCL PF 1 MG/ML IJ SOLN
1.0000 mg | Freq: Once | INTRAMUSCULAR | Status: AC
  Administered 2013-12-20: 1 mg via INTRAVENOUS
  Filled 2013-12-20: qty 1

## 2013-12-20 MED ORDER — IOHEXOL 300 MG/ML  SOLN
50.0000 mL | Freq: Once | INTRAMUSCULAR | Status: AC | PRN
  Administered 2013-12-20: 50 mL via ORAL

## 2013-12-20 MED ORDER — IOHEXOL 300 MG/ML  SOLN
100.0000 mL | Freq: Once | INTRAMUSCULAR | Status: AC | PRN
  Administered 2013-12-20: 100 mL via INTRAVENOUS

## 2013-12-20 MED ORDER — ONDANSETRON HCL 4 MG/2ML IJ SOLN
4.0000 mg | Freq: Once | INTRAMUSCULAR | Status: AC
  Administered 2013-12-20: 4 mg via INTRAVENOUS
  Filled 2013-12-20: qty 2

## 2013-12-20 NOTE — ED Notes (Signed)
Per pt reports abdominal pain all over starting 2 hours ago. Pt denies n/v, cp, or SOB. Pt has colostomy bag with loose yellow/brown stool. Pt reports normal appearance. Pt denies urinary symptoms.

## 2013-12-20 NOTE — ED Provider Notes (Signed)
CSN: [id number]     Arrival date & time 12/20/13  1601 History   First MD Initiated Contact with Patient 12/20/13 1012     Chief Complaint  Patient presents with  . Abdominal Pain  . Nausea     (Consider location/radiation/quality/duration/timing/severity/associated sxs/prior Treatment) Patient is a 63 y.o. male presenting with abdominal pain.  Abdominal Pain Pain location:  Generalized Pain quality: sharp   Pain radiates to:  Does not radiate Pain severity:  Severe Onset quality:  Gradual Duration:  2 hours Timing:  Constant Progression:  Unchanged Chronicity:  New Context comment:  Surgery two weeks ago to remove colon tumor Relieved by: improved by oxycontin transiently. Worsened by:  Nothing tried Associated symptoms: nausea   Associated symptoms: no chest pain, no diarrhea, no fever, no shortness of breath and no vomiting     Past Medical History  Diagnosis Date  . Heart disease   . Cancer     lymphoma  . HIV (human immunodeficiency virus infection)   . Anemia   . Anxiety   . Blood transfusion   . Cataract   . Depression   . GERD (gastroesophageal reflux disease)   . Thyroid disease     low thyroid  . 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 HIV (human immunodeficiency virus infection) 04/20/2011  . Dementia 04/20/2011  . CAD (coronary artery disease) 04/20/2011  . Hypothyroidism 04/20/2011  . Dyslipidemia 04/20/2011  . Hypertension   . Dysrhythmia   . Shortness of breath   . Pneumonia   . Hepatitis    Past Surgical History  Procedure Laterality Date  . Cardiac surgery    . Abdominal surgery    . Colonoscopy    . Tumor removal      intestine   Family History  Problem Relation Age of Onset  . Colon cancer Mother   . Colon cancer Brother    History  Substance Use Topics  . Smoking status: Current Some Day Smoker -- 0.25 packs/day for 10 years    Types: Cigarettes    Last Attempt to Quit:  03/30/2011  . Smokeless tobacco: Not on file  . Alcohol Use: No    Review of Systems  Constitutional: Negative for fever.  Respiratory: Negative for shortness of breath.   Cardiovascular: Negative for chest pain.  Gastrointestinal: Positive for nausea and abdominal pain. Negative for vomiting and diarrhea.  All other systems reviewed and are negative.     Allergies  Lidocaine hcl and Trazodone and nefazodone  Home Medications   Prior to Admission medications   Medication Sig Start Date End Date Taking? Authorizing Provider  clopidogrel (PLAVIX) 75 MG tablet Take 75 mg by mouth daily.     Yes Historical Provider, MD  dolutegravir (TIVICAY) 50 MG tablet Take 50 mg by mouth daily.   Yes Historical Provider, MD  emtricitabine-tenofovir (TRUVADA) 200-300 MG per tablet Take 1 tablet by mouth daily.   Yes Historical Provider, MD  gabapentin (NEURONTIN) 300 MG capsule Take 300 mg by mouth 3 (three) times daily.   Yes Historical Provider, MD  levothyroxine (SYNTHROID, LEVOTHROID) 112 MCG tablet Take 112 mcg by mouth daily before breakfast.   Yes Historical Provider, MD  lisinopril (PRINIVIL,ZESTRIL) 5 MG tablet Take 5 mg by mouth daily.   Yes Historical Provider, MD  metoprolol succinate (TOPROL-XL) 25 MG 24 hr tablet Take 12.5 mg by mouth daily.     Yes Historical Provider, MD  naproxen (NAPROSYN) 250 MG tablet Take 250 mg by mouth 2 (two) times daily as needed (for pain).   Yes Historical Provider, MD  oxyCODONE-acetaminophen (PERCOCET/ROXICET) 5-325 MG per tablet Take 1 tablet by mouth every 4 (four) hours as needed for severe pain. 09/13/13  Yes Domenic Moras, PA-C  pantoprazole (PROTONIX) 40 MG tablet Take 40 mg by mouth daily.   Yes Historical Provider, MD  pravastatin (PRAVACHOL) 40 MG tablet Take 40 mg by mouth daily.   Yes Historical Provider, MD  sulfamethoxazole-trimethoprim (BACTRIM DS,SEPTRA DS) 800-160 MG per tablet Take 1 tablet by mouth 3 (three) times a week.   Yes Historical  Provider, MD  zolpidem (AMBIEN) 10 MG tablet Take 10 mg by mouth at bedtime as needed for sleep.   Yes Historical Provider, MD  nitroGLYCERIN (NITROSTAT) 0.4 MG SL tablet Place 0.4 mg under the tongue every 5 (five) minutes as needed for chest pain.     Historical Provider, MD   BP 128/79  Pulse 86  Temp(Src) 97.8 F (36.6 C) (Oral)  Resp 17  SpO2 96% Physical Exam  Nursing note and vitals reviewed. Constitutional: He is oriented to person, place, and time. He appears well-developed and well-nourished. No distress.  Thin  HENT:  Head: Normocephalic and atraumatic.  Mouth/Throat: Oropharynx is clear and moist.  Eyes: Conjunctivae are normal. Pupils are equal, round, and reactive to light. No scleral icterus.  Neck: Neck supple.  Cardiovascular: Normal rate, regular rhythm, normal heart sounds and intact distal pulses.   No murmur heard. Pulmonary/Chest: Effort normal and breath sounds normal. No stridor. No respiratory distress. He has no wheezes. He has no rales.  Abdominal: Soft. He exhibits no distension. There is generalized tenderness. There is no rigidity, no guarding and no CVA tenderness.  Ostomy in place with pink stoma and brown stool.  Musculoskeletal: Normal range of motion. He exhibits no edema.  Neurological: He is alert and oriented to person, place, and time.  Skin: Skin is warm and dry. No rash noted.  Psychiatric: He has a normal mood and affect. His behavior is normal.    ED Course  Procedures (including critical care time) Labs Review Labs Reviewed  CBC WITH DIFFERENTIAL - Abnormal; Notable for the following:    RBC 4.06 (*)    HCT 36.9 (*)    Neutrophils Relative % 85 (*)    Lymphocytes Relative 6 (*)    Lymphs Abs 0.5 (*)    All other components within normal limits  COMPREHENSIVE METABOLIC PANEL - Abnormal; Notable for the following:    Sodium 129 (*)    Chloride 87 (*)    Glucose, Bld 100 (*)    Albumin 3.2 (*)    GFR calc non Af Amer 73 (*)     GFR calc Af Amer 85 (*)    All other components within normal limits  LIPASE, BLOOD  LACTIC ACID, PLASMA  TROPONIN I  URINALYSIS, ROUTINE W REFLEX MICROSCOPIC    Imaging Review Ct Abdomen Pelvis W Contrast  12/20/2013   CLINICAL DATA:  Abdominal pain radiating to the chest, history of recent partial colectomy for colon / rectal cancer (11/2012)  EXAM: CT ABDOMEN AND PELVIS WITH CONTRAST  TECHNIQUE: Multidetector CT imaging of the abdomen and pelvis was performed using the standard protocol following bolus administration of intravenous contrast.  CONTRAST:  126mL OMNIPAQUE IOHEXOL 300 MG/ML  SOLN  COMPARISON:  Pelvic CT - 01/03/2013; CT of the abdomen and pelvis - 10/05/2011 ; AP pelvis radiographs -  04/23/2011 ; abdominal ultrasound - 10/05/2011  FINDINGS: The patient has undergone a diverting temporary loop ileostomy within the right lower abdominal quadrant. The terminal ileum and colon is expectedly decompressed distal to the diverting ileostomy. There is marked distension of the upstream loops of small bowel with apparent transition point located within the right lower abdomen (representative coronal images 34 through 41, series 5).  There are multiple ill-defined foci of extraluminal air adjacent to the rectum (representative images 84, 87 and 91, series 2). Foci of air are noted to extend about the tip of the coccyx (sagittal image 69, series 6). There is a minimal amount of free fluid within the pelvis (image 80, series 2) without associated peripheral enhancement to suggest a defined abscess. .  Normal hepatic contour. There is a large approximately 2.1 x 1.9 cm laminated gallstone within the neck when otherwise normal-appearing gallbladder. No definite intra or extrahepatic deviated to dilatation. There is a trace amount of ascites noted adjacent to the right lobe of the liver.  There is symmetric enhancement and excretion of the bilateral kidneys. Interval development of mild to moderate  bilateral pelvicaliectasis, possibly physiologic secondary to marked distention of the urinary bladder. No discrete renal stones. No perinephric stranding. No discrete renal lesions.  Normal appearance of the bilateral adrenal glands and pancreas. Dystrophic calcifications are again noted about the peripheral aspect of the splenic cortex, likely the sequela of remote injury.  Limited visualization a lower thorax demonstrates overall improved aeration of the bilateral lung bases. There are multiple ill-defined nodules within left lower lobe several which appear to have a tree-in-bud configuration (representative image 6, series 4). There is an approximately 0.9 cm ground-glass nodule in the imaged left lower lobe which is incompletely imaged (image 1, series 4). No pleural effusion.  Normal heart size.  No pericardial effusion.  No acute or aggressive osseous abnormalities. Grossly unchanged appearance of suspected osteochondroma arising from the posterior aspect of the acetabulum (representative image 76, series 2). Moderate DDD of L5-S1 with disc space height loss, endplate irregularity and sclerosis.  IMPRESSION: 1. 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 Note, several foci of air extend to abut the tip of the coccyx and as such, developing osteomyelitis is not excluded on the basis of this examination. There is a minimal amount of free fluid within the pelvis however this is without associated peripheral wall enhancement to suggest a defined / drainable abscess. Further evaluation could be performed with a pelvic MRI as clinically indicated. 2. Interval creation of a diverting loop ileostomy within the right lower quadrant with development of a high-grade small bowel obstruction within the right lower quadrant. The exact etiology of this obstruction is not depicted on this examination and thus is presumably secondary to adhesions.  3. Marked distention of the urinary bladder with presumably physiologic bilateral mild to moderate pelvicaliectasis. This patient may benefit from placement of a Foley catheter. 4. Improved aeration of the imaged lower lungs with persistent ill-defined tree and bud nodules within the imaged left lower lobe - nonspecific though could be indicative of aspiration and/or airways disseminated infection (including atypical etiologies). Clinical correlation is advised. 5. Note is made of an approximately 0.9 cm ground-glass nodule within the left lower lobe which is incompletely imaged. Given recent history of colon cancer, further evaluation with nonemergent chest CT is recommended. 6. Grossly unchanged large (approximately 2.1 cm) laminated gallstone within an otherwise normal-appearing gallbladder. Critical  Value/emergent results were called by telephone at the time of interpretation on 12/20/2013 at 1:24 PM to Dr. Serita Grit , who verbally acknowledged these results.   Electronically Signed   By: Sandi Mariscal M.D.   On: 12/20/2013 13:28  All radiology studies independently viewed by me.      EKG Interpretation   Date/Time:  Thursday Dec 20 2013 10:50:00 EDT Ventricular Rate:  83 PR Interval:  149 QRS Duration: 104 QT Interval:  370 QTC Calculation: 435 R Axis:   -37 Text Interpretation:  Sinus rhythm Left axis deviation ST elevation,  consider anterior injury compared to prior, incomplete RBBB not as  evident.   Nonspecific ST changes seen on prior ekg's Confirmed by [facility]   MD, TREY ([id number]) on 12/20/2013 12:07:48 PM      MDM   Final diagnoses:  SBO (small bowel obstruction)    63 yo male with history of recent abdominal surgery presenting with abdominal pain starting this morning.  CT concerning for high grade SBO.  Symptoms controlled with IV dilaudid and Zofran.  I discussed his case with Dr. Lovett Sox (Surgery at [facility]) who operated on him a few weeks ago.  He will accept Mr.  Marney in transfer to [facility].      Houston Siren III, MD 12/20/13 [phone]

## 2013-12-20 NOTE — ED Notes (Signed)
Pt belongings sent with pt including clothes, cell phone, charger and glasses.

## 2013-12-20 NOTE — Progress Notes (Signed)
  CARE MANAGEMENT ED NOTE 12/20/2013  Patient:  Troy Holden, Troy Holden   Account Number:  [id number]  Date Initiated:  12/20/2013  Documentation initiated by:  Westly Pam Assessment:   63 yr old male [place] access covered pt  from home.  Pt c/o abdominal pain radiating to chest.  Pt had recent partial colectomy for colon cancer.  Pt surgery done on 5/14.  Pt has colostomy to rlq.     Subjective/Objective Assessment Detail:   Troy Holden is pcp listed on medicaid card pt reports not having seen pcp yet as of 12/20/13  Has seen Dr Gloriann Loan in [place] prefers MD remain on MD list for him     Action/Plan:   updateed epic   Action/Plan Detail:   Anticipated DC Date:       Status Recommendation to Physician:   Result of Recommendation:    Other ED Services  Consult Working [place]  Other  Outpatient Services - Pt will follow up  PCP issues    Choice offered to / List presented to:            Status of service:  Completed, signed off  ED Comments:   ED Comments Detail:

## 2013-12-20 NOTE — ED Notes (Signed)
Bed: RESA Expected date:  Expected time:  Means of arrival:  Comments: EMS - Abd pain, CP, CA

## 2013-12-20 NOTE — ED Notes (Signed)
Lab label for Troponin sent down as soon as it was ordered.  Never ran by lab.  Got a phone call to recollect since it was too late to add it on.  Label was sent down over an hour prior to phone call.

## 2013-12-20 NOTE — ED Notes (Signed)
Per EMS, pt from home.  Pt c/o abdominal pain radiating to chest.  Pt had recent partial colectomy for colon cancer.  Pt surgery done on 5/14.  Pt has colostomy to rlq.  Hx of cardiac cath and takes nitro for intermediate chest pain. Pt took oxycodone and zofran at home prior to arrival.   146/85, hr 96. Resp 16, sats 98% ra.

## 2014-03-20 ENCOUNTER — Encounter (HOSPITAL_COMMUNITY): Payer: Self-pay | Admitting: Emergency Medicine

## 2014-03-20 ENCOUNTER — Emergency Department (HOSPITAL_COMMUNITY)
Admission: EM | Admit: 2014-03-20 | Discharge: 2014-03-20 | Disposition: A | Discharge status: Home / Self Care | Payer: Medicaid Other | Attending: Emergency Medicine | Admitting: Emergency Medicine

## 2014-03-20 DIAGNOSIS — Z8701 Personal history of pneumonia (recurrent): Secondary | ICD-10-CM | POA: Diagnosis not present

## 2014-03-20 DIAGNOSIS — Z79899 Other long term (current) drug therapy: Secondary | ICD-10-CM | POA: Insufficient documentation

## 2014-03-20 DIAGNOSIS — Z87898 Personal history of other specified conditions: Secondary | ICD-10-CM | POA: Insufficient documentation

## 2014-03-20 DIAGNOSIS — F17200 Nicotine dependence, unspecified, uncomplicated: Secondary | ICD-10-CM | POA: Diagnosis not present

## 2014-03-20 DIAGNOSIS — Z7902 Long term (current) use of antithrombotics/antiplatelets: Secondary | ICD-10-CM | POA: Insufficient documentation

## 2014-03-20 DIAGNOSIS — K219 Gastro-esophageal reflux disease without esophagitis: Secondary | ICD-10-CM | POA: Diagnosis not present

## 2014-03-20 DIAGNOSIS — F411 Generalized anxiety disorder: Secondary | ICD-10-CM | POA: Diagnosis not present

## 2014-03-20 DIAGNOSIS — F0390 Unspecified dementia without behavioral disturbance: Secondary | ICD-10-CM | POA: Diagnosis not present

## 2014-03-20 DIAGNOSIS — E785 Hyperlipidemia, unspecified: Secondary | ICD-10-CM | POA: Diagnosis not present

## 2014-03-20 DIAGNOSIS — Z21 Asymptomatic human immunodeficiency virus [HIV] infection status: Secondary | ICD-10-CM | POA: Insufficient documentation

## 2014-03-20 DIAGNOSIS — Z862 Personal history of diseases of the blood and blood-forming organs and certain disorders involving the immune mechanism: Secondary | ICD-10-CM | POA: Insufficient documentation

## 2014-03-20 DIAGNOSIS — I10 Essential (primary) hypertension: Secondary | ICD-10-CM | POA: Insufficient documentation

## 2014-03-20 DIAGNOSIS — I2510 Atherosclerotic heart disease of native coronary artery without angina pectoris: Secondary | ICD-10-CM | POA: Insufficient documentation

## 2014-03-20 DIAGNOSIS — R339 Retention of urine, unspecified: Secondary | ICD-10-CM | POA: Diagnosis not present

## 2014-03-20 DIAGNOSIS — E039 Hypothyroidism, unspecified: Secondary | ICD-10-CM | POA: Insufficient documentation

## 2014-03-20 DIAGNOSIS — R109 Unspecified abdominal pain: Secondary | ICD-10-CM | POA: Insufficient documentation

## 2014-03-20 DIAGNOSIS — Z8619 Personal history of other infectious and parasitic diseases: Secondary | ICD-10-CM | POA: Diagnosis not present

## 2014-03-20 DIAGNOSIS — H269 Unspecified cataract: Secondary | ICD-10-CM | POA: Diagnosis not present

## 2014-03-20 LAB — URINALYSIS, ROUTINE W REFLEX MICROSCOPIC
BILIRUBIN URINE: NEGATIVE
GLUCOSE, UA: NEGATIVE mg/dL
HGB URINE DIPSTICK: NEGATIVE
Ketones, ur: NEGATIVE mg/dL
Leukocytes, UA: NEGATIVE
Nitrite: NEGATIVE
PH: 5.5 (ref 5.0–8.0)
Protein, ur: NEGATIVE mg/dL
SPECIFIC GRAVITY, URINE: 1.011 (ref 1.005–1.030)
Urobilinogen, UA: 0.2 mg/dL (ref 0.0–1.0)

## 2014-03-20 NOTE — ED Notes (Signed)
Pt presents from home, EMS and pt report concur on lower abd pain 10/10 secondary to urinary retention. Onset was last night when pt was unable to get urine out put with self cath which he usually does, this A.M pt had blood with the attempt to self cath, home health nurse unable to cath pt, pt's MD advised pt to come to the ED. Pt in mild pain induced distress.

## 2014-03-20 NOTE — ED Notes (Signed)
Pt. Had another output of 1010cc. Nurses was notified.

## 2014-03-20 NOTE — Progress Notes (Signed)
  CARE MANAGEMENT ED NOTE 03/20/2014  Patient:  Troy Holden, Troy Holden   Account Number:  [id number]  Date Initiated:  03/20/2014  Documentation initiated by:  Jackelyn Poling  Subjective/Objective Assessment:   63 yr old [place] access pt from home, EMS and pt report concur on lower abd pain 10/10 secondary to urinary retention. Onset was last night when pt was unable to get urine out put with self cath which he usually does, this A.M     Subjective/Objective Assessment Detail:   Pt is actively followed by [facility] staff was at home with him today 03/20/14     Action/Plan:   Spoke with pt to assess Home health services   Action/Plan Detail:   Anticipated DC Date:       Status Recommendation to Physician:   Result of Recommendation:    Other ED Services  Consult Working [place]  Other   [place]   Choice offered to / List presented to:  C-1 Patient     [place] arranged  HH-1 RN      [place]    Status of service:  Completed, signed off  ED Comments:   ED Comments Detail:

## 2014-03-20 NOTE — ED Provider Notes (Signed)
CSN: [id number]     Arrival date & time 03/20/14  1605 History   First MD Initiated Contact with Patient 03/20/14 1629     Chief Complaint  Patient presents with  . Urinary Retention     (Consider location/radiation/quality/duration/timing/severity/associated sxs/prior Treatment) Patient is a 63 y.o. male presenting with abdominal pain. The history is provided by the patient.  Abdominal Pain Pain location:  Suprapubic Pain quality: pressure   Pain radiates to:  Does not radiate Pain severity:  Moderate Onset quality:  Gradual Timing:  Constant Progression:  Unchanged Chronicity:  New Context comment:  Unable to self cath this morning Relieved by:  Nothing Worsened by:  Nothing tried Ineffective treatments:  None tried Associated symptoms: no chest pain, no cough, no diarrhea, no dysuria, no fever, no hematuria, no nausea, no shortness of breath and no vomiting     Past Medical History  Diagnosis Date  . Heart disease   . HIV (human immunodeficiency virus infection)   . Anemia   . Anxiety   . Blood transfusion   . Cataract   . Depression   . GERD (gastroesophageal reflux disease)   . Thyroid disease     low thyroid  . 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 HIV (human immunodeficiency virus infection) 04/20/2011  . Dementia 04/20/2011  . CAD (coronary artery disease) 04/20/2011  . Hypothyroidism 04/20/2011  . Dyslipidemia 04/20/2011  . Hypertension   . Dysrhythmia   . Shortness of breath   . Pneumonia   . Hepatitis   . Cancer     lymphoma  . 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 Cardiac surgery    . Abdominal surgery    . Colonoscopy    . Tumor removal      intestine   Family History  Problem Relation Age of Onset  . Colon cancer Mother   . Colon cancer Brother    History  Substance Use Topics  . Smoking status: Current Some Day Smoker -- 0.25 packs/day for 10 years    Types:  Cigarettes    Last Attempt to Quit: 03/30/2011  . Smokeless tobacco: Not on file  . Alcohol Use: No    Review of Systems  Constitutional: Negative for fever.  HENT: Negative for drooling and rhinorrhea.   Eyes: Negative for pain.  Respiratory: Negative for cough and shortness of breath.   Cardiovascular: Negative for chest pain and leg swelling.  Gastrointestinal: Positive for abdominal pain (suprapubic pressure). Negative for nausea, vomiting and diarrhea.  Genitourinary: Negative for dysuria and hematuria.  Musculoskeletal: Negative for gait problem and neck pain.  Skin: Negative for color change.  Neurological: Negative for numbness and headaches.  Hematological: Negative for adenopathy.  Psychiatric/Behavioral: Negative for behavioral problems.  All other systems reviewed and are negative.     Allergies  Lidocaine hcl and Trazodone and nefazodone  Home Medications   Prior to Admission medications   Medication Sig Start Date End Date Taking? Authorizing Provider  clopidogrel (PLAVIX) 75 MG tablet Take 75 mg by mouth daily.      Historical Provider, MD  dolutegravir (TIVICAY) 50 MG tablet Take 50 mg by mouth daily.    Historical Provider, MD  emtricitabine-tenofovir (TRUVADA) 200-300 MG per tablet Take 1 tablet by mouth daily.    Historical Provider, MD  gabapentin (NEURONTIN) 300 MG capsule Take 300 mg by mouth 3 (three) times daily.    Historical Provider, MD  levothyroxine (SYNTHROID, LEVOTHROID) 112 MCG tablet Take 112 mcg by mouth daily before breakfast.    Historical Provider, MD  lisinopril (PRINIVIL,ZESTRIL) 5 MG tablet Take 5 mg by mouth daily.    Historical Provider, MD  metoprolol succinate (TOPROL-XL) 25 MG 24 hr tablet Take 12.5 mg by mouth daily.      Historical Provider, MD  naproxen (NAPROSYN) 250 MG tablet Take 250 mg by mouth 2 (two) times daily as needed (for pain).    Historical Provider, MD  nitroGLYCERIN (NITROSTAT) 0.4 MG SL tablet Place 0.4 mg under  the tongue every 5 (five) minutes as needed for chest pain.     Historical Provider, MD  oxyCODONE-acetaminophen (PERCOCET/ROXICET) 5-325 MG per tablet Take 1 tablet by mouth every 4 (four) hours as needed for severe pain. 09/13/13   Domenic Moras, PA-C  pantoprazole (PROTONIX) 40 MG tablet Take 40 mg by mouth daily.    Historical Provider, MD  pravastatin (PRAVACHOL) 40 MG tablet Take 40 mg by mouth daily.    Historical Provider, MD  sulfamethoxazole-trimethoprim (BACTRIM DS,SEPTRA DS) 800-160 MG per tablet Take 1 tablet by mouth 3 (three) times a week.    Historical Provider, MD  zolpidem (AMBIEN) 10 MG tablet Take 10 mg by mouth at bedtime as needed for sleep.    Historical Provider, MD   BP 103/76  Pulse 74  Temp(Src) 97.7 F (36.5 C) (Oral)  Resp 17  SpO2 96% Physical Exam  Nursing note and vitals reviewed. Constitutional: He is oriented to person, place, and time. He appears well-developed and well-nourished.  HENT:  Head: Normocephalic and atraumatic.  Right Ear: External ear normal.  Left Ear: External ear normal.  Nose: Nose normal.  Mouth/Throat: Oropharynx is clear and moist. No oropharyngeal exudate.  Eyes: Conjunctivae and EOM are normal. Pupils are equal, round, and reactive to light.  Neck: Normal range of motion. Neck supple.  Cardiovascular: Normal rate, regular rhythm, normal heart sounds and intact distal pulses.  Exam reveals no gallop and no friction rub.   No murmur heard. Pulmonary/Chest: Effort normal and breath sounds normal. No respiratory distress. He has no wheezes.  Abdominal: Soft. Bowel sounds are normal. He exhibits no distension. There is tenderness (mild suprapubic ttp). There is no rebound and no guarding.  Musculoskeletal: Normal range of motion. He exhibits no edema and no tenderness.  Neurological: He is alert and oriented to person, place, and time.  Skin: Skin is warm and dry.  Psychiatric: He has a normal mood and affect. His behavior is normal.     ED Course  Procedures (including critical care time) Labs Review Labs Reviewed  URINALYSIS, ROUTINE W REFLEX MICROSCOPIC    Imaging Review No results found.   EKG Interpretation None      MDM   Final diagnoses:  Urinary retention    5:14 PM 63 y.o. male w hx of HIV, CAD on plavix, s/p multip abd surgeries who pw suprapubic pain after being unable to self cath himself this morning. He states he began developing suprapubic pressure since that time. His home health nurse was also unable to cath him. He states that he is otherwise been doing well and denies any fevers, vomiting, or diarrhea. He is afebrile and vital signs are unremarkable here. A Foley was placed by nursing and is draining urine. Will plan on leaving the Foley in place as the patient follows with urology at [facility]. Will check a urinalysis. The patient is having relief of his symptoms with evacuation  of the urine. I do not think any blood work or imaging is necessary.  Pt now reporting he has had a UTI recently.   6:55 PM: I interpreted/reviewed the labs and/or imaging which were non-contributory.  UA neg. Pt had complete sx relief. 800cc out w/ foley.  I have discussed the diagnosis/risks/treatment options with the patient and believe the pt to be eligible for discharge home to follow-up with his urologist at [facility]. We also discussed returning to the ED immediately if new or worsening sx occur. We discussed the sx which are most concerning (e.g., fever, return of abd pain) that necessitate immediate return. Medications administered to the patient during their visit and any new prescriptions provided to the patient are listed below.  Medications given during this visit Medications - No data to display  New Prescriptions   No medications on file     Pamella Pert, MD 03/20/14 4917

## 2014-03-20 NOTE — ED Notes (Signed)
Bed: BE01 Expected date:  Expected time:  Means of arrival:  Comments: EMS urinary retention

## 2014-03-20 NOTE — Discharge Instructions (Signed)
Acute Urinary Retention °Acute urinary retention is the temporary inability to urinate. °This is a common problem in older men. As men age their prostates become larger and block the flow of urine from the bladder. This is usually a problem that has come on gradually.  °HOME CARE INSTRUCTIONS °If you are sent home with a Foley catheter and a drainage system, you will need to discuss the best course of action with your health care provider. While the catheter is in, maintain a good intake of fluids. Keep the drainage bag emptied and lower than your catheter. This is so that contaminated urine will not flow back into your bladder, which could lead to a urinary tract infection. °There are two main types of drainage bags. One is a large bag that usually is used at night. It has a good capacity that will allow you to sleep through the night without having to empty it. The second type is called a leg bag. It has a smaller capacity, so it needs to be emptied more frequently. However, the main advantage is that it can be attached by a leg strap and can go underneath your clothing, allowing you the freedom to move about or leave your home. °Only take over-the-counter or prescription medicines for pain, discomfort, or fever as directed by your health care provider.  °SEEK MEDICAL CARE IF: °· You develop a low-grade fever. °· You experience spasms or leakage of urine with the spasms. °SEEK IMMEDIATE MEDICAL CARE IF:  °· You develop chills or fever. °· Your catheter stops draining urine. °· Your catheter falls out. °· You start to develop increased bleeding that does not respond to rest and increased fluid intake. °MAKE SURE YOU: °· Understand these instructions. °· Will watch your condition. °· Will get help right away if you are not doing well or get worse. °Document Released: 10/18/2000 Document Revised: 07/17/2013 Document Reviewed: 12/21/2012 °ExitCare® Patient Information ©2015 [employer]. This information is not  intended to replace advice given to you by your health care provider. Make sure you discuss any questions you have with your health care provider. ° °

## 2014-03-20 NOTE — Progress Notes (Signed)
Nurse in room with pt when ED Cm went to inquire about name of home health agency

## 2014-10-16 ENCOUNTER — Encounter (HOSPITAL_COMMUNITY): Payer: Self-pay | Admitting: Family Medicine

## 2014-10-16 ENCOUNTER — Emergency Department (HOSPITAL_COMMUNITY)
Admission: EM | Admit: 2014-10-16 | Discharge: 2014-10-16 | Disposition: A | Discharge status: Home / Self Care | Payer: Medicaid Other | Attending: Emergency Medicine | Admitting: Emergency Medicine

## 2014-10-16 DIAGNOSIS — Z720 Tobacco use: Secondary | ICD-10-CM | POA: Insufficient documentation

## 2014-10-16 DIAGNOSIS — Z862 Personal history of diseases of the blood and blood-forming organs and certain disorders involving the immune mechanism: Secondary | ICD-10-CM | POA: Insufficient documentation

## 2014-10-16 DIAGNOSIS — I2510 Atherosclerotic heart disease of native coronary artery without angina pectoris: Secondary | ICD-10-CM | POA: Insufficient documentation

## 2014-10-16 DIAGNOSIS — Z8572 Personal history of non-Hodgkin lymphomas: Secondary | ICD-10-CM | POA: Insufficient documentation

## 2014-10-16 DIAGNOSIS — E785 Hyperlipidemia, unspecified: Secondary | ICD-10-CM | POA: Insufficient documentation

## 2014-10-16 DIAGNOSIS — F419 Anxiety disorder, unspecified: Secondary | ICD-10-CM | POA: Insufficient documentation

## 2014-10-16 DIAGNOSIS — N390 Urinary tract infection, site not specified: Secondary | ICD-10-CM | POA: Insufficient documentation

## 2014-10-16 DIAGNOSIS — Z21 Asymptomatic human immunodeficiency virus [HIV] infection status: Secondary | ICD-10-CM | POA: Diagnosis not present

## 2014-10-16 DIAGNOSIS — Z7982 Long term (current) use of aspirin: Secondary | ICD-10-CM | POA: Diagnosis not present

## 2014-10-16 DIAGNOSIS — R339 Retention of urine, unspecified: Secondary | ICD-10-CM | POA: Diagnosis present

## 2014-10-16 DIAGNOSIS — Z8701 Personal history of pneumonia (recurrent): Secondary | ICD-10-CM | POA: Diagnosis not present

## 2014-10-16 DIAGNOSIS — I10 Essential (primary) hypertension: Secondary | ICD-10-CM | POA: Diagnosis not present

## 2014-10-16 DIAGNOSIS — E079 Disorder of thyroid, unspecified: Secondary | ICD-10-CM | POA: Insufficient documentation

## 2014-10-16 DIAGNOSIS — K219 Gastro-esophageal reflux disease without esophagitis: Secondary | ICD-10-CM | POA: Diagnosis not present

## 2014-10-16 DIAGNOSIS — Z79899 Other long term (current) drug therapy: Secondary | ICD-10-CM | POA: Diagnosis not present

## 2014-10-16 DIAGNOSIS — F0390 Unspecified dementia without behavioral disturbance: Secondary | ICD-10-CM | POA: Insufficient documentation

## 2014-10-16 DIAGNOSIS — F329 Major depressive disorder, single episode, unspecified: Secondary | ICD-10-CM | POA: Insufficient documentation

## 2014-10-16 DIAGNOSIS — Z8619 Personal history of other infectious and parasitic diseases: Secondary | ICD-10-CM | POA: Diagnosis not present

## 2014-10-16 LAB — I-STAT CHEM 8, ED
BUN: 24 mg/dL — ABNORMAL HIGH (ref 6–23)
Calcium, Ion: 1.21 mmol/L (ref 1.13–1.30)
Chloride: 102 mmol/L (ref 96–112)
Creatinine, Ser: 1.8 mg/dL — ABNORMAL HIGH (ref 0.50–1.35)
Glucose, Bld: 134 mg/dL — ABNORMAL HIGH (ref 70–99)
HEMATOCRIT: 38 % — AB (ref 39.0–52.0)
HEMOGLOBIN: 12.9 g/dL — AB (ref 13.0–17.0)
POTASSIUM: 4.2 mmol/L (ref 3.5–5.1)
SODIUM: 139 mmol/L (ref 135–145)
TCO2: 22 mmol/L (ref 0–100)

## 2014-10-16 LAB — CBC WITH DIFFERENTIAL/PLATELET
BASOS ABS: 0 10*3/uL (ref 0.0–0.1)
Basophils Relative: 0 % (ref 0–1)
EOS PCT: 1 % (ref 0–5)
Eosinophils Absolute: 0.1 10*3/uL (ref 0.0–0.7)
HCT: 34.9 % — ABNORMAL LOW (ref 39.0–52.0)
Hemoglobin: 10.8 g/dL — ABNORMAL LOW (ref 13.0–17.0)
Lymphocytes Relative: 11 % — ABNORMAL LOW (ref 12–46)
Lymphs Abs: 0.9 10*3/uL (ref 0.7–4.0)
MCH: 27 pg (ref 26.0–34.0)
MCHC: 30.9 g/dL (ref 30.0–36.0)
MCV: 87.3 fL (ref 78.0–100.0)
MONO ABS: 0.3 10*3/uL (ref 0.1–1.0)
Monocytes Relative: 4 % (ref 3–12)
Neutro Abs: 6.7 10*3/uL (ref 1.7–7.7)
Neutrophils Relative %: 84 % — ABNORMAL HIGH (ref 43–77)
PLATELETS: 217 10*3/uL (ref 150–400)
RBC: 4 MIL/uL — AB (ref 4.22–5.81)
RDW: 16.9 % — ABNORMAL HIGH (ref 11.5–15.5)
WBC: 8 10*3/uL (ref 4.0–10.5)

## 2014-10-16 LAB — URINALYSIS, ROUTINE W REFLEX MICROSCOPIC
BILIRUBIN URINE: NEGATIVE
Glucose, UA: NEGATIVE mg/dL
Ketones, ur: NEGATIVE mg/dL
Nitrite: POSITIVE — AB
Specific Gravity, Urine: 1.011 (ref 1.005–1.030)
UROBILINOGEN UA: 0.2 mg/dL (ref 0.0–1.0)
pH: 6.5 (ref 5.0–8.0)

## 2014-10-16 LAB — URINE MICROSCOPIC-ADD ON

## 2014-10-16 MED ORDER — ACETAMINOPHEN 325 MG PO TABS
650.0000 mg | ORAL_TABLET | Freq: Once | ORAL | Status: AC
  Administered 2014-10-16: 650 mg via ORAL
  Filled 2014-10-16: qty 2

## 2014-10-16 MED ORDER — LEVOFLOXACIN 750 MG PO TABS
750.0000 mg | ORAL_TABLET | Freq: Once | ORAL | Status: AC
  Administered 2014-10-16: 750 mg via ORAL
  Filled 2014-10-16: qty 1

## 2014-10-16 MED ORDER — LEVOFLOXACIN 750 MG PO TABS: 750.0000 mg | ORAL_TABLET | Freq: Every day | ORAL | Status: DC

## 2014-10-16 NOTE — ED Provider Notes (Signed)
CSN: [id number]     Arrival date & time 10/16/14  8676 History   First MD Initiated Contact with Patient 10/16/14 [phone]     Chief Complaint  Patient presents with  . Flank Pain  . Nausea  . 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  Denies fever, nausea, vomiting, constipation.  He is followed at [facility] by urology and ID he has regular monthly appointments next one being April 5.  He also states that he has his Foley changed every 3-4 weeks  Patient is a 64 y.o. male presenting with flank pain. The history is provided by the patient.  Flank Pain This is a recurrent problem. The current episode started yesterday. The problem has been gradually worsening. Pertinent negatives include no abdominal pain or fever. Nothing aggravates the symptoms. He has tried nothing for the symptoms. The treatment provided no relief.    Past Medical History  Diagnosis Date  . Heart disease   . HIV (human immunodeficiency virus infection)   . Anemia   . Anxiety   . Blood transfusion   . Cataract   . Depression   . GERD (gastroesophageal reflux disease)   . Thyroid disease     low thyroid  . 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 HIV (human immunodeficiency virus infection) 04/20/2011  . Dementia 04/20/2011  . CAD (coronary artery disease) 04/20/2011  . Hypothyroidism 04/20/2011  . Dyslipidemia 04/20/2011  . Hypertension   . Dysrhythmia   . Shortness of breath   . Pneumonia   . Hepatitis   . Cancer     lymphoma  . 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 Cardiac surgery    . Abdominal surgery    . Colonoscopy    . Tumor removal     intestine  . Muscle stimulator       In lower back   Family History  Problem Relation Age of Onset  . Colon cancer Mother   . Colon cancer Brother    History  Substance Use Topics  . Smoking status: Current Some Day Smoker -- 0.25 packs/day for 10 years    Types: Cigarettes    Last Attempt to Quit: 03/30/2011  . Smokeless tobacco: Not on file  . Alcohol Use: No    Review of Systems  Constitutional: Negative for fever.  Gastrointestinal: Positive for abdominal distention. Negative for abdominal pain.  Genitourinary: Positive for flank pain.  All other systems reviewed and are negative.     Allergies  Lidocaine hcl and Trazodone and nefazodone  Home Medications   Prior to Admission medications   Medication Sig Start Date End Date Taking? Authorizing Provider  aspirin 81 MG chewable tablet Chew 81 mg by mouth daily.   Yes Historical Provider, MD  diazepam (VALIUM) 5 MG tablet Take 5 mg by mouth at bedtime as needed for anxiety.   Yes Historical Provider, MD  diphenoxylate-atropine (LOMOTIL) 2.5-0.025 MG per tablet Take 2 tablets by mouth 4 (four) times daily as needed for diarrhea or loose stools.   Yes Historical Provider, MD  dolutegravir (TIVICAY) 50 MG tablet Take 50 mg by mouth daily.   Yes Historical Provider, MD  emtricitabine-tenofovir (TRUVADA) 200-300 MG per tablet Take 1 tablet by  mouth daily.   Yes Historical Provider, MD  gabapentin (NEURONTIN) 300 MG capsule Take 300 mg by mouth 3 (three) times daily.   Yes Historical Provider, MD  HYDROmorphone (DILAUDID) 2 MG tablet Take 2 mg by mouth every 4 (four) hours as needed for severe pain.   Yes Historical Provider, MD  levothyroxine (SYNTHROID, LEVOTHROID) 112 MCG tablet Take 112 mcg by mouth daily before breakfast.   Yes Historical Provider, MD  lisinopril (PRINIVIL,ZESTRIL) 5 MG tablet Take 5 mg by mouth daily.   Yes Historical Provider, MD  loperamide (IMODIUM) 2 MG capsule Take 2 mg by mouth as needed for diarrhea  or loose stools.   Yes Historical Provider, MD  metoprolol succinate (TOPROL-XL) 25 MG 24 hr tablet Take 12.5 mg by mouth daily.     Yes Historical Provider, MD  nitroGLYCERIN (NITROSTAT) 0.4 MG SL tablet Place 0.4 mg under the tongue every 5 (five) minutes as needed for chest pain.    Yes Historical Provider, MD  pantoprazole (PROTONIX) 40 MG tablet Take 40 mg by mouth daily.   Yes Historical Provider, MD  pravastatin (PRAVACHOL) 40 MG tablet Take 40 mg by mouth daily.   Yes Historical Provider, MD  simethicone (MYLICON) 329 MG chewable tablet Chew 125 mg by mouth every 6 (six) hours as needed for flatulence.   Yes Historical Provider, MD  sulfamethoxazole-trimethoprim (BACTRIM DS,SEPTRA DS) 800-160 MG per tablet Take 1 tablet by mouth daily.    Yes Historical Provider, MD  tamsulosin (FLOMAX) 0.4 MG CAPS capsule Take 0.4 mg by mouth daily.   Yes Historical Provider, MD  valACYclovir (VALTREX) 500 MG tablet Take 500 mg by mouth 2 (two) times daily.   Yes Historical Provider, MD  vitamin E 200 UNIT capsule Take 200 Units by mouth daily.   Yes Historical Provider, MD  levofloxacin (LEVAQUIN) 750 MG tablet Take 1 tablet (750 mg total) by mouth daily. 10/16/14   Junius Creamer, NP  traMADol (ULTRAM) 50 MG tablet Take 50 mg by mouth every 6 (six) hours as needed for severe pain.     Historical Provider, MD   BP 105/60 mmHg  Pulse 116  Temp(Src) 102.1 F (38.9 C) (Oral)  Resp 18  Ht 6\' 2"  (1.88 m)  Wt 147 lb (66.679 kg)  BMI 18.87 kg/m2  SpO2 94% Physical Exam  Constitutional: He appears well-developed and well-nourished.  Eyes: Pupils are equal, round, and reactive to light.  Neck: Normal range of motion.  Cardiovascular: Normal rate and regular rhythm.   Pulmonary/Chest: Effort normal and breath sounds normal.  Abdominal: Soft. Bowel sounds are normal.  Musculoskeletal: Normal range of motion.  Neurological: He is alert.  Skin: Skin is warm and dry.  Nursing note and vitals reviewed.   ED  Course  Procedures (including critical care time) Labs Review Labs Reviewed  CBC WITH DIFFERENTIAL/PLATELET - Abnormal; Notable for the following:    RBC 4.00 (*)    Hemoglobin 10.8 (*)    HCT 34.9 (*)    RDW 16.9 (*)    Neutrophils Relative % 84 (*)    Lymphocytes Relative 11 (*)    All other components within normal limits  URINALYSIS, ROUTINE W REFLEX MICROSCOPIC - Abnormal; Notable for the following:    APPearance TURBID (*)    Hgb urine dipstick MODERATE (*)    Protein, ur >300 (*)    Nitrite POSITIVE (*)    Leukocytes, UA LARGE (*)    All other components within normal limits  URINE  MICROSCOPIC-ADD ON - Abnormal; Notable for the following:    Bacteria, UA MANY (*)    All other components within normal limits  I-STAT CHEM 8, ED - Abnormal; Notable for the following:    BUN 24 (*)    Creatinine, Ser 1.80 (*)    Glucose, Bld 134 (*)    Hemoglobin 12.9 (*)    HCT 38.0 (*)    All other components within normal limits  URINE CULTURE    Imaging Review No results found.   EKG Interpretation None     will culture urine discussed Patient's results with him.  He is agreeable to antibiotics.  He will keep his appointment as scheduled with his urologist on April 5.  I will start Levaquin 750 mg daily  MDM   Final diagnoses:  UTI (lower urinary tract infection)         Junius Creamer, NP 10/16/14 [place], MD 10/16/14 [phone]

## 2014-10-16 NOTE — ED Notes (Signed)
Standard Foley bag has been changed to a leg bag by [facility].

## 2014-10-16 NOTE — Discharge Instructions (Signed)
Catheter-Associated Urinary Tract Infection FAQs °WHAT IS "CATHETER-ASSOCIATED" URINARY TRACT INFECTION? °A urinary tract infection (also called "UTI") is an infection in the urinary system, which includes the bladder (which stores the urine) and the kidneys (which filter the blood to make urine). Germs (for example, bacteria or yeasts) do not normally live in these areas; but if germs are introduced, an infection can occur. If you have a urinary catheter, germs can travel along the catheter and cause an infection in your bladder or your kidney; in that case it is called a catheter-associated urinary tract infection (or "CA-UTI").  °WHAT IS A URINARY CATHETER? °A urinary catheter is a thin tube placed in the bladder to drain urine. Urine drains through the tube into a bag that collects the urine. A urinary  °catheter may be used: °· If you are not able to urinate on your own. °· To measure the amount of urine that you make, for example, during intensive care. °· During and after some types of surgery. °· During some tests of the kidneys and bladder . °People with urinary catheters have a much higher chance of getting a urinary tract infection than people who don't have a catheter. °HOW DO I GET A CATHETER-ASSOCIATED URINARY TRACT INFECTION (CA-UTI)? °If germs enter the urinary tract, they may cause an infection. Many of the germs that cause a catheter-associated urinary tract infection are common germs found in your intestines that do not usually cause an infection there. Germs can enter the urinary tract when the catheter is being put in or while the catheter remains in the bladder.  °WHAT ARE THE SYMPTOMS OF A URINARY TRACT INFECTION?  °Some of the common symptoms of a urinary tract infection are: °· Burning or pain in the lower abdomen (that is, below the stomach). °· Fever. °· Bloody urine may be a sign of infection, but is also caused by other problems . °· Burning during urination or an increase in the  frequency of urination after the catheter is removed. °Sometimes people with catheter-associated urinary tract infections do not have these symptoms of infection. °CAN CATHETER-ASSOCIATED URINARY TRACT INFECTIONS BE TREATED? °Yes, most catheter-associated urinary tract infections can be treated with antibiotics and removal or change of the catheter. Your doctor will determine which antibiotic is best for you.  °WHAT ARE SOME OF THE THINGS THAT HOSPITALS ARE DOING TO PREVENT CATHETER-ASSOCIATED URINARY TRACT INFECTIONS? °To prevent urinary tract infections, doctors and nurses take the following actions.  °Catheter insertion °· Catheters are put in only when necessary and they are removed as soon as possible. °· Only properly trained persons insert catheters using sterile ("clean") technique. °· The skin in the area where the catheter will be inserted is cleaned before inserting the catheter. °· Other methods to drain the urine are sometimes used, such as: °¨ External catheters in men (these look like condoms and are placed over the penis rather than into the penis) °¨ Putting a temporary catheter in to drain the urine and removing it right away. This is called intermittent urethral catheterization. °Catheter care °· Healthcare providers clean their hands by washing them with soap and water or using an alcohol-based hand rub before and after touching your catheter. °¨ If you do not see your providers clean their hands, please ask them to do so. °· Avoid disconnecting the catheter and drain tube. This helps to prevent germs from getting into the catheter tube. °· The catheter is secured to the leg to prevent pulling on the   catheter.  Avoid twisting or kinking the catheter.  Keep the bag lower than the bladder to prevent urine from backflowing to the bladder.  Empty the bag regularly. The drainage spout should not touch anything while emptying the bag. WHAT CAN I DO TO HELP PREVENT CATHETER-ASSOCIATED URINARY  TRACT INFECTIONS IF I HAVE A CATHETER?  Always clean your hands before and after doing catheter care.  Always keep your urine bag below the level of your bladder.  Do not tug or pull on the tubing.  Do not twist or kink the catheter tubing.  Ask your healthcare provider each day if you still need the catheter. WHAT DO I NEED TO DO WHEN I [place]?  If you will be going home with a catheter, your doctor or nurse should explain everything you need to know about taking care of the catheter. Make sure you understand how to care for it before you leave the hospital.  If you develop any of the symptoms of a urinary tract infection, such as burning or pain in the lower abdomen, fever, or an increase in the frequency of urination, contact your doctor or nurse immediately.  Before you go home, make sure you know who to contact if you have questions or problems after you get home. If you have questions, please ask your doctor or nurse. Developed and co-sponsored by [employer] for [place] [id number]); Infectious Diseases Society of [place] (IDSA); [employer] [place]; Association for Professionals in Infection Control and Epidemiology (APIC); Center for Disease Control (CDC); and The Joint Commission Document Released: 04/05/2012 Document Reviewed: 04/05/2012 [facility] Patient Information 2015 [place]. This information is not intended to replace advice given to you by your health care provider. Make sure you discuss any questions you have with your health care provider. You have been started on Levaquin for your urinary tract infections as a once a day, medication since he was started in the morning.  Please take due to tablets the next 9 days in the morning.  Follow-up with your urologist at [facility] as scheduled on April 5.  Today, your Foley catheter was removed and a new one inserted.  Please inform your physician of this as well

## 2014-10-16 NOTE — ED Notes (Signed)
Bed: ZY34 Expected date:  Expected time:  Means of arrival:  Comments: EMS 89M flank pain

## 2014-10-16 NOTE — ED Notes (Signed)
Patient had a muscle stimulator placed to his right lower back to help stimulate urine output on Friday at [facility].

## 2014-10-16 NOTE — ED Notes (Signed)
Using clean technique, deflated 38mL of NS from a 14 Fr Catheter that patient reports was placed at [facility]. Removed catheter. Pt tolerated it well. Before removing catheter, performed pt hygiene for having a soft, brown, bowel movement.

## 2014-10-16 NOTE — ED Notes (Signed)
Per EMS, patient is from home and complaining of bilateral flank pain, nausea, and decrease urinary retention. Pt has foley catheter in place. Denies any blood in urine. Pt has a history of kidney failure in Oct and reports this feels like the same.

## 2014-10-16 NOTE — ED Notes (Signed)
Urine is in the leg and pt reports the urine that is in the bag is new since being in the ED. Pt reports mid to flank pain. Tenderness to mid back.

## 2014-10-19 LAB — URINE CULTURE

## 2014-10-21 ENCOUNTER — Telehealth (HOSPITAL_COMMUNITY): Payer: Self-pay

## 2014-10-21 NOTE — Telephone Encounter (Signed)
Post ED Visit - Positive Culture Follow-up  Culture report reviewed by antimicrobial stewardship pharmacist: []  [place]es [place], Pharm.D., BCPS []  Heide Guile, Pharm.D., BCPS []  Alycia Rossetti, Pharm.D., BCPS []  [place], [place].D., BCPS, AAHIVP [x]  Legrand Como, Pharm.D., BCPS, AAHIVP []  Isac Sarna, Pharm.D., BCPS  Positive Urine culture, >/= 100,000 colonies -> Ecoli & Klebsiella Oxytoca Treated with Levofloxacin, organism sensitive to the same and no further patient follow-up is required at this time.  Dortha Kern 10/21/2014, 3:37 AM

## 2015-03-13 ENCOUNTER — Emergency Department (HOSPITAL_COMMUNITY)
Admission: EM | Admit: 2015-03-13 | Discharge: 2015-03-13 | Disposition: A | Discharge status: Home / Self Care | Payer: Medicaid Other

## 2015-03-13 NOTE — ED Notes (Signed)
Patient here by EMS, patient refuses treatment at [facility] stating "I was at [facility] last night and I was called back this morning and I am to be taken back to [facility] today by EMS".  EMS unit unavailable to transport patient to [facility].  Patient was explained the same, but was abusive to EMS personnel and to  ED staff.   Patient would not listen to reason.   I called charge RN to advise on this situation.   She will follow up with patient that has been taken to the waiting room.   Again, patient refused to be triaged at [facility].

## 2015-04-17 ENCOUNTER — Encounter (HOSPITAL_COMMUNITY): Payer: Self-pay

## 2015-04-17 ENCOUNTER — Emergency Department (HOSPITAL_COMMUNITY)
Admission: EM | Admit: 2015-04-17 | Discharge: 2015-04-17 | Discharge status: Against Medical Advice | Payer: Medicaid Other | Attending: Emergency Medicine | Admitting: Emergency Medicine

## 2015-04-17 DIAGNOSIS — N390 Urinary tract infection, site not specified: Secondary | ICD-10-CM | POA: Diagnosis not present

## 2015-04-17 DIAGNOSIS — F0390 Unspecified dementia without behavioral disturbance: Secondary | ICD-10-CM | POA: Diagnosis not present

## 2015-04-17 DIAGNOSIS — I2510 Atherosclerotic heart disease of native coronary artery without angina pectoris: Secondary | ICD-10-CM | POA: Diagnosis not present

## 2015-04-17 DIAGNOSIS — Z720 Tobacco use: Secondary | ICD-10-CM | POA: Diagnosis not present

## 2015-04-17 LAB — CBC WITH DIFFERENTIAL/PLATELET
BASOS ABS: 0 10*3/uL (ref 0.0–0.1)
Basophils Relative: 0 %
Eosinophils Absolute: 0.1 10*3/uL (ref 0.0–0.7)
Eosinophils Relative: 2 %
HCT: 33.6 % — ABNORMAL LOW (ref 39.0–52.0)
HEMOGLOBIN: 10.3 g/dL — AB (ref 13.0–17.0)
LYMPHS ABS: 1 10*3/uL (ref 0.7–4.0)
LYMPHS PCT: 20 %
MCH: 27.8 pg (ref 26.0–34.0)
MCHC: 30.7 g/dL (ref 30.0–36.0)
MCV: 90.6 fL (ref 78.0–100.0)
Monocytes Absolute: 0.4 10*3/uL (ref 0.1–1.0)
Monocytes Relative: 8 %
NEUTROS ABS: 3.4 10*3/uL (ref 1.7–7.7)
NEUTROS PCT: 70 %
Platelets: 142 10*3/uL — ABNORMAL LOW (ref 150–400)
RBC: 3.71 MIL/uL — AB (ref 4.22–5.81)
RDW: 18.1 % — ABNORMAL HIGH (ref 11.5–15.5)
WBC: 4.9 10*3/uL (ref 4.0–10.5)

## 2015-04-17 LAB — URINE MICROSCOPIC-ADD ON

## 2015-04-17 LAB — BASIC METABOLIC PANEL
ANION GAP: 7 (ref 5–15)
BUN: 21 mg/dL — AB (ref 6–20)
CHLORIDE: 103 mmol/L (ref 101–111)
CO2: 24 mmol/L (ref 22–32)
Calcium: 8.3 mg/dL — ABNORMAL LOW (ref 8.9–10.3)
Creatinine, Ser: 1.86 mg/dL — ABNORMAL HIGH (ref 0.61–1.24)
GFR calc Af Amer: 43 mL/min — ABNORMAL LOW (ref >60)
GFR calc non Af Amer: 37 mL/min — ABNORMAL LOW (ref >60)
GLUCOSE: 102 mg/dL — AB (ref 65–99)
POTASSIUM: 4.1 mmol/L (ref 3.5–5.1)
Sodium: 134 mmol/L — ABNORMAL LOW (ref 135–145)

## 2015-04-17 LAB — URINALYSIS, ROUTINE W REFLEX MICROSCOPIC
BILIRUBIN URINE: NEGATIVE
Glucose, UA: 100 mg/dL — AB
KETONES UR: NEGATIVE mg/dL
NITRITE: NEGATIVE
PROTEIN: 100 mg/dL — AB
Specific Gravity, Urine: 1.017 (ref 1.005–1.030)
UROBILINOGEN UA: 0.2 mg/dL (ref 0.0–1.0)
pH: 6 (ref 5.0–8.0)

## 2015-04-17 NOTE — Care Management (Signed)
ED CM received call from patient demanding a copy of his lab work be faxed to [facility]. CM instructed him on the Mychart process, patient then stated he was not discharged he left before being seen by EDP. CM attempted to explained that patient would need to sign a medical record release form with Medical Records Patient became upset started yelling and screaming "It can be done without medical records!!" CM apologized for him being upset, Patient ended call.Marland Kitchen

## 2015-04-17 NOTE — ED Notes (Signed)
Pt presents with 3 week h/o UTI, pt was on abx x 2 - ended on Sunday.  Pt reports noting hematuria today with continued dysuria.  Pt reports lower abdominal pain.

## 2015-04-20 LAB — URINE CULTURE

## 2015-04-21 ENCOUNTER — Telehealth (HOSPITAL_COMMUNITY): Payer: Self-pay

## 2015-04-21 NOTE — Progress Notes (Signed)
ED Antimicrobial Stewardship Positive Culture Follow Up   Troy Holden is an 64 y.o. male who presented to [facility] on 04/17/2015 with a chief complaint of No chief complaint on file.   Recent Results (from the past 720 hour(s))  Urine culture     Status: None   Collection Time: 04/17/15  3:16 PM  Result Value Ref Range Status   Specimen Description URINE, CLEAN CATCH  Final   Special Requests Immunocompromised  Final   Culture   Final    >=100,000 COLONIES/mL STENOTROPHOMONAS MALTOPHILIA 60,000 COLONIES/ml ENTEROCOCCUS SPECIES    Report Status 04/20/2015 FINAL  Final   Organism ID, Bacteria STENOTROPHOMONAS MALTOPHILIA  Final   Organism ID, Bacteria ENTEROCOCCUS SPECIES  Final      Susceptibility   Stenotrophomonas maltophilia - MIC*    LEVOFLOXACIN >=8 RESISTANT Resistant     TRIMETH/SULFA >=320 RESISTANT Resistant     * >=100,000 COLONIES/mL STENOTROPHOMONAS MALTOPHILIA   Enterococcus species - MIC*    AMPICILLIN <=2 SENSITIVE Sensitive     LEVOFLOXACIN >=8 RESISTANT Resistant     NITROFURANTOIN <=16 SENSITIVE Sensitive     VANCOMYCIN 1 SENSITIVE Sensitive     * 60,000 COLONIES/ml ENTEROCOCCUS SPECIES    UA likely collected from foley catheter - unclear.  Recommend attempting to contact patient to encourage him to seek treatment if still symptomatic.   ED Provider: Clayton Bibles, PA-C   Joya San, PharmD Clinical Pharmacy Resident Pager # [phone] 04/21/2015 9:13 AM  Infectious Diseases Pharmacist Phone# [phone]  u

## 2015-04-21 NOTE — Telephone Encounter (Signed)
Post ED Visit - Positive Culture Follow-up: Successful Patient Follow-Up  Culture assessed and recommendations reviewed by: []  Heide Guile, Pharm.D., BCPS-AQ ID []  Alycia Rossetti, Pharm.D., BCPS []  [place], [place].D., BCPS, AAHIVP []  Legrand Como, Pharm.D., BCPS, AAHIVP [x]  [employer], Pharm.D. []  Milus Glazier, Pharm.D.  Positive  Urine culture  [x]  Patient discharged without antimicrobial prescription and treatment is now indicated []  Organism is resistant to prescribed ED discharge antimicrobial []  Patient with positive blood cultures  Changes discussed with ED provider: Clayton Bibles PA New antibiotic prescription Pt left ama. Pt needs to seek treatment if still symptomatic  No answer  Ileene Musa 04/21/2015, 11:51 AM

## 2015-04-23 ENCOUNTER — Telehealth (HOSPITAL_BASED_OUTPATIENT_CLINIC_OR_DEPARTMENT_OTHER): Payer: Self-pay | Admitting: Emergency Medicine

## 2015-04-24 NOTE — Telephone Encounter (Signed)
Spoke with patient re symptoms, states is symptomatic but stated he would NEVER return to [facility] ever again, then began to verbalize multiple complaints regarding [facility], attempted to transfer him to Office of Patient Experience, he stated that they did "not care", reiiterated to seek medical care for reeval of possible UTI at whatever facility that he desires

## 2015-05-02 ENCOUNTER — Emergency Department (HOSPITAL_COMMUNITY): Payer: Medicaid Other

## 2015-05-02 ENCOUNTER — Inpatient Hospital Stay (HOSPITAL_COMMUNITY)
Admission: EM | Admit: 2015-05-02 | Discharge: 2015-05-04 | DRG: 977 | Disposition: A | Discharge status: Home / Self Care | Payer: Medicaid Other | Attending: Family Medicine | Admitting: Family Medicine

## 2015-05-02 ENCOUNTER — Encounter (HOSPITAL_COMMUNITY): Payer: Self-pay | Admitting: Oncology

## 2015-05-02 DIAGNOSIS — F17210 Nicotine dependence, cigarettes, uncomplicated: Secondary | ICD-10-CM | POA: Diagnosis present

## 2015-05-02 DIAGNOSIS — Z21 Asymptomatic human immunodeficiency virus [HIV] infection status: Secondary | ICD-10-CM | POA: Diagnosis present

## 2015-05-02 DIAGNOSIS — Z888 Allergy status to other drugs, medicaments and biological substances status: Secondary | ICD-10-CM

## 2015-05-02 DIAGNOSIS — R1084 Generalized abdominal pain: Secondary | ICD-10-CM | POA: Diagnosis present

## 2015-05-02 DIAGNOSIS — I4891 Unspecified atrial fibrillation: Secondary | ICD-10-CM | POA: Diagnosis present

## 2015-05-02 DIAGNOSIS — Z87440 Personal history of urinary (tract) infections: Secondary | ICD-10-CM

## 2015-05-02 DIAGNOSIS — C8330 Diffuse large B-cell lymphoma, unspecified site: Secondary | ICD-10-CM | POA: Diagnosis present

## 2015-05-02 DIAGNOSIS — A419 Sepsis, unspecified organism: Secondary | ICD-10-CM | POA: Diagnosis present

## 2015-05-02 DIAGNOSIS — N1330 Unspecified hydronephrosis: Secondary | ICD-10-CM | POA: Diagnosis present

## 2015-05-02 DIAGNOSIS — E039 Hypothyroidism, unspecified: Secondary | ICD-10-CM | POA: Diagnosis present

## 2015-05-02 DIAGNOSIS — Z800 Family history of malignant neoplasm of digestive organs: Secondary | ICD-10-CM

## 2015-05-02 DIAGNOSIS — Z79899 Other long term (current) drug therapy: Secondary | ICD-10-CM

## 2015-05-02 DIAGNOSIS — K219 Gastro-esophageal reflux disease without esophagitis: Secondary | ICD-10-CM | POA: Diagnosis present

## 2015-05-02 DIAGNOSIS — G893 Neoplasm related pain (acute) (chronic): Secondary | ICD-10-CM | POA: Diagnosis present

## 2015-05-02 DIAGNOSIS — C20 Malignant neoplasm of rectum: Secondary | ICD-10-CM | POA: Diagnosis present

## 2015-05-02 DIAGNOSIS — H269 Unspecified cataract: Secondary | ICD-10-CM | POA: Diagnosis present

## 2015-05-02 DIAGNOSIS — M549 Dorsalgia, unspecified: Secondary | ICD-10-CM

## 2015-05-02 DIAGNOSIS — Z7982 Long term (current) use of aspirin: Secondary | ICD-10-CM

## 2015-05-02 DIAGNOSIS — I2510 Atherosclerotic heart disease of native coronary artery without angina pectoris: Secondary | ICD-10-CM | POA: Diagnosis present

## 2015-05-02 DIAGNOSIS — Z9889 Other specified postprocedural states: Secondary | ICD-10-CM

## 2015-05-02 DIAGNOSIS — F0390 Unspecified dementia without behavioral disturbance: Secondary | ICD-10-CM | POA: Diagnosis present

## 2015-05-02 DIAGNOSIS — R34 Anuria and oliguria: Secondary | ICD-10-CM | POA: Diagnosis present

## 2015-05-02 DIAGNOSIS — I10 Essential (primary) hypertension: Secondary | ICD-10-CM | POA: Diagnosis present

## 2015-05-02 DIAGNOSIS — T83511A Infection and inflammatory reaction due to indwelling urethral catheter, initial encounter: Secondary | ICD-10-CM

## 2015-05-02 DIAGNOSIS — N319 Neuromuscular dysfunction of bladder, unspecified: Secondary | ICD-10-CM | POA: Diagnosis present

## 2015-05-02 DIAGNOSIS — T83511D Infection and inflammatory reaction due to indwelling urethral catheter, subsequent encounter: Secondary | ICD-10-CM

## 2015-05-02 DIAGNOSIS — B20 Human immunodeficiency virus [HIV] disease: Secondary | ICD-10-CM | POA: Diagnosis present

## 2015-05-02 DIAGNOSIS — N179 Acute kidney failure, unspecified: Secondary | ICD-10-CM | POA: Diagnosis present

## 2015-05-02 DIAGNOSIS — N390 Urinary tract infection, site not specified: Secondary | ICD-10-CM | POA: Diagnosis present

## 2015-05-02 DIAGNOSIS — D649 Anemia, unspecified: Principal | ICD-10-CM | POA: Diagnosis present

## 2015-05-02 LAB — CBC WITH DIFFERENTIAL/PLATELET
BASOS ABS: 0 10*3/uL (ref 0.0–0.1)
BASOS PCT: 0 %
EOS PCT: 2 %
Eosinophils Absolute: 0.1 10*3/uL (ref 0.0–0.7)
HEMATOCRIT: 20.4 % — AB (ref 39.0–52.0)
Hemoglobin: 6.4 g/dL — CL (ref 13.0–17.0)
LYMPHS PCT: 17 %
Lymphs Abs: 1.1 10*3/uL (ref 0.7–4.0)
MCH: 27.4 pg (ref 26.0–34.0)
MCHC: 31.4 g/dL (ref 30.0–36.0)
MCV: 87.2 fL (ref 78.0–100.0)
MONOS PCT: 11 %
Monocytes Absolute: 0.7 10*3/uL (ref 0.1–1.0)
NEUTROS ABS: 4.3 10*3/uL (ref 1.7–7.7)
Neutrophils Relative %: 70 %
Platelets: 171 10*3/uL (ref 150–400)
RBC: 2.34 MIL/uL — ABNORMAL LOW (ref 4.22–5.81)
RDW: 17.1 % — AB (ref 11.5–15.5)
WBC: 6.2 10*3/uL (ref 4.0–10.5)

## 2015-05-02 LAB — COMPREHENSIVE METABOLIC PANEL
ALBUMIN: 3.2 g/dL — AB (ref 3.5–5.0)
ALT: 9 U/L — ABNORMAL LOW (ref 17–63)
ANION GAP: 8 (ref 5–15)
AST: 11 U/L — AB (ref 15–41)
Alkaline Phosphatase: 63 U/L (ref 38–126)
BUN: 28 mg/dL — ABNORMAL HIGH (ref 6–20)
CHLORIDE: 106 mmol/L (ref 101–111)
CO2: 22 mmol/L (ref 22–32)
Calcium: 8.6 mg/dL — ABNORMAL LOW (ref 8.9–10.3)
Creatinine, Ser: 1.89 mg/dL — ABNORMAL HIGH (ref 0.61–1.24)
GFR calc Af Amer: 42 mL/min — ABNORMAL LOW (ref >60)
GFR calc non Af Amer: 36 mL/min — ABNORMAL LOW (ref >60)
GLUCOSE: 121 mg/dL — AB (ref 65–99)
POTASSIUM: 3.8 mmol/L (ref 3.5–5.1)
SODIUM: 136 mmol/L (ref 135–145)
Total Bilirubin: 0.4 mg/dL (ref 0.3–1.2)
Total Protein: 7.2 g/dL (ref 6.5–8.1)

## 2015-05-02 LAB — PREPARE RBC (CROSSMATCH)

## 2015-05-02 MED ORDER — MORPHINE SULFATE (PF) 4 MG/ML IV SOLN
4.0000 mg | Freq: Once | INTRAVENOUS | Status: AC
  Administered 2015-05-02: 4 mg via INTRAVENOUS
  Filled 2015-05-02: qty 1

## 2015-05-02 MED ORDER — ONDANSETRON HCL 4 MG/2ML IJ SOLN
4.0000 mg | Freq: Once | INTRAMUSCULAR | Status: AC
  Administered 2015-05-02: 4 mg via INTRAVENOUS
  Filled 2015-05-02: qty 2

## 2015-05-02 MED ORDER — SODIUM CHLORIDE 0.9 % IV BOLUS (SEPSIS)
1000.0000 mL | INTRAVENOUS | Status: AC
  Administered 2015-05-02 (×2): 1000 mL via INTRAVENOUS

## 2015-05-02 MED ORDER — VANCOMYCIN HCL IN DEXTROSE 1-5 GM/200ML-% IV SOLN
1000.0000 mg | Freq: Once | INTRAVENOUS | Status: AC
  Administered 2015-05-02: 1000 mg via INTRAVENOUS
  Filled 2015-05-02: qty 200

## 2015-05-02 NOTE — ED Notes (Signed)
Per PT[facility] pt presents d/t abdominal pain, nausea and decreased urine output.  Pt w/ hx of renal failure.  A&O x 4.

## 2015-05-02 NOTE — ED Provider Notes (Signed)
S: Troy Holden is a 64 y.o. male with a history of HIV followed by you in see infectious disease (CD4 - 255 and undetectable viral load in August 2016), CAD, hypertension, lymphoma, reported rectal cancer with abdominal metastasis presents to the ED with 2-3 days of generalized abdominal pain, nausea, CVA tenderness, decreased urine output, and hematuria. Pt was previously treated for UTI on 04/17/15.  Record review shows that his urine culture was positive for Stenotrophomonas Maltophilia and Enterococcus species. Pt states he is compliant with his antiviral medications.  Urinary catheter has been in place x1 month.     O:  General: Awake, cachectic, pale  HEENT: Atraumatic; moist but pale mucous membranes  Neck: supple, FROM, no lymphadenopathy Resp: Normal effort  Cardiac: RRR Abd: Nondistended, tender throughout with guarding throughout the abd; bilateral CVA tenderness Neuro:No focal weakness  MSK: No edema, FROM of all major joints Skin: pale, no rash Psyc: normal mood and affect    Results for orders placed or performed during the hospital encounter of 05/02/15  Comprehensive metabolic panel  Result Value Ref Range   Sodium 136 135 - 145 mmol/L   Potassium 3.8 3.5 - 5.1 mmol/L   Chloride 106 101 - 111 mmol/L   CO2 22 22 - 32 mmol/L   Glucose, Bld 121 (H) 65 - 99 mg/dL   BUN 28 (H) 6 - 20 mg/dL   Creatinine, Ser 1.89 (H) 0.61 - 1.24 mg/dL   Calcium 8.6 (L) 8.9 - 10.3 mg/dL   Total Protein 7.2 6.5 - 8.1 g/dL   Albumin 3.2 (L) 3.5 - 5.0 g/dL   AST 11 (L) 15 - 41 U/L   ALT 9 (L) 17 - 63 U/L   Alkaline Phosphatase 63 38 - 126 U/L   Total Bilirubin 0.4 0.3 - 1.2 mg/dL   GFR calc non Af Amer 36 (L) >60 mL/min   GFR calc Af Amer 42 (L) >60 mL/min   Anion gap 8 5 - 15  CBC WITH DIFFERENTIAL  Result Value Ref Range   WBC 6.2 4.0 - 10.5 K/uL   RBC 2.34 (L) 4.22 - 5.81 MIL/uL   Hemoglobin 6.4 (LL) 13.0 - 17.0 g/dL   HCT 20.4 (L) 39.0 - 52.0 %   MCV 87.2 78.0 - 100.0 fL   MCH  27.4 26.0 - 34.0 pg   MCHC 31.4 30.0 - 36.0 g/dL   RDW 17.1 (H) 11.5 - 15.5 %   Platelets 171 150 - 400 K/uL   Neutrophils Relative % 70 %   Lymphocytes Relative 17 %   Monocytes Relative 11 %   Eosinophils Relative 2 %   Basophils Relative 0 %   Neutro Abs 4.3 1.7 - 7.7 K/uL   Lymphs Abs 1.1 0.7 - 4.0 K/uL   Monocytes Absolute 0.7 0.1 - 1.0 K/uL   Eosinophils Absolute 0.1 0.0 - 0.7 K/uL   Basophils Absolute 0.0 0.0 - 0.1 K/uL  I-Stat CG4 Lactic Acid, ED  (not at  [facility])  Result Value Ref Range   Lactic Acid, Venous <0.30 (L) 0.5 - 2.0 mmol/L  POC occult blood, ED Provider will collect  Result Value Ref Range   Fecal Occult Bld NEGATIVE NEGATIVE  Prepare RBC  Result Value Ref Range   Order Confirmation ORDER PROCESSED BY BLOOD BANK   Type and screen  Result Value Ref Range   ABO/RH(D) B NEG    Antibody Screen NEG    Sample Expiration 05/05/2015    Unit Number [id number]  Blood Component Type RBC LR PHER2    Unit division 00    Status of Unit ALLOCATED    Transfusion Status OK TO TRANSFUSE    Crossmatch Result Compatible    Unit Number [id number]    Blood Component Type RBC LR PHER2    Unit division 00    Status of Unit ISSUED    Transfusion Status OK TO TRANSFUSE    Crossmatch Result Compatible   ABO/Rh  Result Value Ref Range   ABO/RH(D) B NEG    Ct Abdomen Pelvis Wo Contrast  05/03/2015   CLINICAL DATA:  Abdominal pain, nausea and decreased urine output.  EXAM: CT ABDOMEN AND PELVIS WITHOUT CONTRAST  TECHNIQUE: Multidetector CT imaging of the abdomen and pelvis was performed following the standard protocol without IV contrast.  COMPARISON:  12/20/2013  FINDINGS: Lower chest:  Hypoventilatory changes, worse in the left lung base.  Hepatobiliary: No masses or other significant abnormality identified. Rim calcified 1.9 cm gallbladder stone is again seen.  Pancreas:  Normal unenhanced appearance.  Spleen: The spleen is enlarged, with peripheral calcification, likely  posttraumatic.  Adrenal Glands:  No masses identified.  Kidneys/Urinary Tract: There is bilateral hydronephrosis, worse on the left, and moderate hydroureter. The dilated left ureter could be followed to the level of the vesicoureteral junction. The urinary bladder wall is diffusely thickened, however the urinary bladder is poorly evaluated due to lack of IV contrast and decompressed state, around urinary Foley. The prostate gland is not enlarged but contains coarse calcifications.  Stomach/Bowel/Peritoneum: No evidence of wall thickening, mass, or obstruction. There is a large amount of stool throughout the colon. Enterotomy lines also seen in the right lower abdomen.  Vascular/Lymphatic: No pathologically enlarged lymph nodes identified. There are heavy atherosclerotic calcifications of the aorta.  Other: Electronic apparatus is seen within the soft tissues of the right buttock, with lead terminating in right perirectal location.  Musculoskeletal:  No suspicious bone lesions identified.  IMPRESSION: Severe bilateral hydronephrosis and hydroureter, worse on the left. No evident reason is seen on this nonenhanced CT. The urinary bladder wall is diffusely thickened, however the urinary bladder is decompressed around a Foley catheter and therefore poorly evaluated. The prostate gland is not particularly enlarged but contains multiple calcifications.  Large amount of stool throughout the colon, consistent with constipation.  1.9 cm rim calcified gallstone.  Heavy atherosclerotic disease of the aorta.  Splenomegaly.   Electronically Signed   By: Fidela Salisbury M.D.   On: 05/03/2015 01:24   A/P:  Pt with abd pain, concern for failed outpatient treatment of his UTI.  Pt with severe anemia at 6.4, down from 10.3 with unknown source of bleeding. Blood transfusion ordered. Fecal occult Negative and patient denies nausea or hematochezia. Abdomen remains tender throughout with CVA tenderness.    2:19 AM No  urinalysis at this time or urine culture as ED nursing staff are unable to replace his foley until he is moved to the floor.    Discussed with Dr. Arnoldo Morale who will admit to tele.  Will consult with urology for consult of his bilateral hydronephrosis and hydroureter.    2:35 AM Consult with Urology who will consult.    BP 115/67 mmHg  Pulse 86  Temp(Src) 98.8 F (37.1 C) (Oral)  Resp 14  Ht 6\' 2"  (1.88 m)  Wt 134 lb (60.782 kg)  BMI 17.20 kg/m2  SpO2 97%   Pt was seen by Arlean Hopping, PA-C and personally evaluated by myself with Junie Panning  Billy Fischer, MD supervising.      Jarrett Soho Harlow Carrizales, PA-C 05/03/15 [id number]  Gareth Morgan, MD 05/05/15 [phone]

## 2015-05-02 NOTE — ED Notes (Signed)
Bed: FE76 Expected date:  Expected time:  Means of arrival:  Comments: 3M abd pain n/v ?kidney problems

## 2015-05-02 NOTE — ED Provider Notes (Signed)
CSN: [id number]     Arrival date & time 05/02/15  2130 History   First MD Initiated Contact with Patient 05/02/15 2131     Chief Complaint  Patient presents with  . Abdominal Pain     (Consider location/radiation/quality/duration/timing/severity/associated sxs/prior Treatment) HPI  Troy Holden is a 64 y.o. male presents with 2-3 days history of generalized abdominal pain, nausea, CVA tenderness, decreased urine output, and hematuria. Pt was previously treated for UTI on 04/17/15, urine culture positive for Stenotrophomonas Maltophilia and Enterococcus species. Pt states he has stage IV rectal cancer, which has spread throughout his abdomen. Pt states he is completely incontinent since one of his surgeries last year. Pt has an indwelling urinary catheter, which he says has been in for about a month. Of note, pt is HIV positive.  States his viral load was "near zero" when it was checked last month. States he is compliant with his antiviral medications.  Past Medical History  Diagnosis Date  . Heart disease   . HIV (human immunodeficiency virus infection) ([place])   . Anemia   . Anxiety   . Blood transfusion   . Cataract   . Depression   . GERD (gastroesophageal reflux disease)   . Thyroid disease     low thyroid  . 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 HIV (human immunodeficiency virus infection) ([place]) 04/20/2011  . Dementia 04/20/2011  . CAD (coronary artery disease) 04/20/2011  . Hypothyroidism 04/20/2011  . Dyslipidemia 04/20/2011  . Hypertension   . Dysrhythmia   . Shortness of breath   . Pneumonia   . Hepatitis   . Cancer ([place])     lymphoma  . DLBCL (diffuse large B cell lymphoma) ([place]) 04/20/2011   Past Surgical History  Procedure Laterality Date  . Cardiac surgery    . Abdominal surgery    . Colonoscopy    . Tumor removal      intestine  . Muscle stimulator       In lower back   Family History  Problem Relation Age of Onset  . Colon cancer Mother   .  Colon cancer Brother    Social History  Substance Use Topics  . Smoking status: Current Some Day Smoker -- 0.25 packs/day for 10 years    Types: Cigarettes    Last Attempt to Quit: 03/30/2011  . Smokeless tobacco: None  . Alcohol Use: No    Review of Systems  Constitutional: Positive for fatigue. Negative for fever, chills, diaphoresis, activity change and appetite change.  Respiratory: Negative for cough, chest tightness and shortness of breath.   Cardiovascular: Negative for chest pain, palpitations and leg swelling.  Gastrointestinal: Positive for nausea and abdominal pain. Negative for vomiting, diarrhea, constipation, blood in stool and abdominal distention.  Genitourinary: Positive for dysuria, flank pain and decreased urine volume. Negative for urgency, discharge, penile pain and testicular pain.  Musculoskeletal: Negative for myalgias, joint swelling and arthralgias.  Skin: Negative for color change and pallor.  Neurological: Negative for dizziness, syncope, weakness, light-headedness, numbness and headaches.      Allergies  Lidocaine hcl; Trazodone and nefazodone; and Oxycodone  Home Medications   Prior to Admission medications   Medication Sig Start Date End Date Taking? Authorizing Provider  apixaban (ELIQUIS) 5 MG TABS tablet Take 5 mg by mouth 2 (two) times daily.   Yes Historical Provider, MD  aspirin 81 MG chewable tablet Chew 81 mg by mouth daily.   Yes Historical Provider, MD  DESCOVY 200-25 MG  tablet Take 1 tablet by mouth daily. 04/18/15  Yes Historical Provider, MD  diazepam (VALIUM) 5 MG tablet Take 5 mg by mouth at bedtime as needed for anxiety.   Yes Historical Provider, MD  diphenoxylate-atropine (LOMOTIL) 2.5-0.025 MG per tablet Take 2 tablets by mouth 4 (four) times daily.    Yes Historical Provider, MD  dolutegravir (TIVICAY) 50 MG tablet Take 50 mg by mouth daily.   Yes Historical Provider, MD  gabapentin (NEURONTIN) 300 MG capsule Take 300 mg by mouth  3 (three) times daily.   Yes Historical Provider, MD  HYDROmorphone (DILAUDID) 2 MG tablet Take 2 mg by mouth every 4 (four) hours as needed for severe pain.   Yes Historical Provider, MD  levothyroxine (SYNTHROID, LEVOTHROID) 112 MCG tablet Take 112 mcg by mouth daily before breakfast.   Yes Historical Provider, MD  lisinopril (PRINIVIL,ZESTRIL) 20 MG tablet Take 1 tablet by mouth daily. 03/14/15  Yes Historical Provider, MD  loperamide (IMODIUM) 2 MG capsule Take 2 mg by mouth 4 (four) times daily - after meals and at bedtime.    Yes Historical Provider, MD  metoprolol succinate (TOPROL-XL) 50 MG 24 hr tablet Take 50 mg by mouth daily. 04/14/15  Yes Historical Provider, MD  nitroGLYCERIN (NITROSTAT) 0.4 MG SL tablet Place 0.4 mg under the tongue every 5 (five) minutes as needed for chest pain.    Yes Historical Provider, MD  pantoprazole (PROTONIX) 40 MG tablet Take 40 mg by mouth daily.   Yes Historical Provider, MD  pravastatin (PRAVACHOL) 40 MG tablet Take 40 mg by mouth daily.   Yes Historical Provider, MD  simethicone (MYLICON) 081 MG chewable tablet Chew 125 mg by mouth every 6 (six) hours as needed for flatulence.   Yes Historical Provider, MD  tamsulosin (FLOMAX) 0.4 MG CAPS capsule Take 0.4 mg by mouth daily.   Yes Historical Provider, MD  traMADol (ULTRAM) 50 MG tablet Take 50 mg by mouth every 6 (six) hours as needed for severe pain.    Yes Historical Provider, MD  valACYclovir (VALTREX) 500 MG tablet Take 500 mg by mouth 2 (two) times daily.   Yes Historical Provider, MD  ciprofloxacin (CIPRO) 500 MG tablet Take 1 tablet by mouth 2 (two) times daily. For 5 days 04/18/15-04/23/15 04/18/15   Historical Provider, MD  levofloxacin (LEVAQUIN) 750 MG tablet Take 1 tablet (750 mg total) by mouth daily. Patient not taking: Reported on 05/02/2015 10/16/14   Junius Creamer, NP  metoprolol succinate (TOPROL-XL) 25 MG 24 hr tablet Take 12.5 mg by mouth daily.      Historical Provider, MD   BP 104/64 mmHg   Pulse 79  Temp(Src) 98.5 F (36.9 C) (Oral)  Resp 17  Ht 6\' 2"  (1.88 m)  Wt 134 lb (60.782 kg)  BMI 17.20 kg/m2  SpO2 99% Physical Exam  Constitutional: He is oriented to person, place, and time. He appears well-developed and well-nourished. No distress.  HENT:  Head: Normocephalic and atraumatic.  Eyes: Conjunctivae are normal. Pupils are equal, round, and reactive to light.  Cardiovascular: Normal rate, regular rhythm and normal heart sounds.   Pulmonary/Chest: Effort normal and breath sounds normal. No respiratory distress.  Abdominal: Soft. Bowel sounds are normal. There is generalized tenderness. There is CVA tenderness. There is no rigidity.  Musculoskeletal: He exhibits no edema or tenderness.  Neurological: He is alert and oriented to person, place, and time.  Skin: Skin is warm and dry. He is not diaphoretic.  Nursing note and vitals  reviewed.   ED Course  Procedures (including critical care time) Labs Review Labs Reviewed  COMPREHENSIVE METABOLIC PANEL - Abnormal; Notable for the following:    Glucose, Bld 121 (*)    BUN 28 (*)    Creatinine, Ser 1.89 (*)    Calcium 8.6 (*)    Albumin 3.2 (*)    AST 11 (*)    ALT 9 (*)    GFR calc non Af Amer 36 (*)    GFR calc Af Amer 42 (*)    All other components within normal limits  CBC WITH DIFFERENTIAL/PLATELET - Abnormal; Notable for the following:    RBC 2.34 (*)    Hemoglobin 6.4 (*)    HCT 20.4 (*)    RDW 17.1 (*)    All other components within normal limits  I-STAT CG4 LACTIC ACID, ED - Abnormal; Notable for the following:    Lactic Acid, Venous <0.30 (*)    All other components within normal limits  CULTURE, BLOOD (ROUTINE X 2)  CULTURE, BLOOD (ROUTINE X 2)  URINE CULTURE  URINE CULTURE  URINALYSIS, ROUTINE W REFLEX MICROSCOPIC (NOT AT [facility])  URINALYSIS, ROUTINE W REFLEX MICROSCOPIC (NOT AT [facility])  I-STAT CG4 LACTIC ACID, ED  POC OCCULT BLOOD, ED  PREPARE RBC (CROSSMATCH)  TYPE AND SCREEN  ABO/RH     Imaging Review Ct Abdomen Pelvis Wo Contrast  05/03/2015   CLINICAL DATA:  Abdominal pain, nausea and decreased urine output.  EXAM: CT ABDOMEN AND PELVIS WITHOUT CONTRAST  TECHNIQUE: Multidetector CT imaging of the abdomen and pelvis was performed following the standard protocol without IV contrast.  COMPARISON:  12/20/2013  FINDINGS: Lower chest:  Hypoventilatory changes, worse in the left lung base.  Hepatobiliary: No masses or other significant abnormality identified. Rim calcified 1.9 cm gallbladder stone is again seen.  Pancreas:  Normal unenhanced appearance.  Spleen: The spleen is enlarged, with peripheral calcification, likely posttraumatic.  Adrenal Glands:  No masses identified.  Kidneys/Urinary Tract: There is bilateral hydronephrosis, worse on the left, and moderate hydroureter. The dilated left ureter could be followed to the level of the vesicoureteral junction. The urinary bladder wall is diffusely thickened, however the urinary bladder is poorly evaluated due to lack of IV contrast and decompressed state, around urinary Foley. The prostate gland is not enlarged but contains coarse calcifications.  Stomach/Bowel/Peritoneum: No evidence of wall thickening, mass, or obstruction. There is a large amount of stool throughout the colon. Enterotomy lines also seen in the right lower abdomen.  Vascular/Lymphatic: No pathologically enlarged lymph nodes identified. There are heavy atherosclerotic calcifications of the aorta.  Other: Electronic apparatus is seen within the soft tissues of the right buttock, with lead terminating in right perirectal location.  Musculoskeletal:  No suspicious bone lesions identified.  IMPRESSION: Severe bilateral hydronephrosis and hydroureter, worse on the left. No evident reason is seen on this nonenhanced CT. The urinary bladder wall is diffusely thickened, however the urinary bladder is decompressed around a Foley catheter and therefore poorly evaluated. The prostate  gland is not particularly enlarged but contains multiple calcifications.  Large amount of stool throughout the colon, consistent with constipation.  1.9 cm rim calcified gallstone.  Heavy atherosclerotic disease of the aorta.  Splenomegaly.   Electronically Signed   By: Fidela Salisbury M.D.   On: 05/03/2015 01:24   I have personally reviewed and evaluated these images and lab results as part of my medical decision-making.   EKG Interpretation None      MDM  Final diagnoses:  Generalized abdominal pain  Decreased urination  CVA tenderness  Urinary tract infection associated with indwelling urethral catheter, subsequent encounter  Anemia, unspecified anemia type    Mathis Dad with history of stage IV rectal cancer and previous unresolved UTI, presents with generalized abdominal pain, CVA tenderness, decreased urination, and hematuria for the past 2-3 days. Findings and plan of care discussed with Gareth Morgan, MD.  10:11 PM Spoke to Gillian Scarce, pharmacist, for advice on ABX therapy since the urine culture on 9/22 grew out Stenotrophomonas, which was resistant to both bactrim and levofloxacin. Recommended getting ID involved due to failed previous ABX therapy and possible sepsis.  10:40 PM Dian Situ called back and said he spoke to an ID pharmacist and suggested getting a ID physician involved to discuss suscepibility and ABX options.  Will work on admitting patient because he has failed previous ABX therapy and has risk factors such as HIV. Will have hospitalist consult ID in the morning. Pain control and Zofran initiated. Abdominal CT ordered without IV contrast due to pt GFR of 36.  11:19 PM Hgb at 6.4. Explained risks and benefits for blood transfusion to patient. Pt acknowledged these, voiced comprehension, and agreed to blood transfusion.  Since pt is currently asymptomatic, will defer transfusion decision to hospitalist upon admission.   Chart review reveals pt last CD4  count was performed on 11/21/14 and was 22% with an absolute count of 255. His last viral load was "not detected" on 03/10/15. His viral load was last "detected" was on 12/12/13.  12:10 AM Awaiting imaging and remaining lab results prior to hospitalist consult. CMP values consistent with pt baseline.   12:30 AM Pt being transported to CT.  12:41 AM Pt back from CT. Will reorder morphine for pain management. Followed up with RN about performing lactate and UA.  1:03 AM Abdominal CT results still pending. Pt states his pain is back down to his baseline level. Lactic acid negative.   1:15 AM Rectal exam and hemocult performed. RN acted as Producer, television/film/video. Pt incontinent of stool, which he states has been the case for over a year.  No urinalysis or urine culture able to be obtained due to indwelling catheter a suspected source of infection. Nursing staff state they are unable to place or change foley catheters in the ED.  1:30 AM Abdominal CT results: IMPRESSION: Severe bilateral hydronephrosis and hydroureter, worse on the left. No evident reason is seen on this nonenhanced CT. The urinary bladder wall is diffusely thickened, however the urinary bladder is decompressed around a Foley catheter and therefore poorly evaluated. The prostate gland is not particularly enlarged but contains multiple calcifications.  Large amount of stool throughout the colon, consistent with constipation.  1.9 cm rim calcified gallstone.  Heavy atherosclerotic disease of the aorta.  Splenomegaly.   2:14 AM Abigail Butts, PA-C spoke with hospitalist, Dr. Arnoldo Morale, via phone.   2:35 AM Abigail Butts spoke with Urologist, Dr. Carlota Raspberry, via phone.  Lorayne Bender, PA-C 05/03/15 0246  Gareth Morgan, MD 05/05/15 [phone]

## 2015-05-03 ENCOUNTER — Inpatient Hospital Stay (HOSPITAL_COMMUNITY): Payer: Medicaid Other

## 2015-05-03 ENCOUNTER — Encounter (HOSPITAL_COMMUNITY): Payer: Self-pay | Admitting: *Deleted

## 2015-05-03 DIAGNOSIS — D649 Anemia, unspecified: Secondary | ICD-10-CM | POA: Diagnosis present

## 2015-05-03 DIAGNOSIS — I10 Essential (primary) hypertension: Secondary | ICD-10-CM | POA: Diagnosis present

## 2015-05-03 DIAGNOSIS — N1330 Unspecified hydronephrosis: Secondary | ICD-10-CM | POA: Diagnosis present

## 2015-05-03 DIAGNOSIS — C20 Malignant neoplasm of rectum: Secondary | ICD-10-CM | POA: Diagnosis present

## 2015-05-03 DIAGNOSIS — H269 Unspecified cataract: Secondary | ICD-10-CM | POA: Diagnosis present

## 2015-05-03 DIAGNOSIS — I4891 Unspecified atrial fibrillation: Secondary | ICD-10-CM | POA: Diagnosis present

## 2015-05-03 DIAGNOSIS — B20 Human immunodeficiency virus [HIV] disease: Secondary | ICD-10-CM | POA: Diagnosis present

## 2015-05-03 DIAGNOSIS — Z21 Asymptomatic human immunodeficiency virus [HIV] infection status: Secondary | ICD-10-CM | POA: Diagnosis not present

## 2015-05-03 DIAGNOSIS — N390 Urinary tract infection, site not specified: Secondary | ICD-10-CM | POA: Diagnosis present

## 2015-05-03 DIAGNOSIS — Z800 Family history of malignant neoplasm of digestive organs: Secondary | ICD-10-CM | POA: Diagnosis not present

## 2015-05-03 DIAGNOSIS — A419 Sepsis, unspecified organism: Secondary | ICD-10-CM | POA: Diagnosis not present

## 2015-05-03 DIAGNOSIS — R34 Anuria and oliguria: Secondary | ICD-10-CM | POA: Diagnosis present

## 2015-05-03 DIAGNOSIS — R1084 Generalized abdominal pain: Secondary | ICD-10-CM | POA: Diagnosis present

## 2015-05-03 DIAGNOSIS — Z7982 Long term (current) use of aspirin: Secondary | ICD-10-CM | POA: Diagnosis not present

## 2015-05-03 DIAGNOSIS — F17210 Nicotine dependence, cigarettes, uncomplicated: Secondary | ICD-10-CM | POA: Diagnosis present

## 2015-05-03 DIAGNOSIS — K219 Gastro-esophageal reflux disease without esophagitis: Secondary | ICD-10-CM | POA: Diagnosis present

## 2015-05-03 DIAGNOSIS — Z79899 Other long term (current) drug therapy: Secondary | ICD-10-CM | POA: Diagnosis not present

## 2015-05-03 DIAGNOSIS — Z87440 Personal history of urinary (tract) infections: Secondary | ICD-10-CM | POA: Diagnosis not present

## 2015-05-03 DIAGNOSIS — N179 Acute kidney failure, unspecified: Secondary | ICD-10-CM | POA: Diagnosis present

## 2015-05-03 DIAGNOSIS — N319 Neuromuscular dysfunction of bladder, unspecified: Secondary | ICD-10-CM | POA: Diagnosis present

## 2015-05-03 DIAGNOSIS — Z9889 Other specified postprocedural states: Secondary | ICD-10-CM | POA: Diagnosis not present

## 2015-05-03 DIAGNOSIS — C8330 Diffuse large B-cell lymphoma, unspecified site: Secondary | ICD-10-CM | POA: Diagnosis present

## 2015-05-03 DIAGNOSIS — T83511A Infection and inflammatory reaction due to indwelling urethral catheter, initial encounter: Secondary | ICD-10-CM

## 2015-05-03 DIAGNOSIS — E039 Hypothyroidism, unspecified: Secondary | ICD-10-CM | POA: Diagnosis present

## 2015-05-03 DIAGNOSIS — Z888 Allergy status to other drugs, medicaments and biological substances status: Secondary | ICD-10-CM | POA: Diagnosis not present

## 2015-05-03 DIAGNOSIS — F0390 Unspecified dementia without behavioral disturbance: Secondary | ICD-10-CM | POA: Diagnosis present

## 2015-05-03 DIAGNOSIS — G893 Neoplasm related pain (acute) (chronic): Secondary | ICD-10-CM | POA: Diagnosis present

## 2015-05-03 DIAGNOSIS — I2510 Atherosclerotic heart disease of native coronary artery without angina pectoris: Secondary | ICD-10-CM | POA: Diagnosis present

## 2015-05-03 LAB — URINALYSIS, ROUTINE W REFLEX MICROSCOPIC
BILIRUBIN URINE: NEGATIVE
GLUCOSE, UA: NEGATIVE mg/dL
KETONES UR: NEGATIVE mg/dL
Nitrite: NEGATIVE
PH: 6.5 (ref 5.0–8.0)
PROTEIN: 100 mg/dL — AB
Specific Gravity, Urine: 1.009 (ref 1.005–1.030)
Urobilinogen, UA: 0.2 mg/dL (ref 0.0–1.0)

## 2015-05-03 LAB — LACTIC ACID, PLASMA: Lactic Acid, Venous: 1 mmol/L (ref 0.5–2.0)

## 2015-05-03 LAB — CBC
HEMATOCRIT: 28.6 % — AB (ref 39.0–52.0)
HEMOGLOBIN: 9.1 g/dL — AB (ref 13.0–17.0)
MCH: 27.6 pg (ref 26.0–34.0)
MCHC: 31.8 g/dL (ref 30.0–36.0)
MCV: 86.7 fL (ref 78.0–100.0)
Platelets: 137 10*3/uL — ABNORMAL LOW (ref 150–400)
RBC: 3.3 MIL/uL — AB (ref 4.22–5.81)
RDW: 17.3 % — AB (ref 11.5–15.5)
WBC: 5.1 10*3/uL (ref 4.0–10.5)

## 2015-05-03 LAB — BASIC METABOLIC PANEL
ANION GAP: 6 (ref 5–15)
BUN: 24 mg/dL — AB (ref 6–20)
CHLORIDE: 107 mmol/L (ref 101–111)
CO2: 24 mmol/L (ref 22–32)
Calcium: 8.1 mg/dL — ABNORMAL LOW (ref 8.9–10.3)
Creatinine, Ser: 1.72 mg/dL — ABNORMAL HIGH (ref 0.61–1.24)
GFR calc Af Amer: 47 mL/min — ABNORMAL LOW (ref >60)
GFR calc non Af Amer: 41 mL/min — ABNORMAL LOW (ref >60)
Glucose, Bld: 118 mg/dL — ABNORMAL HIGH (ref 65–99)
POTASSIUM: 3.6 mmol/L (ref 3.5–5.1)
SODIUM: 137 mmol/L (ref 135–145)

## 2015-05-03 LAB — POC OCCULT BLOOD, ED: FECAL OCCULT BLD: NEGATIVE

## 2015-05-03 LAB — PROCALCITONIN: PROCALCITONIN: 0.25 ng/mL

## 2015-05-03 LAB — APTT: APTT: 35 s (ref 24–37)

## 2015-05-03 LAB — ABO/RH: ABO/RH(D): B NEG

## 2015-05-03 LAB — I-STAT CG4 LACTIC ACID, ED: Lactic Acid, Venous: <0.3 mmol/L — ABNORMAL LOW (ref 0.5–2.0)

## 2015-05-03 LAB — URINE MICROSCOPIC-ADD ON

## 2015-05-03 LAB — PROTIME-INR
INR: 1.34 (ref 0.00–1.49)
Prothrombin Time: 16.7 seconds — ABNORMAL HIGH (ref 11.6–15.2)

## 2015-05-03 LAB — OCCULT BLOOD X 1 CARD TO LAB, STOOL: Fecal Occult Bld: POSITIVE — AB

## 2015-05-03 MED ORDER — ONDANSETRON HCL 4 MG PO TABS: 4.0000 mg | ORAL_TABLET | Freq: Four times a day (QID) | ORAL | Status: DC | PRN

## 2015-05-03 MED ORDER — MORPHINE SULFATE (PF) 4 MG/ML IV SOLN
4.0000 mg | Freq: Once | INTRAVENOUS | Status: AC
  Administered 2015-05-03: 4 mg via INTRAVENOUS
  Filled 2015-05-03: qty 1

## 2015-05-03 MED ORDER — DOLUTEGRAVIR SODIUM 50 MG PO TABS
50.0000 mg | ORAL_TABLET | Freq: Every day | ORAL | Status: DC
  Administered 2015-05-03 – 2015-05-04 (×2): 50 mg via ORAL
  Filled 2015-05-03 (×2): qty 1

## 2015-05-03 MED ORDER — LOPERAMIDE HCL 2 MG PO CAPS
2.0000 mg | ORAL_CAPSULE | Freq: Three times a day (TID) | ORAL | Status: DC
  Administered 2015-05-03 – 2015-05-04 (×3): 2 mg via ORAL
  Filled 2015-05-03 (×3): qty 1

## 2015-05-03 MED ORDER — BOOST / RESOURCE BREEZE PO LIQD
1.0000 | Freq: Three times a day (TID) | ORAL | Status: DC
  Administered 2015-05-03: 1 via ORAL
  Administered 2015-05-03: 11:00:00 via ORAL

## 2015-05-03 MED ORDER — SODIUM CHLORIDE 0.9 % IJ SOLN
3.0000 mL | Freq: Two times a day (BID) | INTRAMUSCULAR | Status: DC
  Administered 2015-05-03: 3 mL via INTRAVENOUS

## 2015-05-03 MED ORDER — HYDROMORPHONE HCL 1 MG/ML IJ SOLN
0.5000 mg | INTRAMUSCULAR | Status: DC | PRN
  Administered 2015-05-03: 0.5 mg via INTRAVENOUS
  Filled 2015-05-03: qty 1

## 2015-05-03 MED ORDER — ACETAMINOPHEN 325 MG PO TABS: 650.0000 mg | ORAL_TABLET | Freq: Four times a day (QID) | ORAL | Status: DC | PRN

## 2015-05-03 MED ORDER — NITROGLYCERIN 0.4 MG SL SUBL: 0.4000 mg | SUBLINGUAL_TABLET | SUBLINGUAL | Status: DC | PRN

## 2015-05-03 MED ORDER — METOPROLOL SUCCINATE ER 50 MG PO TB24
50.0000 mg | ORAL_TABLET | Freq: Every day | ORAL | Status: DC
  Administered 2015-05-04: 50 mg via ORAL
  Filled 2015-05-03 (×2): qty 1

## 2015-05-03 MED ORDER — GABAPENTIN 300 MG PO CAPS
300.0000 mg | ORAL_CAPSULE | Freq: Three times a day (TID) | ORAL | Status: DC
  Administered 2015-05-03 – 2015-05-04 (×4): 300 mg via ORAL
  Filled 2015-05-03 (×4): qty 1

## 2015-05-03 MED ORDER — LEVOTHYROXINE SODIUM 112 MCG PO TABS
112.0000 ug | ORAL_TABLET | Freq: Every day | ORAL | Status: DC
  Administered 2015-05-03 – 2015-05-04 (×2): 112 ug via ORAL
  Filled 2015-05-03 (×2): qty 1

## 2015-05-03 MED ORDER — ACETAMINOPHEN 650 MG RE SUPP: 650.0000 mg | Freq: Four times a day (QID) | RECTAL | Status: DC | PRN

## 2015-05-03 MED ORDER — VANCOMYCIN HCL IN DEXTROSE 1-5 GM/200ML-% IV SOLN
1000.0000 mg | INTRAVENOUS | Status: DC
  Administered 2015-05-03: 1000 mg via INTRAVENOUS
  Filled 2015-05-03: qty 200

## 2015-05-03 MED ORDER — APIXABAN 2.5 MG PO TABS
5.0000 mg | ORAL_TABLET | Freq: Two times a day (BID) | ORAL | Status: DC
  Filled 2015-05-03: qty 2

## 2015-05-03 MED ORDER — INFLUENZA VAC SPLIT QUAD 0.5 ML IM SUSY
0.5000 mL | PREFILLED_SYRINGE | INTRAMUSCULAR | Status: AC
  Administered 2015-05-04: 0.5 mL via INTRAMUSCULAR
  Filled 2015-05-03 (×2): qty 0.5

## 2015-05-03 MED ORDER — ONDANSETRON HCL 4 MG/2ML IJ SOLN: 4.0000 mg | Freq: Three times a day (TID) | INTRAMUSCULAR | Status: AC | PRN

## 2015-05-03 MED ORDER — PIPERACILLIN-TAZOBACTAM 3.375 G IVPB
3.3750 g | Freq: Three times a day (TID) | INTRAVENOUS | Status: DC
  Administered 2015-05-03 – 2015-05-04 (×3): 3.375 g via INTRAVENOUS
  Filled 2015-05-03 (×2): qty 50

## 2015-05-03 MED ORDER — EMTRICITABINE-TENOFOVIR AF 200-25 MG PO TABS
1.0000 | ORAL_TABLET | Freq: Every day | ORAL | Status: DC
  Administered 2015-05-03 – 2015-05-04 (×2): 1 via ORAL
  Filled 2015-05-03 (×2): qty 1

## 2015-05-03 MED ORDER — TAMSULOSIN HCL 0.4 MG PO CAPS
0.4000 mg | ORAL_CAPSULE | Freq: Every day | ORAL | Status: DC
  Administered 2015-05-03 – 2015-05-04 (×2): 0.4 mg via ORAL
  Filled 2015-05-03 (×2): qty 1

## 2015-05-03 MED ORDER — PIPERACILLIN-TAZOBACTAM 3.375 G IVPB 30 MIN
3.3750 g | Freq: Once | INTRAVENOUS | Status: AC
  Administered 2015-05-03: 3.375 g via INTRAVENOUS
  Filled 2015-05-03: qty 50

## 2015-05-03 MED ORDER — HYDROMORPHONE HCL 2 MG PO TABS
2.0000 mg | ORAL_TABLET | ORAL | Status: DC | PRN
  Administered 2015-05-03: 2 mg via ORAL
  Filled 2015-05-03: qty 1

## 2015-05-03 MED ORDER — DIAZEPAM 5 MG PO TABS: 5.0000 mg | ORAL_TABLET | Freq: Every evening | ORAL | Status: DC | PRN

## 2015-05-03 MED ORDER — PRAVASTATIN SODIUM 40 MG PO TABS
40.0000 mg | ORAL_TABLET | Freq: Every day | ORAL | Status: DC
  Administered 2015-05-03: 40 mg via ORAL
  Filled 2015-05-03: qty 1

## 2015-05-03 MED ORDER — ONDANSETRON HCL 4 MG/2ML IJ SOLN: 4.0000 mg | Freq: Four times a day (QID) | INTRAMUSCULAR | Status: DC | PRN

## 2015-05-03 MED ORDER — ALUM & MAG HYDROXIDE-SIMETH 200-200-20 MG/5ML PO SUSP: 30.0000 mL | Freq: Four times a day (QID) | ORAL | Status: DC | PRN

## 2015-05-03 MED ORDER — HYDROMORPHONE HCL 1 MG/ML IJ SOLN
0.5000 mg | INTRAMUSCULAR | Status: DC | PRN
  Administered 2015-05-03: 1 mg via INTRAVENOUS
  Filled 2015-05-03: qty 1

## 2015-05-03 MED ORDER — MORPHINE SULFATE (PF) 4 MG/ML IV SOLN
4.0000 mg | INTRAVENOUS | Status: DC | PRN
  Administered 2015-05-03 – 2015-05-04 (×5): 4 mg via INTRAVENOUS
  Filled 2015-05-03 (×6): qty 1

## 2015-05-03 MED ORDER — VALACYCLOVIR HCL 500 MG PO TABS
500.0000 mg | ORAL_TABLET | Freq: Two times a day (BID) | ORAL | Status: DC
  Administered 2015-05-03 – 2015-05-04 (×3): 500 mg via ORAL
  Filled 2015-05-03 (×4): qty 1

## 2015-05-03 MED ORDER — DIPHENOXYLATE-ATROPINE 2.5-0.025 MG PO TABS
2.0000 | ORAL_TABLET | Freq: Four times a day (QID) | ORAL | Status: DC
Start: 2015-05-03 — End: 2015-05-04
  Administered 2015-05-03 – 2015-05-04 (×2): 2 via ORAL
  Filled 2015-05-03 (×2): qty 2

## 2015-05-03 MED ORDER — SODIUM CHLORIDE 0.9 % IV SOLN: Freq: Once | INTRAVENOUS | Status: DC

## 2015-05-03 MED ORDER — SODIUM CHLORIDE 0.9 % IV SOLN
INTRAVENOUS | Status: DC
  Administered 2015-05-03: 22:00:00 via INTRAVENOUS
  Administered 2015-05-03: 75 mL via INTRAVENOUS

## 2015-05-03 NOTE — Progress Notes (Signed)
Patient has several medications at the bedside. Pt request that medications not be sent down to the pharmacy. Pt stated that he prefers to have a friend take medications home this am.

## 2015-05-03 NOTE — Consult Note (Addendum)
UNASSIGNED PATIENT CROSS COVER [facility] Reason for Consult: Abdominal pain. Referring Physician: [facility]-Dr. Eleonore Chiquito, MD  Troy Holden is an 64 y.o. male.  HPI: Troy Holden is a 64 year old white male with multiple medical problems listed below along with HIV, rectal cancer s/p proctectomy at [facility] in 11/2013 after being treated with chemo and radiation in the latter part of 2014. He has also been treated for  DLBCL in 2012.He presented to the ED with complaints of increasing abdominal pain over the past 2 weeks with decreased urine output for the past48 hours. He was treated for for a UTI with Ciprofloxacin from 09/23 until 09/ 28. He has a chronic foley catheter since his proctectomy in May last year. After he was evaluated in the ER spsis workup was initiated and he was placed on IV Vancomycin and Zosyn. A CT scan of the abdomen was done and he was found to have bilateral hydronephrosis R>L. He reports having dark  stools off and on. His hemoglobin was also found to be 6.4 and had been 10.3 on 09/22, and an FOBT was done and was heme positive.He denies having any nausea, vomiting or frank hematochezia.   Past Medical History  Diagnosis Date  . Heart disease   . HIV (human immunodeficiency virus infection) ([place])   . Anemia   . Anxiety   . Blood transfusion   . Cataract   . Depression   . GERD (gastroesophageal reflux disease)   . Thyroid disease     low thyroid  . 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 Dementia 04/20/2011  . CAD (coronary artery disease) 04/20/2011  . Hypothyroidism 04/20/2011  . Dyslipidemia 04/20/2011  . Hypertension   . Dysrhythmia   . Shortness of breath   . Pneumonia   . Hepatitis   . DLBCL (diffuse large B cell lymphoma) ([place]) 04/20/2011   Past Surgical History  Procedure Laterality Date  . Cardiac surgery    . Abdominal surgery    . Colonoscopy    . Tumor removal      intestine  . Muscle stimulator       In lower back    Family History  Problem Relation Age of Onset  . Colon cancer Mother   . Colon cancer Brother    Social History:  reports that he has been smoking Cigarettes.  He has a 2.5 pack-year smoking history. He does not have any smokeless tobacco history on file. He reports that he does not drink alcohol or use illicit drugs.  Allergies:  Allergies  Allergen Reactions  . Lidocaine Hcl Rash    Patient had to be rushed to hospital from dentist office after receiving xylocaine  . Trazodone And Nefazodone Other (See Comments)    Nightmares, hangover like feeling for 2-3 days  . Oxycodone Other (See Comments) and Palpitations    Made him off balance and loopy Dizzy, nausea and tachycardia   Medications: I have reviewed the patient's current medications.  Results for orders placed or performed during the hospital encounter of 05/02/15 (from the past 48 hour(s))  Comprehensive metabolic panel     Status: Abnormal   Collection Time: 05/02/15 10:40 PM  Result Value Ref Range   Sodium 136 135 - 145 mmol/L   Potassium 3.8 3.5 - 5.1 mmol/L   Chloride 106 101 - 111 mmol/L   CO2 22 22 - 32 mmol/L   Glucose, Bld 121 (H) 65 - 99 mg/dL   BUN 28 (H) 6 -  20 mg/dL   Creatinine, Ser 1.89 (H) 0.61 - 1.24 mg/dL   Calcium 8.6 (L) 8.9 - 10.3 mg/dL   Total Protein 7.2 6.5 - 8.1 g/dL   Albumin 3.2 (L) 3.5 - 5.0 g/dL   AST 11 (L) 15 - 41 U/L   ALT 9 (L) 17 - 63 U/L   Alkaline Phosphatase 63 38 - 126 U/L   Total Bilirubin 0.4 0.3 - 1.2 mg/dL   GFR calc non Af Amer 36 (L) >60 mL/min   GFR calc Af Amer 42 (L) >60 mL/min    Comment: (NOTE) The eGFR has been calculated using the CKD EPI equation. This calculation has not been validated in all clinical situations. eGFR's persistently <60 mL/min signify possible Chronic Kidney Disease.    Anion gap 8 5 - 15  CBC WITH DIFFERENTIAL     Status: Abnormal   Collection Time: 05/02/15 10:40 PM  Result Value Ref Range   WBC 6.2 4.0 - 10.5 K/uL   RBC 2.34 (L)  4.22 - 5.81 MIL/uL   Hemoglobin 6.4 (LL) 13.0 - 17.0 g/dL    Comment: REPEATED TO VERIFY CRITICAL RESULT CALLED TO, READ BACK BY AND VERIFIED WITH: KERRY WARD RN 10.7.16 @ 2300 BY RICEJ    HCT 20.4 (L) 39.0 - 52.0 %   MCV 87.2 78.0 - 100.0 fL   MCH 27.4 26.0 - 34.0 pg   MCHC 31.4 30.0 - 36.0 g/dL   RDW 17.1 (H) 11.5 - 15.5 %   Platelets 171 150 - 400 K/uL   Neutrophils Relative % 70 %   Lymphocytes Relative 17 %   Monocytes Relative 11 %   Eosinophils Relative 2 %   Basophils Relative 0 %   Neutro Abs 4.3 1.7 - 7.7 K/uL   Lymphs Abs 1.1 0.7 - 4.0 K/uL   Monocytes Absolute 0.7 0.1 - 1.0 K/uL   Eosinophils Absolute 0.1 0.0 - 0.7 K/uL   Basophils Absolute 0.0 0.0 - 0.1 K/uL  Type and screen     Status: None (Preliminary result)   Collection Time: 05/02/15 11:29 PM  Result Value Ref Range   ABO/RH(D) B NEG    Antibody Screen NEG    Sample Expiration 05/05/2015    Unit Number [id number]    Blood Component Type RBC LR PHER2    Unit division 00    Status of Unit ISSUED    Transfusion Status OK TO TRANSFUSE    Crossmatch Result Compatible    Unit Number [id number]    Blood Component Type RBC LR PHER2    Unit division 00    Status of Unit ISSUED    Transfusion Status OK TO TRANSFUSE    Crossmatch Result Compatible   ABO/Rh     Status: None   Collection Time: 05/02/15 11:34 PM  Result Value Ref Range   ABO/RH(D) B NEG   Prepare RBC     Status: None   Collection Time: 05/02/15 11:39 PM  Result Value Ref Range   Order Confirmation ORDER PROCESSED BY BLOOD BANK   I-Stat CG4 Lactic Acid, ED  (not at  [facility])     Status: Abnormal   Collection Time: 05/03/15  1:02 AM  Result Value Ref Range   Lactic Acid, Venous <0.30 (L) 0.5 - 2.0 mmol/L  POC occult blood, ED Provider will collect     Status: None   Collection Time: 05/03/15  1:16 AM  Result Value Ref Range   Fecal Occult Bld NEGATIVE NEGATIVE  Urinalysis,  Routine w reflex microscopic (not at [facility])     Status: Abnormal    Collection Time: 05/03/15  4:58 AM  Result Value Ref Range   Color, Urine YELLOW YELLOW   APPearance TURBID (A) CLEAR   Specific Gravity, Urine 1.009 1.005 - 1.030   pH 6.5 5.0 - 8.0   Glucose, UA NEGATIVE NEGATIVE mg/dL   Hgb urine dipstick LARGE (A) NEGATIVE   Bilirubin Urine NEGATIVE NEGATIVE   Ketones, ur NEGATIVE NEGATIVE mg/dL   Protein, ur 100 (A) NEGATIVE mg/dL   Urobilinogen, UA 0.2 0.0 - 1.0 mg/dL   Nitrite NEGATIVE NEGATIVE   Leukocytes, UA LARGE (A) NEGATIVE  Urine microscopic-add on     Status: Abnormal   Collection Time: 05/03/15  4:58 AM  Result Value Ref Range   WBC, UA TOO NUMEROUS TO COUNT <3 WBC/hpf   RBC / HPF 21-50 <3 RBC/hpf   Bacteria, UA MANY (A) RARE   Urine-Other FIELD OBSCURED BY WBC'S   Occult blood card to lab, stool RN will collect     Status: Abnormal   Collection Time: 05/03/15  4:59 AM  Result Value Ref Range   Fecal Occult Bld POSITIVE (A) NEGATIVE  Lactic acid, plasma     Status: None   Collection Time: 05/03/15 10:30 AM  Result Value Ref Range   Lactic Acid, Venous 1.0 0.5 - 2.0 mmol/L  Procalcitonin     Status: None   Collection Time: 05/03/15 10:30 AM  Result Value Ref Range   Procalcitonin 0.25 ng/mL    Comment:        Interpretation: PCT (Procalcitonin) <= 0.5 ng/mL: Systemic infection (sepsis) is not likely. Local bacterial infection is possible. (NOTE)         ICU PCT Algorithm               Non ICU PCT Algorithm    ----------------------------     ------------------------------         PCT < 0.25 ng/mL                 PCT < 0.1 ng/mL     Stopping of antibiotics            Stopping of antibiotics       strongly encouraged.               strongly encouraged.    ----------------------------     ------------------------------       PCT level decrease by               PCT < 0.25 ng/mL       >= 80% from peak PCT       OR PCT 0.25 - 0.5 ng/mL          Stopping of antibiotics                                             encouraged.      Stopping of antibiotics           encouraged.    ----------------------------     ------------------------------       PCT level decrease by              PCT >= 0.25 ng/mL       < 80% from peak PCT        AND PCT >= 0.5  ng/mL            Continuin g antibiotics                                              encouraged.       Continuing antibiotics            encouraged.    ----------------------------     ------------------------------     PCT level increase compared          PCT > 0.5 ng/mL         with peak PCT AND          PCT >= 0.5 ng/mL             Escalation of antibiotics                                          strongly encouraged.      Escalation of antibiotics        strongly encouraged.   Protime-INR     Status: Abnormal   Collection Time: 05/03/15 10:30 AM  Result Value Ref Range   Prothrombin Time 16.7 (H) 11.6 - 15.2 seconds   INR 1.34 0.00 - 1.49  APTT     Status: None   Collection Time: 05/03/15 10:30 AM  Result Value Ref Range   aPTT 35 24 - 37 seconds  Basic metabolic panel     Status: Abnormal   Collection Time: 05/03/15 10:30 AM  Result Value Ref Range   Sodium 137 135 - 145 mmol/L   Potassium 3.6 3.5 - 5.1 mmol/L   Chloride 107 101 - 111 mmol/L   CO2 24 22 - 32 mmol/L   Glucose, Bld 118 (H) 65 - 99 mg/dL   BUN 24 (H) 6 - 20 mg/dL   Creatinine, Ser 1.72 (H) 0.61 - 1.24 mg/dL   Calcium 8.1 (L) 8.9 - 10.3 mg/dL   GFR calc non Af Amer 41 (L) >60 mL/min   GFR calc Af Amer 47 (L) >60 mL/min    Comment: (NOTE) The eGFR has been calculated using the CKD EPI equation. This calculation has not been validated in all clinical situations. eGFR's persistently <60 mL/min signify possible Chronic Kidney Disease.    Anion gap 6 5 - 15  CBC     Status: Abnormal   Collection Time: 05/03/15 10:30 AM  Result Value Ref Range   WBC 5.1 4.0 - 10.5 K/uL   RBC 3.30 (L) 4.22 - 5.81 MIL/uL   Hemoglobin 9.1 (L) 13.0 - 17.0 g/dL    Comment: DELTA CHECK NOTED POST  TRANSFUSION SPECIMEN    HCT 28.6 (L) 39.0 - 52.0 %   MCV 86.7 78.0 - 100.0 fL   MCH 27.6 26.0 - 34.0 pg   MCHC 31.8 30.0 - 36.0 g/dL   RDW 17.3 (H) 11.5 - 15.5 %   Platelets 137 (L) 150 - 400 K/uL   Ct Abdomen Pelvis Wo Contrast  05/03/2015   CLINICAL DATA:  Abdominal pain, nausea and decreased urine output.  EXAM: CT ABDOMEN AND PELVIS WITHOUT CONTRAST  TECHNIQUE: Multidetector CT imaging of the abdomen and pelvis was performed following the standard protocol without IV contrast.  COMPARISON:  12/20/2013  FINDINGS: Lower chest:  Hypoventilatory  changes, worse in the left lung base.  Hepatobiliary: No masses or other significant abnormality identified. Rim calcified 1.9 cm gallbladder stone is again seen.  Pancreas:  Normal unenhanced appearance.  Spleen: The spleen is enlarged, with peripheral calcification, likely posttraumatic.  Adrenal Glands:  No masses identified.  Kidneys/Urinary Tract: There is bilateral hydronephrosis, worse on the left, and moderate hydroureter. The dilated left ureter could be followed to the level of the vesicoureteral junction. The urinary bladder wall is diffusely thickened, however the urinary bladder is poorly evaluated due to lack of IV contrast and decompressed state, around urinary Foley. The prostate gland is not enlarged but contains coarse calcifications.  Stomach/Bowel/Peritoneum: No evidence of wall thickening, mass, or obstruction. There is a large amount of stool throughout the colon. Enterotomy lines also seen in the right lower abdomen.  Vascular/Lymphatic: No pathologically enlarged lymph nodes identified. There are heavy atherosclerotic calcifications of the aorta.  Other: Electronic apparatus is seen within the soft tissues of the right buttock, with lead terminating in right perirectal location.  Musculoskeletal:  No suspicious bone lesions identified.  IMPRESSION: Severe bilateral hydronephrosis and hydroureter, worse on the left. No evident reason is seen  on this nonenhanced CT. The urinary bladder wall is diffusely thickened, however the urinary bladder is decompressed around a Foley catheter and therefore poorly evaluated. The prostate gland is not particularly enlarged but contains multiple calcifications.  Large amount of stool throughout the colon, consistent with constipation.  1.9 cm rim calcified gallstone.  Heavy atherosclerotic disease of the aorta.  Splenomegaly.   Electronically Signed   By: Fidela Salisbury M.D.   On: 05/03/2015 01:24   US Renal  05/03/2015   CLINICAL DATA:  Bilateral flank pain over the last year with history of hydronephrosis. HIV positive.  EXAM: RENAL / URINARY TRACT ULTRASOUND COMPLETE  COMPARISON:  CT 05/03/2015. CT 12/20/2013. Multiple previous CT and ultrasound studies.  FINDINGS: Right Kidney:  Length: 1218 cm. Diffusely echogenic. Mild to moderate fullness of the right renal pelvis. Calices do not appear dilated. No visible stone.  Left Kidney:  Length: 14 cm in length. Diffusely echogenic. Markedly dilated renal pelvis and proximal ureter. Calices are dilated on this side.  Bladder:  Foley catheter in the bladder.  Thick walled bladder appear  IMPRESSION: Echogenic kidneys consistent with renal parenchymal disease. This can be seen with HIV.  Prominent extra renal pelvis on the right. Similar appearance to the study of 2015.  Worsened obstruction pattern on the left with dilated calices, extrarenal pelvis and ureter. This could be due to relative obstruction at a thick-walled bladder.   Electronically Signed   By: Nelson Chimes M.D.   On: 05/03/2015 09:49   Dg Chest Port 1 View  05/03/2015   CLINICAL DATA:  Acute onset of sepsis. Generalized body aches. Initial encounter.  EXAM: PORTABLE CHEST 1 VIEW  COMPARISON:  Chest radiograph performed 04/13/2013  FINDINGS: The lungs are well-aerated. Vascular congestion is noted. Increased interstitial markings may reflect interstitial edema or possibly atypical infection. No  pleural effusion or pneumothorax is seen.  The cardiomediastinal silhouette is within normal limits. No acute osseous abnormalities are seen.  IMPRESSION: Vascular congestion noted. Increased interstitial markings may reflect interstitial edema or possibly atypical infection.   Electronically Signed   By: Garald Balding M.D.   On: 05/03/2015 04:25   Review of Systems  Constitutional: Positive for malaise/fatigue. Negative for fever, chills, weight loss and diaphoresis.  HENT: Negative.   Eyes: Negative.  Respiratory: Negative.   Cardiovascular: Negative.   Gastrointestinal: Positive for heartburn, abdominal pain and constipation. Negative for nausea, vomiting, diarrhea, blood in stool and melena.  Genitourinary: Positive for dysuria.  Musculoskeletal: Positive for joint pain.  Skin: Negative.   Neurological: Positive for weakness.  Endo/Heme/Allergies: Negative.   Psychiatric/Behavioral: Negative.    Blood pressure 98/61, pulse 75, temperature 98.3 F (36.8 C), temperature source Oral, resp. rate 16, height 6' 2"  (1.88 m), weight 60.782 kg (134 lb), SpO2 99 %. Physical Exam  Assessment/Plan: 1) Anemia in 64 year old male s/p proctectomy for rectal cancer-heme positive stools on FOBT. Will need serial CBC's and close monitoring over the weekend. We will try to procure recent medical records from [place].   2) Neurogenic bladder with an indwelling Foley and recurrent UTIs; bilateral hydronephrosis on CT. BUN/Creatinine 28/1.89 3) HIV-on HAART.  4) History of DLBCL in 2012.  5) CAD/Dyslipidemia.  6) 1.9 cm calcified gallstone. Rudy Domek 05/03/2015, 8:12 PM

## 2015-05-03 NOTE — Consult Note (Signed)
Urology Consult   Physician requesting consult: Oswald Hillock, MD   Reason for consult: Right flank pain   History of Present Illness: Troy Holden is a 64 y.o. with a complex past medical history including rectal cancer s/p proctectomy at [facility] that led to neurogenic bladder with chronic indwelling foley catheter. The patient has had trouble with infectious disease and is followed by ID at [facility] as well. The patient has known renal dysfunction and hydronephrosis per notes from [facility]. The patient has also needed bilateral nephrostomy tubes in the past for renal failure. These were in place, as best as I can tell, during an acute illness.  The patient came to the ED on 05/02/15 with worsening abdominal and right flank pain over the last 2 weeks. It worsened and he noted less UOP and so came to the ED at [facility]. The patient was found to havea hgb of 6.4. He was also noted to have nitrite negative urine and a creatinine of 1.89 which appears to be his baseline for the last year. The patient denies any fevers at home.   Past Medical History  Diagnosis Date  . Heart disease   . HIV (human immunodeficiency virus infection) ([place])   . Anemia   . Anxiety   . Blood transfusion   . Cataract   . Depression   . GERD (gastroesophageal reflux disease)   . Thyroid disease     low thyroid  . 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 HIV (human immunodeficiency virus infection) ([place]) 04/20/2011  . Dementia 04/20/2011  . CAD (coronary artery disease) 04/20/2011  . Hypothyroidism 04/20/2011  . Dyslipidemia 04/20/2011  . Hypertension   . Dysrhythmia   . Shortness of breath   . Pneumonia   . Hepatitis   . Cancer ([place])     lymphoma  . DLBCL (diffuse large B cell lymphoma) ([place]) 04/20/2011    Past Surgical History  Procedure Laterality Date  . Cardiac surgery    . Abdominal surgery    . Colonoscopy    . Tumor removal      intestine  . Muscle stimulator       In lower back    Current  Hospital Medications:  Home Meds:    Medication List    ASK your doctor about these medications        apixaban 5 MG Tabs tablet  Commonly known as:  ELIQUIS  Take 5 mg by mouth 2 (two) times daily.     aspirin 81 MG chewable tablet  Chew 81 mg by mouth daily.     ciprofloxacin 500 MG tablet  Commonly known as:  CIPRO  Take 1 tablet by mouth 2 (two) times daily. For 5 days 04/18/15-04/23/15     DESCOVY 200-25 MG tablet  Generic drug:  emtricitabine-tenofovir AF  Take 1 tablet by mouth daily.     diazepam 5 MG tablet  Commonly known as:  VALIUM  Take 5 mg by mouth at bedtime as needed for anxiety.     diphenoxylate-atropine 2.5-0.025 MG tablet  Commonly known as:  LOMOTIL  Take 2 tablets by mouth 4 (four) times daily.     gabapentin 300 MG capsule  Commonly known as:  NEURONTIN  Take 300 mg by mouth 3 (three) times daily.     HYDROmorphone 2 MG tablet  Commonly known as:  DILAUDID  Take 2 mg by mouth every 4 (four) hours as needed for severe pain.     levofloxacin 750  MG tablet  Commonly known as:  LEVAQUIN  Take 1 tablet (750 mg total) by mouth daily.     levothyroxine 112 MCG tablet  Commonly known as:  SYNTHROID, LEVOTHROID  Take 112 mcg by mouth daily before breakfast.     lisinopril 20 MG tablet  Commonly known as:  PRINIVIL,ZESTRIL  Take 1 tablet by mouth daily.     loperamide 2 MG capsule  Commonly known as:  IMODIUM  Take 2 mg by mouth 4 (four) times daily - after meals and at bedtime.     metoprolol succinate 25 MG 24 hr tablet  Commonly known as:  TOPROL-XL  Take 12.5 mg by mouth daily.     metoprolol succinate 50 MG 24 hr tablet  Commonly known as:  TOPROL-XL  Take 50 mg by mouth daily.     nitroGLYCERIN 0.4 MG SL tablet  Commonly known as:  NITROSTAT  Place 0.4 mg under the tongue every 5 (five) minutes as needed for chest pain.     pantoprazole 40 MG tablet  Commonly known as:  PROTONIX  Take 40 mg by mouth daily.     pravastatin 40 MG  tablet  Commonly known as:  PRAVACHOL  Take 40 mg by mouth daily.     simethicone 125 MG chewable tablet  Commonly known as:  MYLICON  Chew 825 mg by mouth every 6 (six) hours as needed for flatulence.     tamsulosin 0.4 MG Caps capsule  Commonly known as:  FLOMAX  Take 0.4 mg by mouth daily.     TIVICAY 50 MG tablet  Generic drug:  dolutegravir  Take 50 mg by mouth daily.     traMADol 50 MG tablet  Commonly known as:  ULTRAM  Take 50 mg by mouth every 6 (six) hours as needed for severe pain.     valACYclovir 500 MG tablet  Commonly known as:  VALTREX  Take 500 mg by mouth 2 (two) times daily.        Scheduled Meds: . sodium chloride   Intravenous Once  . dolutegravir  50 mg Oral Daily  . emtricitabine-tenofovir AF  1 tablet Oral Daily  . feeding supplement  1 Container Oral TID BM  . gabapentin  300 mg Oral TID  . [START ON 05/04/2015] Influenza vac split quadrivalent PF  0.5 mL Intramuscular Tomorrow-1000  . levothyroxine  112 mcg Oral QAC breakfast  . metoprolol succinate  50 mg Oral Daily  . piperacillin-tazobactam  3.375 g Intravenous 3 times per day  . pravastatin  40 mg Oral Daily  . sodium chloride  3 mL Intravenous Q12H  . tamsulosin  0.4 mg Oral Daily  . valACYclovir  500 mg Oral BID  . vancomycin  1,000 mg Intravenous Q24H   Continuous Infusions: . sodium chloride 10 mL/hr at 05/03/15 0600   PRN Meds:.acetaminophen **OR** acetaminophen, alum & mag hydroxide-simeth, diazepam, HYDROmorphone (DILAUDID) injection, HYDROmorphone, morphine injection, nitroGLYCERIN, ondansetron (ZOFRAN) IV, ondansetron **OR** ondansetron (ZOFRAN) IV  Allergies:  Allergies  Allergen Reactions  . Lidocaine Hcl Rash    Patient had to be rushed to hospital from dentist office after receiving xylocaine  . Trazodone And Nefazodone Other (See Comments)    Nightmares, hangover like feeling for 2-3 days  . Oxycodone Other (See Comments) and Palpitations    Made him off balance and  loopy Dizzy, nausea and tachycardia    Family History  Problem Relation Age of Onset  . Colon cancer Mother   .  Colon cancer Brother     Social History:  reports that he has been smoking Cigarettes.  He has a 2.5 pack-year smoking history. He does not have any smokeless tobacco history on file. He reports that he does not drink alcohol or use illicit drugs.  ROS: A complete review of systems was performed.  All systems are negative except for pertinent findings as noted.  Physical Exam:  Vital signs in last 24 hours: Temp:  [98.1 F (36.7 C)-98.8 F (37.1 C)] 98.5 F (36.9 C) (10/08 1025) Pulse Rate:  [68-86] 76 (10/08 1025) Resp:  [12-19] 14 (10/08 1025) BP: (92-124)/(55-80) 95/55 mmHg (10/08 1025) SpO2:  [95 %-99 %] 96 % (10/08 1025) Weight:  [60.782 kg (134 lb)] 60.782 kg (134 lb) (10/07 2145) Constitutional:  Alert and oriented, No acute distress Cardiovascular: Regular rate and rhythm, No JVD Respiratory: Normal respiratory effort, Lungs clear bilaterally GI: Abdomen is soft, tender, nondistended, no abdominal masses, well healed midline incision  GU: Left CVA tenderness Neurologic: Grossly intact, no focal deficits Psychiatric: Normal mood and affect  Laboratory Data:   Recent Labs  05/02/15 2240  WBC 6.2  HGB 6.4*  HCT 20.4*  PLT 171     Recent Labs  05/02/15 2240  NA 136  K 3.8  CL 106  GLUCOSE 121*  BUN 28*  CALCIUM 8.6*  CREATININE 1.89*     Results for orders placed or performed during the hospital encounter of 05/02/15 (from the past 24 hour(s))  Comprehensive metabolic panel     Status: Abnormal   Collection Time: 05/02/15 10:40 PM  Result Value Ref Range   Sodium 136 135 - 145 mmol/L   Potassium 3.8 3.5 - 5.1 mmol/L   Chloride 106 101 - 111 mmol/L   CO2 22 22 - 32 mmol/L   Glucose, Bld 121 (H) 65 - 99 mg/dL   BUN 28 (H) 6 - 20 mg/dL   Creatinine, Ser 1.89 (H) 0.61 - 1.24 mg/dL   Calcium 8.6 (L) 8.9 - 10.3 mg/dL   Total Protein 7.2  6.5 - 8.1 g/dL   Albumin 3.2 (L) 3.5 - 5.0 g/dL   AST 11 (L) 15 - 41 U/L   ALT 9 (L) 17 - 63 U/L   Alkaline Phosphatase 63 38 - 126 U/L   Total Bilirubin 0.4 0.3 - 1.2 mg/dL   GFR calc non Af Amer 36 (L) >60 mL/min   GFR calc Af Amer 42 (L) >60 mL/min   Anion gap 8 5 - 15  CBC WITH DIFFERENTIAL     Status: Abnormal   Collection Time: 05/02/15 10:40 PM  Result Value Ref Range   WBC 6.2 4.0 - 10.5 K/uL   RBC 2.34 (L) 4.22 - 5.81 MIL/uL   Hemoglobin 6.4 (LL) 13.0 - 17.0 g/dL   HCT 20.4 (L) 39.0 - 52.0 %   MCV 87.2 78.0 - 100.0 fL   MCH 27.4 26.0 - 34.0 pg   MCHC 31.4 30.0 - 36.0 g/dL   RDW 17.1 (H) 11.5 - 15.5 %   Platelets 171 150 - 400 K/uL   Neutrophils Relative % 70 %   Lymphocytes Relative 17 %   Monocytes Relative 11 %   Eosinophils Relative 2 %   Basophils Relative 0 %   Neutro Abs 4.3 1.7 - 7.7 K/uL   Lymphs Abs 1.1 0.7 - 4.0 K/uL   Monocytes Absolute 0.7 0.1 - 1.0 K/uL   Eosinophils Absolute 0.1 0.0 - 0.7 K/uL   Basophils  Absolute 0.0 0.0 - 0.1 K/uL  Type and screen     Status: None (Preliminary result)   Collection Time: 05/02/15 11:29 PM  Result Value Ref Range   ABO/RH(D) B NEG    Antibody Screen NEG    Sample Expiration 05/05/2015    Unit Number [id number]    Blood Component Type RBC LR PHER2    Unit division 00    Status of Unit ISSUED    Transfusion Status OK TO TRANSFUSE    Crossmatch Result Compatible    Unit Number [id number]    Blood Component Type RBC LR PHER2    Unit division 00    Status of Unit ISSUED    Transfusion Status OK TO TRANSFUSE    Crossmatch Result Compatible   ABO/Rh     Status: None   Collection Time: 05/02/15 11:34 PM  Result Value Ref Range   ABO/RH(D) B NEG   Prepare RBC     Status: None   Collection Time: 05/02/15 11:39 PM  Result Value Ref Range   Order Confirmation ORDER PROCESSED BY BLOOD BANK   I-Stat CG4 Lactic Acid, ED  (not at  [facility])     Status: Abnormal   Collection Time: 05/03/15  1:02 AM  Result Value Ref  Range   Lactic Acid, Venous <0.30 (L) 0.5 - 2.0 mmol/L  POC occult blood, ED Provider will collect     Status: None   Collection Time: 05/03/15  1:16 AM  Result Value Ref Range   Fecal Occult Bld NEGATIVE NEGATIVE  Urinalysis, Routine w reflex microscopic (not at [facility])     Status: Abnormal   Collection Time: 05/03/15  4:58 AM  Result Value Ref Range   Color, Urine YELLOW YELLOW   APPearance TURBID (A) CLEAR   Specific Gravity, Urine 1.009 1.005 - 1.030   pH 6.5 5.0 - 8.0   Glucose, UA NEGATIVE NEGATIVE mg/dL   Hgb urine dipstick LARGE (A) NEGATIVE   Bilirubin Urine NEGATIVE NEGATIVE   Ketones, ur NEGATIVE NEGATIVE mg/dL   Protein, ur 100 (A) NEGATIVE mg/dL   Urobilinogen, UA 0.2 0.0 - 1.0 mg/dL   Nitrite NEGATIVE NEGATIVE   Leukocytes, UA LARGE (A) NEGATIVE  Urine microscopic-add on     Status: Abnormal   Collection Time: 05/03/15  4:58 AM  Result Value Ref Range   WBC, UA TOO NUMEROUS TO COUNT <3 WBC/hpf   RBC / HPF 21-50 <3 RBC/hpf   Bacteria, UA MANY (A) RARE   Urine-Other FIELD OBSCURED BY WBC'S   Occult blood card to lab, stool RN will collect     Status: Abnormal   Collection Time: 05/03/15  4:59 AM  Result Value Ref Range   Fecal Occult Bld POSITIVE (A) NEGATIVE   No results found for this or any previous visit (from the past 240 hour(s)).  Renal Function:  Recent Labs  05/02/15 2240  CREATININE 1.89*   Estimated Creatinine Clearance: 34.4 mL/min (by C-G formula based on Cr of 1.89).  Radiologic Imaging: Ct Abdomen Pelvis Wo Contrast  05/03/2015   CLINICAL DATA:  Abdominal pain, nausea and decreased urine output.  EXAM: CT ABDOMEN AND PELVIS WITHOUT CONTRAST  TECHNIQUE: Multidetector CT imaging of the abdomen and pelvis was performed following the standard protocol without IV contrast.  COMPARISON:  12/20/2013  FINDINGS: Lower chest:  Hypoventilatory changes, worse in the left lung base.  Hepatobiliary: No masses or other significant abnormality identified. Rim  calcified 1.9 cm gallbladder stone is again seen.  Pancreas:  Normal unenhanced appearance.  Spleen: The spleen is enlarged, with peripheral calcification, likely posttraumatic.  Adrenal Glands:  No masses identified.  Kidneys/Urinary Tract: There is bilateral hydronephrosis, worse on the left, and moderate hydroureter. The dilated left ureter could be followed to the level of the vesicoureteral junction. The urinary bladder wall is diffusely thickened, however the urinary bladder is poorly evaluated due to lack of IV contrast and decompressed state, around urinary Foley. The prostate gland is not enlarged but contains coarse calcifications.  Stomach/Bowel/Peritoneum: No evidence of wall thickening, mass, or obstruction. There is a large amount of stool throughout the colon. Enterotomy lines also seen in the right lower abdomen.  Vascular/Lymphatic: No pathologically enlarged lymph nodes identified. There are heavy atherosclerotic calcifications of the aorta.  Other: Electronic apparatus is seen within the soft tissues of the right buttock, with lead terminating in right perirectal location.  Musculoskeletal:  No suspicious bone lesions identified.  IMPRESSION: Severe bilateral hydronephrosis and hydroureter, worse on the left. No evident reason is seen on this nonenhanced CT. The urinary bladder wall is diffusely thickened, however the urinary bladder is decompressed around a Foley catheter and therefore poorly evaluated. The prostate gland is not particularly enlarged but contains multiple calcifications.  Large amount of stool throughout the colon, consistent with constipation.  1.9 cm rim calcified gallstone.  Heavy atherosclerotic disease of the aorta.  Splenomegaly.   Electronically Signed   By: Fidela Salisbury M.D.   On: 05/03/2015 01:24   US Renal  05/03/2015   CLINICAL DATA:  Bilateral flank pain over the last year with history of hydronephrosis. HIV positive.  EXAM: RENAL / URINARY TRACT ULTRASOUND  COMPLETE  COMPARISON:  CT 05/03/2015. CT 12/20/2013. Multiple previous CT and ultrasound studies.  FINDINGS: Right Kidney:  Length: 1218 cm. Diffusely echogenic. Mild to moderate fullness of the right renal pelvis. Calices do not appear dilated. No visible stone.  Left Kidney:  Length: 14 cm in length. Diffusely echogenic. Markedly dilated renal pelvis and proximal ureter. Calices are dilated on this side.  Bladder:  Foley catheter in the bladder.  Thick walled bladder appear  IMPRESSION: Echogenic kidneys consistent with renal parenchymal disease. This can be seen with HIV.  Prominent extra renal pelvis on the right. Similar appearance to the study of 2015.  Worsened obstruction pattern on the left with dilated calices, extrarenal pelvis and ureter. This could be due to relative obstruction at a thick-walled bladder.   Electronically Signed   By: Nelson Chimes M.D.   On: 05/03/2015 09:49   Dg Chest Port 1 View  05/03/2015   CLINICAL DATA:  Acute onset of sepsis. Generalized body aches. Initial encounter.  EXAM: PORTABLE CHEST 1 VIEW  COMPARISON:  Chest radiograph performed 04/13/2013  FINDINGS: The lungs are well-aerated. Vascular congestion is noted. Increased interstitial markings may reflect interstitial edema or possibly atypical infection. No pleural effusion or pneumothorax is seen.  The cardiomediastinal silhouette is within normal limits. No acute osseous abnormalities are seen.  IMPRESSION: Vascular congestion noted. Increased interstitial markings may reflect interstitial edema or possibly atypical infection.   Electronically Signed   By: Garald Balding M.D.   On: 05/03/2015 04:25    I independently reviewed the above imaging studies.  Impression/Recommendation 64 y.o. male with complex history including rectal cancer that led to a proctectomy that led to neurogenic bladder managed with chronic indwelling foley which led to trouble with resistant infections. The patient is followed mostly at [facility]  by oncology, GI  surgery, urology and infectious disease. The patient is stable at this time, he has needed nephrostomy tubes in the past but the patient would like to avoid these if possible. This is reasonable at this time, however, the patient understands that if his situation were to deteriorate then neph tubes would be necessary. At this time with the patient stable he is likely safe to observe. Infectious disease would likely be helpful to guide antibiotic choices and regimens. The patient's renal function appears to be at his baseline.  Christell Faith 05/03/2015, 10:45 AM

## 2015-05-03 NOTE — Progress Notes (Signed)
Order received to replace foley catheter. Pt currently had 78 F placed prior to admission. Verified with secondary RN. Foley discontinued and replaced with 16 F.

## 2015-05-03 NOTE — Progress Notes (Signed)
Subjective: Patient admitted this morning, see detailed H&P by Dr Arnoldo Morale. 64 year old male with a history of HIV, rectal cancer status post proctectomy and [facility], neurogenic bladder with indwelling Foley catheter came to the hospital with increasing abdominal pain with decreased urination. Patient found to be in sepsis with UTI, symptomatically anemia with hemoglobin 6.4. FOBT positive Patient denies any complaints at this time Filed Vitals:   05/03/15 1025  BP: 95/55  Pulse: 76  Temp: 98.5 F (36.9 C)  Resp: 14    Chest: Clear Bilaterally Heart : S1S2 RRR Abdomen: Soft, nontender Ext : No edema Neuro: Alert, oriented x 3  A/P  Anemia with positive FOBT Sepsis due to UTI Neurogenic bladder Bilateral hydronephrosis  GI consult Urology consult Status post 2 units PRBC, repeat hemoglobin 9.1 Continue IV Zosyn Follow urine culture results    Oswald Hillock [facility] Pager- [phone]

## 2015-05-03 NOTE — H&P (Addendum)
[facility] Admission History and Physical       Troy Holden BDZ:[id number] DOB: 02/25/1951 DOA: 05/02/2015  Referring physician: EDP PCP: [facility] Dr Doreene Nest Specialists:   Chief Complaint: Increased ABD Pain and Decreased Urination  HPI: Troy Holden is a 64 y.o. male with a history of  HIV Disease, Rectal Cancer, DLBCL, CAD,  HTN, Hypothyroid who presents to the ED with complaints of increased ABD pain progressing over the past 2 weeks with decreased urine output for the past  2 days.  He was evaluated and placed on antibiotic rx for a UTI with Cipro from 09/23 until 09/ 28.  He has a chronic foley catheter as well.   This evening he was  Seen and a Sepsis workup was initiated and he was placed on IV Vancomycin and Zosyn.   A CT scan of the ABD was performed and revealed  Bilateral Hydronephrosis R>L.   A Urology consultation was placed by the EDP.    He reports having Black stools off and on.  His Hemoglobin was also found to be 6.4 and had been 10.3 on 09/22, and an FOBT was done and was HEME negative.  2 units of PRBCs were ordered for transfusion.       Review of Systems:  Constitutional: No Weight Loss, No Weight Gain, Night Sweats, Fevers, Chills, Dizziness, Light Headedness, +Fatigue, +Generalized Weakness HEENT: No Headaches, Difficulty Swallowing,Tooth/Dental Problems,Sore Throat,  No Sneezing, Rhinitis, Ear Ache, Nasal Congestion, or Post Nasal Drip,  Cardio-vascular:  No Chest pain, Orthopnea, PND, Edema in Lower Extremities, Anasarca, Dizziness, Palpitations  Resp: No Dyspnea, No DOE, No Productive Cough, No Non-Productive Cough, No Hemoptysis, No Wheezing.    GI: No Heartburn, Indigestion, Abdominal Pain, Nausea, Vomiting, Diarrhea, Constipation, Hematemesis, Hematochezia, Melena, Change in Bowel Habits,  Loss of Appetite  GU: No Dysuria, No Change in Color of Urine, No Urgency or Urinary Frequency, No Flank pain.  Musculoskeletal: No Joint Pain or  Swelling, No Decreased Range of Motion, No Back Pain.  Neurologic: No Syncope, No Seizures, Muscle Weakness, Paresthesia, Vision Disturbance or Loss, No Diplopia, No Vertigo, No Difficulty Walking,  Skin: No Rash or Lesions. Psych: No Change in Mood or Affect, No Depression or Anxiety, No Memory loss, No Confusion, or Hallucinations   Past Medical History  Diagnosis Date  . Heart disease   . HIV (human immunodeficiency virus infection) ([place])   . Anemia   . Anxiety   . Blood transfusion   . Cataract   . Depression   . GERD (gastroesophageal reflux disease)   . Thyroid disease     low thyroid  . 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 HIV (human immunodeficiency virus infection) ([place]) 04/20/2011  . Dementia 04/20/2011  . CAD (coronary artery disease) 04/20/2011  . Hypothyroidism 04/20/2011  . Dyslipidemia 04/20/2011  . Hypertension   . Dysrhythmia   . Shortness of breath   . Pneumonia   . Hepatitis   . Cancer ([place])     lymphoma  . DLBCL (diffuse large B cell lymphoma) ([place]) 04/20/2011     Past Surgical History  Procedure Laterality Date  . Cardiac surgery    . Abdominal surgery    . Colonoscopy    . Tumor removal      intestine  . Muscle stimulator       In lower back      Prior to Admission medications   Medication Sig Start Date End Date Taking? Authorizing Provider  apixaban (ELIQUIS) 5  MG TABS tablet Take 5 mg by mouth 2 (two) times daily.   Yes Historical Provider, MD  aspirin 81 MG chewable tablet Chew 81 mg by mouth daily.   Yes Historical Provider, MD  DESCOVY 200-25 MG tablet Take 1 tablet by mouth daily. 04/18/15  Yes Historical Provider, MD  diazepam (VALIUM) 5 MG tablet Take 5 mg by mouth at bedtime as needed for anxiety.   Yes Historical Provider, MD  diphenoxylate-atropine (LOMOTIL) 2.5-0.025 MG per tablet Take 2 tablets by mouth 4 (four) times daily.    Yes Historical Provider, MD  dolutegravir (TIVICAY) 50 MG tablet Take 50 mg by mouth daily.    Yes Historical Provider, MD  gabapentin (NEURONTIN) 300 MG capsule Take 300 mg by mouth 3 (three) times daily.   Yes Historical Provider, MD  HYDROmorphone (DILAUDID) 2 MG tablet Take 2 mg by mouth every 4 (four) hours as needed for severe pain.   Yes Historical Provider, MD  levothyroxine (SYNTHROID, LEVOTHROID) 112 MCG tablet Take 112 mcg by mouth daily before breakfast.   Yes Historical Provider, MD  lisinopril (PRINIVIL,ZESTRIL) 20 MG tablet Take 1 tablet by mouth daily. 03/14/15  Yes Historical Provider, MD  loperamide (IMODIUM) 2 MG capsule Take 2 mg by mouth 4 (four) times daily - after meals and at bedtime.    Yes Historical Provider, MD  metoprolol succinate (TOPROL-XL) 50 MG 24 hr tablet Take 50 mg by mouth daily. 04/14/15  Yes Historical Provider, MD  nitroGLYCERIN (NITROSTAT) 0.4 MG SL tablet Place 0.4 mg under the tongue every 5 (five) minutes as needed for chest pain.    Yes Historical Provider, MD  pantoprazole (PROTONIX) 40 MG tablet Take 40 mg by mouth daily.   Yes Historical Provider, MD  pravastatin (PRAVACHOL) 40 MG tablet Take 40 mg by mouth daily.   Yes Historical Provider, MD  simethicone (MYLICON) 638 MG chewable tablet Chew 125 mg by mouth every 6 (six) hours as needed for flatulence.   Yes Historical Provider, MD  tamsulosin (FLOMAX) 0.4 MG CAPS capsule Take 0.4 mg by mouth daily.   Yes Historical Provider, MD  traMADol (ULTRAM) 50 MG tablet Take 50 mg by mouth every 6 (six) hours as needed for severe pain.    Yes Historical Provider, MD  valACYclovir (VALTREX) 500 MG tablet Take 500 mg by mouth 2 (two) times daily.   Yes Historical Provider, MD  ciprofloxacin (CIPRO) 500 MG tablet Take 1 tablet by mouth 2 (two) times daily. For 5 days 04/18/15-04/23/15 04/18/15   Historical Provider, MD  levofloxacin (LEVAQUIN) 750 MG tablet Take 1 tablet (750 mg total) by mouth daily. Patient not taking: Reported on 05/02/2015 10/16/14   Junius Creamer, NP  metoprolol succinate (TOPROL-XL) 25 MG 24  hr tablet Take 12.5 mg by mouth daily.      Historical Provider, MD     Allergies  Allergen Reactions  . Lidocaine Hcl Rash    Patient had to be rushed to hospital from dentist office after receiving xylocaine  . Trazodone And Nefazodone Other (See Comments)    Nightmares, hangover like feeling for 2-3 days  . Oxycodone Other (See Comments) and Palpitations    Made him off balance and loopy Dizzy, nausea and tachycardia    Social History:  reports that he has been smoking Cigarettes.  He has a 2.5 pack-year smoking history. He does not have any smokeless tobacco history on file. He reports that he does not drink alcohol or use illicit drugs.  Family History  Problem Relation Age of Onset  . Colon cancer Mother   . Colon cancer Brother        Physical Exam:  GEN:  Pleasant Thin  64 y.o. Caucasian male examined and in no acute distress; cooperative with exam Filed Vitals:   05/03/15 0200 05/03/15 0217 05/03/15 0227 05/03/15 0245  BP: 100/60  104/64 97/62  Pulse: 74  79   Temp:  98.5 F (36.9 C)  98.4 F (36.9 C)  TempSrc:  Oral  Oral  Resp: 12  17   Height:      Weight:      SpO2: 96%  99%    Blood pressure 97/62, pulse 79, temperature 98.4 F (36.9 C), temperature source Oral, resp. rate 17, height 6\' 2"  (1.88 m), weight 60.782 kg (134 lb), SpO2 99 %. PSYCH: He is alert and oriented x4; does not appear anxious does not appear depressed; affect is normal HEENT: Normocephalic and Atraumatic, Mucous membranes pink; PERRLA; EOM intact; Fundi:  Benign;  No scleral icterus, Nares: Patent, Oropharynx: Clear, Poor Dentition,    Neck:  FROM, No Cervical Lymphadenopathy nor Thyromegaly or Carotid Bruit; No JVD; Breasts:: Not examined CHEST WALL: No tenderness CHEST: Normal respiration, clear to auscultation bilaterally HEART: Regular rate and rhythm; no murmurs rubs or gallops BACK: No kyphosis or scoliosis; No CVA tenderness ABDOMEN: Positive Bowel Sounds, Scaphoid,  Soft  Non-Tender, No Rebound or Guarding; No Masses, No Organomegaly Rectal Exam: Not done EXTREMITIES: NoCyanosis, Clubbing, or Edema; No Ulcerations. Genitalia: not examined PULSES: 2+ and symmetric SKIN: Normal hydration no rash or ulceration CNS:  Alert and Oriented x 4, No Focal Deficits Vascular: pulses palpable throughout    Labs on Admission:  Basic Metabolic Panel:  Recent Labs Lab 05/02/15 2240  NA 136  K 3.8  CL 106  CO2 22  GLUCOSE 121*  BUN 28*  CREATININE 1.89*  CALCIUM 8.6*   Liver Function Tests:  Recent Labs Lab 05/02/15 2240  AST 11*  ALT 9*  ALKPHOS 63  BILITOT 0.4  PROT 7.2  ALBUMIN 3.2*   No results for input(s): LIPASE, AMYLASE in the last 168 hours. No results for input(s): AMMONIA in the last 168 hours. CBC:  Recent Labs Lab 05/02/15 2240  WBC 6.2  NEUTROABS 4.3  HGB 6.4*  HCT 20.4*  MCV 87.2  PLT 171   Cardiac Enzymes: No results for input(s): CKTOTAL, CKMB, CKMBINDEX, TROPONINI in the last 168 hours.  BNP (last 3 results) No results for input(s): BNP in the last 8760 hours.  ProBNP (last 3 results) No results for input(s): PROBNP in the last 8760 hours.  CBG: No results for input(s): GLUCAP in the last 168 hours.  Radiological Exams on Admission: Ct Abdomen Pelvis Wo Contrast  05/03/2015   CLINICAL DATA:  Abdominal pain, nausea and decreased urine output.  EXAM: CT ABDOMEN AND PELVIS WITHOUT CONTRAST  TECHNIQUE: Multidetector CT imaging of the abdomen and pelvis was performed following the standard protocol without IV contrast.  COMPARISON:  12/20/2013  FINDINGS: Lower chest:  Hypoventilatory changes, worse in the left lung base.  Hepatobiliary: No masses or other significant abnormality identified. Rim calcified 1.9 cm gallbladder stone is again seen.  Pancreas:  Normal unenhanced appearance.  Spleen: The spleen is enlarged, with peripheral calcification, likely posttraumatic.  Adrenal Glands:  No masses identified.   Kidneys/Urinary Tract: There is bilateral hydronephrosis, worse on the left, and moderate hydroureter. The dilated left ureter could be followed to the level  of the vesicoureteral junction. The urinary bladder wall is diffusely thickened, however the urinary bladder is poorly evaluated due to lack of IV contrast and decompressed state, around urinary Foley. The prostate gland is not enlarged but contains coarse calcifications.  Stomach/Bowel/Peritoneum: No evidence of wall thickening, mass, or obstruction. There is a large amount of stool throughout the colon. Enterotomy lines also seen in the right lower abdomen.  Vascular/Lymphatic: No pathologically enlarged lymph nodes identified. There are heavy atherosclerotic calcifications of the aorta.  Other: Electronic apparatus is seen within the soft tissues of the right buttock, with lead terminating in right perirectal location.  Musculoskeletal:  No suspicious bone lesions identified.  IMPRESSION: Severe bilateral hydronephrosis and hydroureter, worse on the left. No evident reason is seen on this nonenhanced CT. The urinary bladder wall is diffusely thickened, however the urinary bladder is decompressed around a Foley catheter and therefore poorly evaluated. The prostate gland is not particularly enlarged but contains multiple calcifications.  Large amount of stool throughout the colon, consistent with constipation.  1.9 cm rim calcified gallstone.  Heavy atherosclerotic disease of the aorta.  Splenomegaly.   Electronically Signed   By: Fidela Salisbury M.D.   On: 05/03/2015 01:24     EKG: Independently reviewed. Normal Sinus Rhythm RAte = 78   Assessment/Plan:      64 y.o. male with  Principal Problem:   1.    Symptomatic anemia- due to GI Loss or Due to Cancer Hx, or due to Medication (Eliquis), FOBT in ED was HEME Negative   Transfuse 2 units   Monitor H/Hs   IV Protonix   Check FOBT daily x 3   May Need GI consultation   Active Problems:    2.    Sepsis ([place])   Sepsis Workup Initiated    Site(s) Urine from Chronic Foley   IV Vancomycin and Zosyn        3.    AKI (acute kidney injury) ([place])- due to UTI, and Bilateral Hydronephrosis R>L   Urology consultation Per EDP   IVFs   Monitor BUN/Cr       4.    Bilalteral Hydronephrosis   Urology consulted   Renal US in AM     5.    Generalized abdominal pain- Multifactorial, due to Cancer   Pain Control PRn with IV Dilaudid      6.    Urinary tract infection associated with indwelling urethral catheter   IV Abxs   Urine for Culture   Foley Catheter Change     7.    Decreased urination - due to ? Obstruction    Renal US   Urology Consult     8.    HIV (human immunodeficiency virus infection) ([place])- reports last CD4 = 255 in 02/2015   Check CD4   Continue current HAART Rx     9.    Hypothyroidism   Continue Levothyroxine Rx   Check TSH    10.    DLBCL HX       11.    DVT Prophylaxis   On Eliquis Rx    Code Status:     FULL CODE   Family Communication:    No Family Present    Disposition Plan:    Inpatient Status        Time spent:  [facility] Pager [phone]   If [place] Please Contact the Day Rounding Team MD for  [facility]  If 7PM-7AM, Please Contact Night-Floor Coverage  [email or web address] Password [email or web address] 05/03/2015, 3:03 AM     ADDENDUM:   Patient was seen and examined on 05/03/2015

## 2015-05-03 NOTE — Progress Notes (Signed)
ANTIBIOTIC CONSULT NOTE - INITIAL  Pharmacy Consult for Zosyn/Vancomycin Indication: Sepsis  Allergies  Allergen Reactions  . Lidocaine Hcl Rash    Patient had to be rushed to hospital from dentist office after receiving xylocaine  . Trazodone And Nefazodone Other (See Comments)    Nightmares, hangover like feeling for 2-3 days  . Oxycodone Other (See Comments) and Palpitations    Made him off balance and loopy Dizzy, nausea and tachycardia    Patient Measurements: Height: 6\' 2"  (188 cm) Weight: 134 lb (60.782 kg) IBW/kg (Calculated) : 82.2   Vital Signs: Temp: 98.1 F (36.7 C) (10/08 0310) Temp Source: Oral (10/08 0310) BP: 106/63 mmHg (10/08 0310) Pulse Rate: 70 (10/08 0310) Intake/Output from previous day: 10/07 0701 - 10/08 0700 In: 150 [Blood:150] Out: 400 [Urine:400] Intake/Output from this shift: Total I/O In: 150 [Blood:150] Out: 400 [Urine:400]  Labs:  Recent Labs  05/02/15 2240  WBC 6.2  HGB 6.4*  PLT 171  CREATININE 1.89*   Estimated Creatinine Clearance: 34.4 mL/min (by C-G formula based on Cr of 1.89). No results for input(s): VANCOTROUGH, VANCOPEAK, VANCORANDOM, GENTTROUGH, GENTPEAK, GENTRANDOM, TOBRATROUGH, TOBRAPEAK, TOBRARND, AMIKACINPEAK, AMIKACINTROU, AMIKACIN in the last 72 hours.   Microbiology: Recent Results (from the past 720 hour(s))  Urine culture     Status: None   Collection Time: 04/17/15  3:16 PM  Result Value Ref Range Status   Specimen Description URINE, CLEAN CATCH  Final   Special Requests Immunocompromised  Final   Culture   Final    >=100,000 COLONIES/mL STENOTROPHOMONAS MALTOPHILIA 60,000 COLONIES/ml ENTEROCOCCUS SPECIES    Report Status 04/20/2015 FINAL  Final   Organism ID, Bacteria STENOTROPHOMONAS MALTOPHILIA  Final   Organism ID, Bacteria ENTEROCOCCUS SPECIES  Final      Susceptibility   Stenotrophomonas maltophilia - MIC*    LEVOFLOXACIN >=8 RESISTANT Resistant     TRIMETH/SULFA >=320 RESISTANT Resistant     * >=100,000 COLONIES/mL STENOTROPHOMONAS MALTOPHILIA   Enterococcus species - MIC*    AMPICILLIN <=2 SENSITIVE Sensitive     LEVOFLOXACIN >=8 RESISTANT Resistant     NITROFURANTOIN <=16 SENSITIVE Sensitive     VANCOMYCIN 1 SENSITIVE Sensitive     * 60,000 COLONIES/ml ENTEROCOCCUS SPECIES    Medical History: Past Medical History  Diagnosis Date  . Heart disease   . HIV (human immunodeficiency virus infection) ([place])   . Anemia   . Anxiety   . Blood transfusion   . Cataract   . Depression   . GERD (gastroesophageal reflux disease)   . Thyroid disease     low thyroid  . 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 HIV (human immunodeficiency virus infection) ([place]) 04/20/2011  . Dementia 04/20/2011  . CAD (coronary artery disease) 04/20/2011  . Hypothyroidism 04/20/2011  . Dyslipidemia 04/20/2011  . Hypertension   . Dysrhythmia   . Shortness of breath   . Pneumonia   . Hepatitis   . Cancer ([place])     lymphoma  . DLBCL (diffuse large B cell lymphoma) ([place]) 04/20/2011    Medications:  Prescriptions prior to admission  Medication Sig Dispense Refill Last Dose  . apixaban (ELIQUIS) 5 MG TABS tablet Take 5 mg by mouth 2 (two) times daily.   05/02/2015 at 2000  . aspirin 81 MG chewable tablet Chew 81 mg by mouth daily.   05/01/2015 at Unknown time  . DESCOVY 200-25 MG tablet Take 1 tablet by mouth daily.  11 05/02/2015 at Unknown time  . diazepam (VALIUM) 5 MG  tablet Take 5 mg by mouth at bedtime as needed for anxiety.   unknown  . diphenoxylate-atropine (LOMOTIL) 2.5-0.025 MG per tablet Take 2 tablets by mouth 4 (four) times daily.    05/02/2015 at Unknown time  . dolutegravir (TIVICAY) 50 MG tablet Take 50 mg by mouth daily.   05/02/2015 at Unknown time  . gabapentin (NEURONTIN) 300 MG capsule Take 300 mg by mouth 3 (three) times daily.   05/02/2015 at Unknown time  . HYDROmorphone (DILAUDID) 2 MG tablet Take 2 mg by mouth every 4 (four) hours as needed for severe pain.   05/02/2015 at  Unknown time  . levothyroxine (SYNTHROID, LEVOTHROID) 112 MCG tablet Take 112 mcg by mouth daily before breakfast.   05/02/2015 at Unknown time  . lisinopril (PRINIVIL,ZESTRIL) 20 MG tablet Take 1 tablet by mouth daily.  0 05/01/2015 at Unknown time  . loperamide (IMODIUM) 2 MG capsule Take 2 mg by mouth 4 (four) times daily - after meals and at bedtime.    unknown  . metoprolol succinate (TOPROL-XL) 50 MG 24 hr tablet Take 50 mg by mouth daily.   unknown  . nitroGLYCERIN (NITROSTAT) 0.4 MG SL tablet Place 0.4 mg under the tongue every 5 (five) minutes as needed for chest pain.    unknown  . pantoprazole (PROTONIX) 40 MG tablet Take 40 mg by mouth daily.   05/02/2015 at Unknown time  . pravastatin (PRAVACHOL) 40 MG tablet Take 40 mg by mouth daily.   05/02/2015 at Unknown time  . simethicone (MYLICON) 381 MG chewable tablet Chew 125 mg by mouth every 6 (six) hours as needed for flatulence.   unknown  . tamsulosin (FLOMAX) 0.4 MG CAPS capsule Take 0.4 mg by mouth daily.   05/02/2015 at Unknown time  . traMADol (ULTRAM) 50 MG tablet Take 50 mg by mouth every 6 (six) hours as needed for severe pain.    unknown  . valACYclovir (VALTREX) 500 MG tablet Take 500 mg by mouth 2 (two) times daily.   05/02/2015 at Unknown time  . ciprofloxacin (CIPRO) 500 MG tablet Take 1 tablet by mouth 2 (two) times daily. For 5 days 04/18/15-04/23/15  0 Completed Course at Unknown time  . levofloxacin (LEVAQUIN) 750 MG tablet Take 1 tablet (750 mg total) by mouth daily. (Patient not taking: Reported on 05/02/2015) 9 tablet 0 Completed Course at Unknown time  . metoprolol succinate (TOPROL-XL) 25 MG 24 hr tablet Take 12.5 mg by mouth daily.     Not Taking at Unknown time   Scheduled:  . sodium chloride   Intravenous Once  . apixaban  5 mg Oral BID  . dolutegravir  50 mg Oral Daily  . emtricitabine-tenofovir AF  1 tablet Oral Daily  . feeding supplement  1 Container Oral TID BM  . gabapentin  300 mg Oral TID  . levothyroxine   112 mcg Oral QAC breakfast  . metoprolol succinate  50 mg Oral Daily  . piperacillin-tazobactam  3.375 g Intravenous 3 times per day  . pravastatin  40 mg Oral Daily  . sodium chloride  3 mL Intravenous Q12H  . tamsulosin  0.4 mg Oral Daily  . valACYclovir  500 mg Oral BID  . vancomycin  1,000 mg Intravenous Q24H   Infusions:  . sodium chloride     Assessment: 46 yoM with hx of HIV admitted with increased abdominal pain and decreased UO.  Zosyn/Vancomycin per Rx for Sepsis.   Goal of Therapy:  Vancomycin trough level 15-20 mcg/ml  Plan:   Zosyn 3.375 Gm IV q8h EI  Vancomycin 1Gm IV q24h  F/u Scr/cultures/levels as needed  Lawana Pai R 05/03/2015,4:24 AM

## 2015-05-04 DIAGNOSIS — R1084 Generalized abdominal pain: Secondary | ICD-10-CM

## 2015-05-04 DIAGNOSIS — E039 Hypothyroidism, unspecified: Secondary | ICD-10-CM

## 2015-05-04 DIAGNOSIS — Z21 Asymptomatic human immunodeficiency virus [HIV] infection status: Secondary | ICD-10-CM

## 2015-05-04 DIAGNOSIS — N1330 Unspecified hydronephrosis: Secondary | ICD-10-CM

## 2015-05-04 LAB — CBC
HCT: 30.7 % — ABNORMAL LOW (ref 39.0–52.0)
HEMOGLOBIN: 9.7 g/dL — AB (ref 13.0–17.0)
MCH: 27.7 pg (ref 26.0–34.0)
MCHC: 31.6 g/dL (ref 30.0–36.0)
MCV: 87.7 fL (ref 78.0–100.0)
PLATELETS: 134 10*3/uL — AB (ref 150–400)
RBC: 3.5 MIL/uL — AB (ref 4.22–5.81)
RDW: 17.5 % — ABNORMAL HIGH (ref 11.5–15.5)
WBC: 4.5 10*3/uL (ref 4.0–10.5)

## 2015-05-04 LAB — OCCULT BLOOD X 1 CARD TO LAB, STOOL: FECAL OCCULT BLD: NEGATIVE

## 2015-05-04 MED ORDER — HYDROMORPHONE HCL 2 MG PO TABS: 2.0000 mg | ORAL_TABLET | ORAL | Status: DC | PRN

## 2015-05-04 NOTE — Progress Notes (Signed)
Patient stated he does not want endoscopy done any more. Dr. Collene Mares and Dr. Darrick Meigs notified.

## 2015-05-04 NOTE — Progress Notes (Signed)
Discharge teaching given to pt including activity, diet, medications, and follow-up appts. Pt verbalized understanding of all discharge instructions. IV access was d/c'd. Pt  escorted out by RN, driven home by friend.

## 2015-05-04 NOTE — Plan of Care (Signed)
Problem: Phase III Progression Outcomes Goal: Foley discontinued Outcome: Not Met (add Reason) Chronic foley

## 2015-05-04 NOTE — Discharge Summary (Signed)
Physician Discharge Summary  Troy Holden VQQ:[id number] DOB: 13-Mar-1951 DOA: 05/02/2015  PCP: Hayden Rasmussen., MD  Admit date: 05/02/2015 Discharge date: 05/04/2015  Time spent: 25* minutes  Recommendations for Outpatient Follow-up:  1. Follow up PCP in one week 2. Talk to your cardiologist regarding taking Eliquis  Discharge Diagnoses:  Principal Problem:   Symptomatic anemia Active Problems:   DLBCL (diffuse large B cell lymphoma) (HCC)   HIV (human immunodeficiency virus infection) ([place])   Hypothyroidism   Sepsis ([place])   AKI (acute kidney injury) ([place])   Generalized abdominal pain   Urinary tract infection associated with indwelling urethral catheter   Decreased urination   Hydronephrosis, bilateral   Absolute anemia   Discharge Condition: Stable  Diet recommendation: Regular diet  Filed Weights   05/02/15 2145  Weight: 60.782 kg (134 lb)    History of present illness:  64 y.o. male with a history of HIV Disease, Rectal Cancer, DLBCL, CAD, HTN, Hypothyroid who presents to the ED with complaints of increased ABD pain progressing over the past 2 weeks with decreased urine output for the past 2 days. He was evaluated and placed on antibiotic rx for a UTI with Cipro from 09/23 until 09/ 28. He has a chronic foley catheter as well. This evening he was Seen and a Sepsis workup was initiated and he was placed on IV Vancomycin and Zosyn. A CT scan of the ABD was performed and revealed Bilateral Hydronephrosis R>L. A Urology consultation was placed by the EDP. He reports having Black stools off and on. His Hemoglobin was also found to be 6.4 and had been 10.3 on 09/22, and an FOBT was done and was HEME negative. 2 units of PRBCs were ordered for transfusion.   Hospital Course:   Anemia- patient came with hemoglobin of 6.4 x 2 units of blood transfusion. Repeat hemoglobin is 9.1. FOBT was found to be positive. Patient was seen by GI Dr Collene Mares, and was offered  upper GI endoscopy. Patient declined to undergo endoscopy at this time and wants to follow-up with his physicians at [facility]. Will hold liquids at this time along with aspirin which patient has been taking for atrial fibrillation. Patient will call his cardiologist in a.m. to discuss the options of anticoagulation.  Neurogenic bladder-  patient was found to have bilateral hydronephrosis right more than left. He was seen by urology, and no plans for any intervention at this time.   ? UTI- patient has chronic indwelling Foley catheter, urology doesn't and that patient has infection at this time. We will discontinue antibiotic. Patient is afebrile white count is normal vitals stable. Lactic acid 1.0. Urine culture still pending.  Hypothyroidism Continue Synthroid  Generalized abdominal pain due to rectal cancer Will give Dilaudid when necessary for pain.  HIV CD4 count is pending, patient to follow-up with ID at [facility].  Procedures:  None  Consultations:  Urology  GI  Discharge Exam: Filed Vitals:   05/04/15 1027  BP: 126/75  Pulse: 70  Temp:   Resp:     General: Appears in no acute distress Cardiovascular: S1-S2 regular Respiratory: clear bilaterally*  Discharge Instructions   Discharge Instructions    Diet - low sodium heart healthy    Complete by:  As directed      Increase activity slowly    Complete by:  As directed           Current Discharge Medication List    CONTINUE these medications which have  CHANGED   Details  HYDROmorphone (DILAUDID) 2 MG tablet Take 1 tablet (2 mg total) by mouth every 4 (four) hours as needed for severe pain. Qty: 30 tablet, Refills: 0      CONTINUE these medications which have NOT CHANGED   Details  DESCOVY 200-25 MG tablet Take 1 tablet by mouth daily. Refills: 11    diazepam (VALIUM) 5 MG tablet Take 5 mg by mouth at bedtime as needed for anxiety.    diphenoxylate-atropine (LOMOTIL) 2.5-0.025 MG per  tablet Take 2 tablets by mouth 4 (four) times daily.     dolutegravir (TIVICAY) 50 MG tablet Take 50 mg by mouth daily.    gabapentin (NEURONTIN) 300 MG capsule Take 300 mg by mouth 3 (three) times daily.    levothyroxine (SYNTHROID, LEVOTHROID) 112 MCG tablet Take 112 mcg by mouth daily before breakfast.    lisinopril (PRINIVIL,ZESTRIL) 20 MG tablet Take 1 tablet by mouth daily. Refills: 0    loperamide (IMODIUM) 2 MG capsule Take 2 mg by mouth 4 (four) times daily - after meals and at bedtime.     !! metoprolol succinate (TOPROL-XL) 50 MG 24 hr tablet Take 50 mg by mouth daily.    nitroGLYCERIN (NITROSTAT) 0.4 MG SL tablet Place 0.4 mg under the tongue every 5 (five) minutes as needed for chest pain.     pantoprazole (PROTONIX) 40 MG tablet Take 40 mg by mouth daily.    pravastatin (PRAVACHOL) 40 MG tablet Take 40 mg by mouth daily.    simethicone (MYLICON) 557 MG chewable tablet Chew 125 mg by mouth every 6 (six) hours as needed for flatulence.    tamsulosin (FLOMAX) 0.4 MG CAPS capsule Take 0.4 mg by mouth daily.    traMADol (ULTRAM) 50 MG tablet Take 50 mg by mouth every 6 (six) hours as needed for severe pain.     valACYclovir (VALTREX) 500 MG tablet Take 500 mg by mouth 2 (two) times daily.    !! metoprolol succinate (TOPROL-XL) 25 MG 24 hr tablet Take 12.5 mg by mouth daily.       !! - Potential duplicate medications found. Please discuss with provider.    STOP taking these medications     apixaban (ELIQUIS) 5 MG TABS tablet      aspirin 81 MG chewable tablet      ciprofloxacin (CIPRO) 500 MG tablet      levofloxacin (LEVAQUIN) 750 MG tablet        Allergies  Allergen Reactions  . Lidocaine Hcl Rash    Patient had to be rushed to hospital from dentist office after receiving xylocaine  . Trazodone And Nefazodone Other (See Comments)    Nightmares, hangover like feeling for 2-3 days  . Oxycodone Other (See Comments) and Palpitations    Made him off balance  and loopy Dizzy, nausea and tachycardia      The results of significant diagnostics from this hospitalization (including imaging, microbiology, ancillary and laboratory) are listed below for reference.    Significant Diagnostic Studies: Ct Abdomen Pelvis Wo Contrast  05/03/2015   CLINICAL DATA:  Abdominal pain, nausea and decreased urine output.  EXAM: CT ABDOMEN AND PELVIS WITHOUT CONTRAST  TECHNIQUE: Multidetector CT imaging of the abdomen and pelvis was performed following the standard protocol without IV contrast.  COMPARISON:  12/20/2013  FINDINGS: Lower chest:  Hypoventilatory changes, worse in the left lung base.  Hepatobiliary: No masses or other significant abnormality identified. Rim calcified 1.9 cm gallbladder stone is again seen.  Pancreas:  Normal unenhanced appearance.  Spleen: The spleen is enlarged, with peripheral calcification, likely posttraumatic.  Adrenal Glands:  No masses identified.  Kidneys/Urinary Tract: There is bilateral hydronephrosis, worse on the left, and moderate hydroureter. The dilated left ureter could be followed to the level of the vesicoureteral junction. The urinary bladder wall is diffusely thickened, however the urinary bladder is poorly evaluated due to lack of IV contrast and decompressed state, around urinary Foley. The prostate gland is not enlarged but contains coarse calcifications.  Stomach/Bowel/Peritoneum: No evidence of wall thickening, mass, or obstruction. There is a large amount of stool throughout the colon. Enterotomy lines also seen in the right lower abdomen.  Vascular/Lymphatic: No pathologically enlarged lymph nodes identified. There are heavy atherosclerotic calcifications of the aorta.  Other: Electronic apparatus is seen within the soft tissues of the right buttock, with lead terminating in right perirectal location.  Musculoskeletal:  No suspicious bone lesions identified.  IMPRESSION: Severe bilateral hydronephrosis and hydroureter, worse  on the left. No evident reason is seen on this nonenhanced CT. The urinary bladder wall is diffusely thickened, however the urinary bladder is decompressed around a Foley catheter and therefore poorly evaluated. The prostate gland is not particularly enlarged but contains multiple calcifications.  Large amount of stool throughout the colon, consistent with constipation.  1.9 cm rim calcified gallstone.  Heavy atherosclerotic disease of the aorta.  Splenomegaly.   Electronically Signed   By: Fidela Salisbury M.D.   On: 05/03/2015 01:24   US Renal  05/03/2015   CLINICAL DATA:  Bilateral flank pain over the last year with history of hydronephrosis. HIV positive.  EXAM: RENAL / URINARY TRACT ULTRASOUND COMPLETE  COMPARISON:  CT 05/03/2015. CT 12/20/2013. Multiple previous CT and ultrasound studies.  FINDINGS: Right Kidney:  Length: 1218 cm. Diffusely echogenic. Mild to moderate fullness of the right renal pelvis. Calices do not appear dilated. No visible stone.  Left Kidney:  Length: 14 cm in length. Diffusely echogenic. Markedly dilated renal pelvis and proximal ureter. Calices are dilated on this side.  Bladder:  Foley catheter in the bladder.  Thick walled bladder appear  IMPRESSION: Echogenic kidneys consistent with renal parenchymal disease. This can be seen with HIV.  Prominent extra renal pelvis on the right. Similar appearance to the study of 2015.  Worsened obstruction pattern on the left with dilated calices, extrarenal pelvis and ureter. This could be due to relative obstruction at a thick-walled bladder.   Electronically Signed   By: Nelson Chimes M.D.   On: 05/03/2015 09:49   Dg Chest Port 1 View  05/03/2015   CLINICAL DATA:  Acute onset of sepsis. Generalized body aches. Initial encounter.  EXAM: PORTABLE CHEST 1 VIEW  COMPARISON:  Chest radiograph performed 04/13/2013  FINDINGS: The lungs are well-aerated. Vascular congestion is noted. Increased interstitial markings may reflect interstitial edema  or possibly atypical infection. No pleural effusion or pneumothorax is seen.  The cardiomediastinal silhouette is within normal limits. No acute osseous abnormalities are seen.  IMPRESSION: Vascular congestion noted. Increased interstitial markings may reflect interstitial edema or possibly atypical infection.   Electronically Signed   By: Garald Balding M.D.   On: 05/03/2015 04:25    Microbiology: Recent Results (from the past 240 hour(s))  Blood Culture (routine x 2)     Status: None (Preliminary result)   Collection Time: 05/02/15 10:30 PM  Result Value Ref Range Status   Specimen Description BLOOD RIGHT WRIST  Final   Special Requests BOTTLES DRAWN  AEROBIC AND ANAEROBIC 5CC  Final   Culture   Final    NO GROWTH 1 DAY Performed at [facility]    Report Status PENDING  Incomplete  Blood Culture (routine x 2)     Status: None (Preliminary result)   Collection Time: 05/02/15 10:35 PM  Result Value Ref Range Status   Specimen Description BLOOD RIGHT FOREARM  Final   Special Requests BOTTLES DRAWN AEROBIC AND ANAEROBIC 5CC  Final   Culture   Final    NO GROWTH 1 DAY Performed at [facility]    Report Status PENDING  Incomplete     Labs: Basic Metabolic Panel:  Recent Labs Lab 05/02/15 2240 05/03/15 1030  NA 136 137  K 3.8 3.6  CL 106 107  CO2 22 24  GLUCOSE 121* 118*  BUN 28* 24*  CREATININE 1.89* 1.72*  CALCIUM 8.6* 8.1*   Liver Function Tests:  Recent Labs Lab 05/02/15 2240  AST 11*  ALT 9*  ALKPHOS 63  BILITOT 0.4  PROT 7.2  ALBUMIN 3.2*   No results for input(s): LIPASE, AMYLASE in the last 168 hours. No results for input(s): AMMONIA in the last 168 hours. CBC:  Recent Labs Lab 05/02/15 2240 05/03/15 1030 05/04/15 0837  WBC 6.2 5.1 4.5  NEUTROABS 4.3  --   --   HGB 6.4* 9.1* 9.7*  HCT 20.4* 28.6* 30.7*  MCV 87.2 86.7 87.7  PLT 171 137* 134*   CBG: No results for input(s): GLUCAP in the last 168  hours.     SignedEleonore Chiquito S  [facility] 05/04/2015, 11:23 AM

## 2015-05-04 NOTE — Progress Notes (Signed)
Subjective: Did well overnight, pain better controlled, responded to blood, afebrile, making plenty of urine.  Objective: Vital signs in last 24 hours: Temp:  [98 F (36.7 C)-98.5 F (36.9 C)] 98 F (36.7 C) (10/09 1100) Pulse Rate:  [67-75] 67 (10/09 1100) Resp:  [16] 16 (10/09 0530) BP: (98-136)/(61-75) 127/65 mmHg (10/09 1100) SpO2:  [93 %-99 %] 97 % (10/09 1100)  Intake/Output from previous day: 10/08 0701 - 10/09 0700 In: 2175.7 [P.O.:720; I.V.:830.9; Blood:324.8; IV Piggyback:300] Out: 2800 [Urine:2800] Intake/Output this shift: Total I/O In: 120 [P.O.:120] Out: -   Physical Exam:  General: Alert and oriented CV: RRR Lungs: NWOB Abdomen: Soft, ND Ext: NT, No erythema  Lab Results:  Recent Labs  05/02/15 2240 05/03/15 1030 05/04/15 0837  HGB 6.4* 9.1* 9.7*  HCT 20.4* 28.6* 30.7*   BMET  Recent Labs  05/02/15 2240 05/03/15 1030  NA 136 137  K 3.8 3.6  CL 106 107  CO2 22 24  GLUCOSE 121* 118*  BUN 28* 24*  CREATININE 1.89* 1.72*  CALCIUM 8.6* 8.1*     Studies/Results: Ct Abdomen Pelvis Wo Contrast  05/03/2015   CLINICAL DATA:  Abdominal pain, nausea and decreased urine output.  EXAM: CT ABDOMEN AND PELVIS WITHOUT CONTRAST  TECHNIQUE: Multidetector CT imaging of the abdomen and pelvis was performed following the standard protocol without IV contrast.  COMPARISON:  12/20/2013  FINDINGS: Lower chest:  Hypoventilatory changes, worse in the left lung base.  Hepatobiliary: No masses or other significant abnormality identified. Rim calcified 1.9 cm gallbladder stone is again seen.  Pancreas:  Normal unenhanced appearance.  Spleen: The spleen is enlarged, with peripheral calcification, likely posttraumatic.  Adrenal Glands:  No masses identified.  Kidneys/Urinary Tract: There is bilateral hydronephrosis, worse on the left, and moderate hydroureter. The dilated left ureter could be followed to the level of the vesicoureteral junction. The urinary bladder wall  is diffusely thickened, however the urinary bladder is poorly evaluated due to lack of IV contrast and decompressed state, around urinary Foley. The prostate gland is not enlarged but contains coarse calcifications.  Stomach/Bowel/Peritoneum: No evidence of wall thickening, mass, or obstruction. There is a large amount of stool throughout the colon. Enterotomy lines also seen in the right lower abdomen.  Vascular/Lymphatic: No pathologically enlarged lymph nodes identified. There are heavy atherosclerotic calcifications of the aorta.  Other: Electronic apparatus is seen within the soft tissues of the right buttock, with lead terminating in right perirectal location.  Musculoskeletal:  No suspicious bone lesions identified.  IMPRESSION: Severe bilateral hydronephrosis and hydroureter, worse on the left. No evident reason is seen on this nonenhanced CT. The urinary bladder wall is diffusely thickened, however the urinary bladder is decompressed around a Foley catheter and therefore poorly evaluated. The prostate gland is not particularly enlarged but contains multiple calcifications.  Large amount of stool throughout the colon, consistent with constipation.  1.9 cm rim calcified gallstone.  Heavy atherosclerotic disease of the aorta.  Splenomegaly.   Electronically Signed   By: Fidela Salisbury M.D.   On: 05/03/2015 01:24   US Renal  05/03/2015   CLINICAL DATA:  Bilateral flank pain over the last year with history of hydronephrosis. HIV positive.  EXAM: RENAL / URINARY TRACT ULTRASOUND COMPLETE  COMPARISON:  CT 05/03/2015. CT 12/20/2013. Multiple previous CT and ultrasound studies.  FINDINGS: Right Kidney:  Length: 1218 cm. Diffusely echogenic. Mild to moderate fullness of the right renal pelvis. Calices do not appear dilated. No visible stone.  Left Kidney:  Length: 14 cm in length. Diffusely echogenic. Markedly dilated renal pelvis and proximal ureter. Calices are dilated on this side.  Bladder:  Foley  catheter in the bladder.  Thick walled bladder appear  IMPRESSION: Echogenic kidneys consistent with renal parenchymal disease. This can be seen with HIV.  Prominent extra renal pelvis on the right. Similar appearance to the study of 2015.  Worsened obstruction pattern on the left with dilated calices, extrarenal pelvis and ureter. This could be due to relative obstruction at a thick-walled bladder.   Electronically Signed   By: Nelson Chimes M.D.   On: 05/03/2015 09:49   Dg Chest Port 1 View  05/03/2015   CLINICAL DATA:  Acute onset of sepsis. Generalized body aches. Initial encounter.  EXAM: PORTABLE CHEST 1 VIEW  COMPARISON:  Chest radiograph performed 04/13/2013  FINDINGS: The lungs are well-aerated. Vascular congestion is noted. Increased interstitial markings may reflect interstitial edema or possibly atypical infection. No pleural effusion or pneumothorax is seen.  The cardiomediastinal silhouette is within normal limits. No acute osseous abnormalities are seen.  IMPRESSION: Vascular congestion noted. Increased interstitial markings may reflect interstitial edema or possibly atypical infection.   Electronically Signed   By: Garald Balding M.D.   On: 05/03/2015 04:25    Assessment/Plan: 64 y.o. male with complex history including rectal cancer that led to a proctectomy that led to neurogenic bladder managed with chronic indwelling foley which led to trouble with resistant infections. The patient is followed mostly at [facility] by oncology, GI surgery, urology and infectious disease.  - Doing well, Renal function better than recent baseline. - No need for antibiotics at this time, patient will return for fevers >101.5 - The patient may follow up with [facility] or [facility] for foley care.    LOS: 1 day   Christell Faith 05/04/2015, 12:36 PM

## 2015-05-05 LAB — TYPE AND SCREEN
ABO/RH(D): B NEG
Antibody Screen: NEGATIVE
UNIT DIVISION: 0
UNIT DIVISION: 0

## 2015-05-05 LAB — T-HELPER CELLS (CD4) COUNT (NOT AT ARMC)
CD4 % Helper T Cell: 21 % — ABNORMAL LOW (ref 33–55)
CD4 T Cell Abs: 170 /uL — ABNORMAL LOW (ref 400–2700)

## 2015-05-05 LAB — URINE CULTURE

## 2015-05-08 LAB — CULTURE, BLOOD (ROUTINE X 2)
CULTURE: NO GROWTH
CULTURE: NO GROWTH

## 2015-08-16 ENCOUNTER — Encounter (HOSPITAL_COMMUNITY): Payer: Self-pay | Admitting: Emergency Medicine

## 2015-08-16 ENCOUNTER — Inpatient Hospital Stay (HOSPITAL_COMMUNITY)
Admission: EM | Admit: 2015-08-16 | Discharge: 2015-08-19 | DRG: 698 | Disposition: A | Discharge status: Hospice | Attending: Internal Medicine | Admitting: Internal Medicine

## 2015-08-16 DIAGNOSIS — R310 Gross hematuria: Secondary | ICD-10-CM | POA: Diagnosis not present

## 2015-08-16 DIAGNOSIS — Z9221 Personal history of antineoplastic chemotherapy: Secondary | ICD-10-CM

## 2015-08-16 DIAGNOSIS — Z515 Encounter for palliative care: Secondary | ICD-10-CM | POA: Insufficient documentation

## 2015-08-16 DIAGNOSIS — Z79899 Other long term (current) drug therapy: Secondary | ICD-10-CM

## 2015-08-16 DIAGNOSIS — D649 Anemia, unspecified: Secondary | ICD-10-CM | POA: Diagnosis present

## 2015-08-16 DIAGNOSIS — E785 Hyperlipidemia, unspecified: Secondary | ICD-10-CM | POA: Diagnosis present

## 2015-08-16 DIAGNOSIS — M4628 Osteomyelitis of vertebra, sacral and sacrococcygeal region: Secondary | ICD-10-CM | POA: Diagnosis present

## 2015-08-16 DIAGNOSIS — Z800 Family history of malignant neoplasm of digestive organs: Secondary | ICD-10-CM

## 2015-08-16 DIAGNOSIS — I2510 Atherosclerotic heart disease of native coronary artery without angina pectoris: Secondary | ICD-10-CM | POA: Diagnosis present

## 2015-08-16 DIAGNOSIS — Z66 Do not resuscitate: Secondary | ICD-10-CM | POA: Diagnosis present

## 2015-08-16 DIAGNOSIS — N183 Chronic kidney disease, stage 3 (moderate): Secondary | ICD-10-CM | POA: Diagnosis present

## 2015-08-16 DIAGNOSIS — E872 Acidosis: Secondary | ICD-10-CM | POA: Diagnosis present

## 2015-08-16 DIAGNOSIS — D696 Thrombocytopenia, unspecified: Secondary | ICD-10-CM | POA: Diagnosis present

## 2015-08-16 DIAGNOSIS — F419 Anxiety disorder, unspecified: Secondary | ICD-10-CM | POA: Diagnosis present

## 2015-08-16 DIAGNOSIS — B20 Human immunodeficiency virus [HIV] disease: Secondary | ICD-10-CM | POA: Diagnosis present

## 2015-08-16 DIAGNOSIS — Z888 Allergy status to other drugs, medicaments and biological substances status: Secondary | ICD-10-CM

## 2015-08-16 DIAGNOSIS — L89159 Pressure ulcer of sacral region, unspecified stage: Secondary | ICD-10-CM | POA: Diagnosis present

## 2015-08-16 DIAGNOSIS — Z85048 Personal history of other malignant neoplasm of rectum, rectosigmoid junction, and anus: Secondary | ICD-10-CM

## 2015-08-16 DIAGNOSIS — I130 Hypertensive heart and chronic kidney disease with heart failure and stage 1 through stage 4 chronic kidney disease, or unspecified chronic kidney disease: Secondary | ICD-10-CM | POA: Diagnosis present

## 2015-08-16 DIAGNOSIS — Z8572 Personal history of non-Hodgkin lymphomas: Secondary | ICD-10-CM

## 2015-08-16 DIAGNOSIS — Y846 Urinary catheterization as the cause of abnormal reaction of the patient, or of later complication, without mention of misadventure at the time of the procedure: Secondary | ICD-10-CM | POA: Diagnosis present

## 2015-08-16 DIAGNOSIS — N390 Urinary tract infection, site not specified: Secondary | ICD-10-CM | POA: Diagnosis present

## 2015-08-16 DIAGNOSIS — Z21 Asymptomatic human immunodeficiency virus [HIV] infection status: Secondary | ICD-10-CM | POA: Diagnosis present

## 2015-08-16 DIAGNOSIS — F17210 Nicotine dependence, cigarettes, uncomplicated: Secondary | ICD-10-CM | POA: Diagnosis present

## 2015-08-16 DIAGNOSIS — T83511A Infection and inflammatory reaction due to indwelling urethral catheter, initial encounter: Secondary | ICD-10-CM | POA: Diagnosis not present

## 2015-08-16 DIAGNOSIS — C20 Malignant neoplasm of rectum: Secondary | ICD-10-CM | POA: Diagnosis present

## 2015-08-16 DIAGNOSIS — F329 Major depressive disorder, single episode, unspecified: Secondary | ICD-10-CM | POA: Diagnosis present

## 2015-08-16 DIAGNOSIS — K219 Gastro-esophageal reflux disease without esophagitis: Secondary | ICD-10-CM | POA: Diagnosis present

## 2015-08-16 DIAGNOSIS — Z885 Allergy status to narcotic agent status: Secondary | ICD-10-CM

## 2015-08-16 DIAGNOSIS — T83518D Infection and inflammatory reaction due to other urinary catheter, subsequent encounter: Secondary | ICD-10-CM

## 2015-08-16 DIAGNOSIS — F0390 Unspecified dementia without behavioral disturbance: Secondary | ICD-10-CM | POA: Diagnosis present

## 2015-08-16 DIAGNOSIS — E039 Hypothyroidism, unspecified: Secondary | ICD-10-CM | POA: Diagnosis present

## 2015-08-16 DIAGNOSIS — N179 Acute kidney failure, unspecified: Secondary | ICD-10-CM | POA: Diagnosis present

## 2015-08-16 NOTE — ED Provider Notes (Signed)
CSN: [id number]     Arrival date & time 08/16/15  2323 History   By signing my name below, I, Troy Holden, attest that this documentation has been prepared under the direction and in the presence of Lacretia Leigh, MD.  Electronically Signed: Forrestine Holden, ED Scribe. 08/16/2015. 11:40 PM.   Chief Complaint  Patient presents with  . Hematuria   The history is provided by the patient. No language interpreter was used.    HPI Comments: Troy Holden is a 65 y.o. male with a PMHx of HIV, CAD, HTN, and lymphoma who presents to the Emergency Department complaining of constant, ongoing hematuria onset 2:00 PM this afternoon. Pt states he noted bleeding in his catheter bag today as he has a chronic foley catheter in place. He also reports "feeling flushed" this evening. No interventions given en route to department. No recent nausea or vomiting. Troy Holden admits to a prior history of same in October 6 of last year as a result of "internal bleeding". No prior history of kidney stones.   PCP: Troy Holden., MD    Past Medical History  Diagnosis Date  . Heart disease   . HIV (human immunodeficiency virus infection) ([place])   . Anemia   . Anxiety   . Blood transfusion   . Cataract   . Depression   . GERD (gastroesophageal reflux disease)   . Thyroid disease     low thyroid  . 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 HIV (human immunodeficiency virus infection) ([place]) 04/20/2011  . Dementia 04/20/2011  . CAD (coronary artery disease) 04/20/2011  . Hypothyroidism 04/20/2011  . Dyslipidemia 04/20/2011  . Hypertension   . Dysrhythmia   . Shortness of breath   . Pneumonia   . Hepatitis   . Cancer ([place])     lymphoma  . DLBCL (diffuse large B cell lymphoma) ([place]) 04/20/2011   Past Surgical History  Procedure Laterality Date  . Cardiac surgery    . Abdominal surgery    . Colonoscopy    . Tumor removal      intestine  . Muscle stimulator       In lower back   Family History   Problem Relation Age of Onset  . Colon cancer Mother   . Colon cancer Brother    Social History  Substance Use Topics  . Smoking status: Current Some Day Smoker -- 0.25 packs/day for 10 years    Types: Cigarettes    Last Attempt to Quit: 03/30/2011  . Smokeless tobacco: None  . Alcohol Use: No    Review of Systems  Gastrointestinal: Negative for nausea and vomiting.  Genitourinary: Positive for hematuria.  Psychiatric/Behavioral: Negative for confusion.  All other systems reviewed and are negative.     Allergies  Lidocaine hcl; Trazodone and nefazodone; and Oxycodone  Home Medications   Prior to Admission medications   Medication Sig Start Date End Date Taking? Authorizing Provider  DESCOVY 200-25 MG tablet Take 1 tablet by mouth daily. 04/18/15   Historical Provider, MD  diazepam (VALIUM) 5 MG tablet Take 5 mg by mouth at bedtime as needed for anxiety.    Historical Provider, MD  diphenoxylate-atropine (LOMOTIL) 2.5-0.025 MG per tablet Take 2 tablets by mouth 4 (four) times daily.     Historical Provider, MD  dolutegravir (TIVICAY) 50 MG tablet Take 50 mg by mouth daily.    Historical Provider, MD  gabapentin (NEURONTIN) 300 MG capsule Take 300 mg by mouth 3 (three) times daily.  Historical Provider, MD  HYDROmorphone (DILAUDID) 2 MG tablet Take 1 tablet (2 mg total) by mouth every 4 (four) hours as needed for severe pain. 05/04/15   Oswald Hillock, MD  levothyroxine (SYNTHROID, LEVOTHROID) 112 MCG tablet Take 112 mcg by mouth daily before breakfast.    Historical Provider, MD  lisinopril (PRINIVIL,ZESTRIL) 20 MG tablet Take 1 tablet by mouth daily. 03/14/15   Historical Provider, MD  loperamide (IMODIUM) 2 MG capsule Take 2 mg by mouth 4 (four) times daily - after meals and at bedtime.     Historical Provider, MD  metoprolol succinate (TOPROL-XL) 25 MG 24 hr tablet Take 12.5 mg by mouth daily.      Historical Provider, MD  metoprolol succinate (TOPROL-XL) 50 MG 24 hr tablet  Take 50 mg by mouth daily. 04/14/15   Historical Provider, MD  nitroGLYCERIN (NITROSTAT) 0.4 MG SL tablet Place 0.4 mg under the tongue every 5 (five) minutes as needed for chest pain.     Historical Provider, MD  pantoprazole (PROTONIX) 40 MG tablet Take 40 mg by mouth daily.    Historical Provider, MD  pravastatin (PRAVACHOL) 40 MG tablet Take 40 mg by mouth daily.    Historical Provider, MD  simethicone (MYLICON) 0000000 MG chewable tablet Chew 125 mg by mouth every 6 (six) hours as needed for flatulence.    Historical Provider, MD  tamsulosin (FLOMAX) 0.4 MG CAPS capsule Take 0.4 mg by mouth daily.    Historical Provider, MD  traMADol (ULTRAM) 50 MG tablet Take 50 mg by mouth every 6 (six) hours as needed for severe pain.     Historical Provider, MD  valACYclovir (VALTREX) 500 MG tablet Take 500 mg by mouth 2 (two) times daily.    Historical Provider, MD   Triage Vitals: BP 108/65 mmHg  Pulse 84  Temp(Src) 98.8 F (37.1 C) (Oral)  Resp 16  SpO2 98%   Physical Exam  Constitutional: He is oriented to person, place, and time. He appears well-developed and well-nourished.  Non-toxic appearance. No distress.  HENT:  Head: Normocephalic and atraumatic.  Eyes: Conjunctivae, EOM and lids are normal. Pupils are equal, round, and reactive to light.  Neck: Normal range of motion. Neck supple. No tracheal deviation present. No thyroid mass present.  Cardiovascular: Normal rate, regular rhythm and normal heart sounds.  Exam reveals no gallop.   No murmur heard. Pulmonary/Chest: Effort normal and breath sounds normal. No stridor. No respiratory distress. He has no decreased breath sounds. He has no wheezes. He has no rhonchi. He has no rales.  Abdominal: Soft. Normal appearance and bowel sounds are normal. He exhibits no distension. There is no tenderness. There is no rebound and no CVA tenderness.  Genitourinary:  No CVA tenderness  Dark urine without any signs of blood clots  Musculoskeletal: Normal  range of motion. He exhibits no edema or tenderness.  Neurological: He is alert and oriented to person, place, and time. He has normal strength. No cranial nerve deficit or sensory deficit. GCS eye subscore is 4. GCS verbal subscore is 5. GCS motor subscore is 6.  Skin: Skin is warm and dry. No abrasion and no rash noted.  Psychiatric: He has a normal mood and affect. His speech is normal and behavior is normal.  Nursing note and vitals reviewed.   ED Course  Procedures (including critical care time)  DIAGNOSTIC STUDIES: Oxygen Saturation is 98% on RA, Normal by my interpretation.    COORDINATION OF CARE: 11:39 PM- Will order  urinalysis, CBC, and BMP. Discussed treatment plan with pt at bedside and pt agreed to plan.     Labs Review Labs Reviewed  URINALYSIS, ROUTINE W REFLEX MICROSCOPIC (NOT AT [facility]) - Abnormal; Notable for the following:    Color, Urine BROWN (*)    APPearance TURBID (*)    Glucose, UA 100 (*)    Hgb urine dipstick LARGE (*)    Protein, ur 100 (*)    Nitrite POSITIVE (*)    Leukocytes, UA LARGE (*)    All other components within normal limits  CBC WITH DIFFERENTIAL/PLATELET - Abnormal; Notable for the following:    RBC 2.76 (*)    Hemoglobin 7.6 (*)    HCT 24.9 (*)    RDW 17.5 (*)    Platelets 125 (*)    All other components within normal limits  URINE MICROSCOPIC-ADD ON - Abnormal; Notable for the following:    Bacteria, UA MANY (*)    All other components within normal limits  URINE CULTURE  BASIC METABOLIC PANEL    Imaging Review No results found. I have personally reviewed and evaluated these images and lab results as part of my medical decision-making.   EKG Interpretation None      MDM   Final diagnoses:  None   I personally performed the services described in this documentation, which was scribed in my presence. The recorded information has been reviewed and is accurate.   Patient with signs of acute kidney injury on his blood work as  well as decrease in hemoglobin likely from a urological source. He has been type and screen and will be admitted to the hospitalist service   Lacretia Leigh, MD 08/17/15 [phone]

## 2015-08-16 NOTE — ED Notes (Signed)
Pt BIB EMS c/o blood in his catheter bag that he noticed around 1400. Pt states it started as pink and has gradually become bright red in color. Pt is a hospice pt. Pt denies pain.

## 2015-08-17 ENCOUNTER — Encounter (HOSPITAL_COMMUNITY): Payer: Self-pay | Admitting: Family Medicine

## 2015-08-17 ENCOUNTER — Emergency Department (HOSPITAL_COMMUNITY)

## 2015-08-17 DIAGNOSIS — Z66 Do not resuscitate: Secondary | ICD-10-CM | POA: Diagnosis present

## 2015-08-17 DIAGNOSIS — Z21 Asymptomatic human immunodeficiency virus [HIV] infection status: Secondary | ICD-10-CM | POA: Diagnosis not present

## 2015-08-17 DIAGNOSIS — F329 Major depressive disorder, single episode, unspecified: Secondary | ICD-10-CM | POA: Diagnosis present

## 2015-08-17 DIAGNOSIS — M4628 Osteomyelitis of vertebra, sacral and sacrococcygeal region: Secondary | ICD-10-CM | POA: Diagnosis present

## 2015-08-17 DIAGNOSIS — E785 Hyperlipidemia, unspecified: Secondary | ICD-10-CM | POA: Diagnosis present

## 2015-08-17 DIAGNOSIS — T83511A Infection and inflammatory reaction due to indwelling urethral catheter, initial encounter: Secondary | ICD-10-CM | POA: Diagnosis present

## 2015-08-17 DIAGNOSIS — N179 Acute kidney failure, unspecified: Secondary | ICD-10-CM

## 2015-08-17 DIAGNOSIS — E039 Hypothyroidism, unspecified: Secondary | ICD-10-CM | POA: Diagnosis present

## 2015-08-17 DIAGNOSIS — Z79899 Other long term (current) drug therapy: Secondary | ICD-10-CM | POA: Diagnosis not present

## 2015-08-17 DIAGNOSIS — R319 Hematuria, unspecified: Secondary | ICD-10-CM

## 2015-08-17 DIAGNOSIS — Z8572 Personal history of non-Hodgkin lymphomas: Secondary | ICD-10-CM | POA: Diagnosis not present

## 2015-08-17 DIAGNOSIS — Z85048 Personal history of other malignant neoplasm of rectum, rectosigmoid junction, and anus: Secondary | ICD-10-CM | POA: Diagnosis not present

## 2015-08-17 DIAGNOSIS — Z800 Family history of malignant neoplasm of digestive organs: Secondary | ICD-10-CM | POA: Diagnosis not present

## 2015-08-17 DIAGNOSIS — C20 Malignant neoplasm of rectum: Secondary | ICD-10-CM | POA: Diagnosis present

## 2015-08-17 DIAGNOSIS — T83518D Infection and inflammatory reaction due to other urinary catheter, subsequent encounter: Secondary | ICD-10-CM | POA: Diagnosis not present

## 2015-08-17 DIAGNOSIS — B20 Human immunodeficiency virus [HIV] disease: Secondary | ICD-10-CM | POA: Diagnosis present

## 2015-08-17 DIAGNOSIS — Z885 Allergy status to narcotic agent status: Secondary | ICD-10-CM | POA: Diagnosis not present

## 2015-08-17 DIAGNOSIS — N183 Chronic kidney disease, stage 3 (moderate): Secondary | ICD-10-CM | POA: Diagnosis present

## 2015-08-17 DIAGNOSIS — L89159 Pressure ulcer of sacral region, unspecified stage: Secondary | ICD-10-CM | POA: Diagnosis present

## 2015-08-17 DIAGNOSIS — K219 Gastro-esophageal reflux disease without esophagitis: Secondary | ICD-10-CM | POA: Diagnosis present

## 2015-08-17 DIAGNOSIS — E872 Acidosis: Secondary | ICD-10-CM | POA: Diagnosis present

## 2015-08-17 DIAGNOSIS — F17210 Nicotine dependence, cigarettes, uncomplicated: Secondary | ICD-10-CM | POA: Diagnosis present

## 2015-08-17 DIAGNOSIS — R310 Gross hematuria: Secondary | ICD-10-CM | POA: Diagnosis present

## 2015-08-17 DIAGNOSIS — T83511D Infection and inflammatory reaction due to indwelling urethral catheter, subsequent encounter: Secondary | ICD-10-CM | POA: Diagnosis not present

## 2015-08-17 DIAGNOSIS — I2510 Atherosclerotic heart disease of native coronary artery without angina pectoris: Secondary | ICD-10-CM | POA: Diagnosis present

## 2015-08-17 DIAGNOSIS — F0390 Unspecified dementia without behavioral disturbance: Secondary | ICD-10-CM | POA: Diagnosis present

## 2015-08-17 DIAGNOSIS — Z515 Encounter for palliative care: Secondary | ICD-10-CM | POA: Insufficient documentation

## 2015-08-17 DIAGNOSIS — I130 Hypertensive heart and chronic kidney disease with heart failure and stage 1 through stage 4 chronic kidney disease, or unspecified chronic kidney disease: Secondary | ICD-10-CM | POA: Diagnosis present

## 2015-08-17 DIAGNOSIS — R1084 Generalized abdominal pain: Secondary | ICD-10-CM

## 2015-08-17 DIAGNOSIS — N390 Urinary tract infection, site not specified: Secondary | ICD-10-CM

## 2015-08-17 DIAGNOSIS — Z9221 Personal history of antineoplastic chemotherapy: Secondary | ICD-10-CM | POA: Diagnosis not present

## 2015-08-17 DIAGNOSIS — Y846 Urinary catheterization as the cause of abnormal reaction of the patient, or of later complication, without mention of misadventure at the time of the procedure: Secondary | ICD-10-CM | POA: Diagnosis present

## 2015-08-17 DIAGNOSIS — Z888 Allergy status to other drugs, medicaments and biological substances status: Secondary | ICD-10-CM | POA: Diagnosis not present

## 2015-08-17 DIAGNOSIS — D696 Thrombocytopenia, unspecified: Secondary | ICD-10-CM | POA: Diagnosis present

## 2015-08-17 DIAGNOSIS — D649 Anemia, unspecified: Secondary | ICD-10-CM | POA: Diagnosis not present

## 2015-08-17 DIAGNOSIS — F419 Anxiety disorder, unspecified: Secondary | ICD-10-CM | POA: Diagnosis present

## 2015-08-17 LAB — BASIC METABOLIC PANEL
ANION GAP: 11 (ref 5–15)
BUN: 26 mg/dL — ABNORMAL HIGH (ref 6–20)
CALCIUM: 8 mg/dL — AB (ref 8.9–10.3)
CHLORIDE: 106 mmol/L (ref 101–111)
CO2: 21 mmol/L — AB (ref 22–32)
Creatinine, Ser: 2.11 mg/dL — ABNORMAL HIGH (ref 0.61–1.24)
GFR calc non Af Amer: 32 mL/min — ABNORMAL LOW (ref >60)
GFR, EST AFRICAN AMERICAN: 37 mL/min — AB (ref >60)
GLUCOSE: 125 mg/dL — AB (ref 65–99)
Potassium: 3.7 mmol/L (ref 3.5–5.1)
Sodium: 138 mmol/L (ref 135–145)

## 2015-08-17 LAB — CBC WITH DIFFERENTIAL/PLATELET
BASOS ABS: 0 10*3/uL (ref 0.0–0.1)
BASOS PCT: 0 %
Eosinophils Absolute: 0.1 10*3/uL (ref 0.0–0.7)
Eosinophils Relative: 3 %
HEMATOCRIT: 24.9 % — AB (ref 39.0–52.0)
HEMOGLOBIN: 7.6 g/dL — AB (ref 13.0–17.0)
LYMPHS PCT: 16 %
Lymphs Abs: 0.8 10*3/uL (ref 0.7–4.0)
MCH: 27.5 pg (ref 26.0–34.0)
MCHC: 30.5 g/dL (ref 30.0–36.0)
MCV: 90.2 fL (ref 78.0–100.0)
MONO ABS: 0.4 10*3/uL (ref 0.1–1.0)
MONOS PCT: 8 %
NEUTROS ABS: 3.4 10*3/uL (ref 1.7–7.7)
NEUTROS PCT: 73 %
Platelets: 125 10*3/uL — ABNORMAL LOW (ref 150–400)
RBC: 2.76 MIL/uL — ABNORMAL LOW (ref 4.22–5.81)
RDW: 17.5 % — AB (ref 11.5–15.5)
WBC: 4.7 10*3/uL (ref 4.0–10.5)

## 2015-08-17 LAB — PROTIME-INR
INR: 1.2 (ref 0.00–1.49)
PROTHROMBIN TIME: 15.3 s — AB (ref 11.6–15.2)

## 2015-08-17 LAB — BRAIN NATRIURETIC PEPTIDE: B Natriuretic Peptide: 455.9 pg/mL — ABNORMAL HIGH (ref 0.0–100.0)

## 2015-08-17 LAB — URINALYSIS, ROUTINE W REFLEX MICROSCOPIC
Bilirubin Urine: NEGATIVE
GLUCOSE, UA: 100 mg/dL — AB
Ketones, ur: NEGATIVE mg/dL
NITRITE: POSITIVE — AB
PH: 6.5 (ref 5.0–8.0)
PROTEIN: 100 mg/dL — AB
Specific Gravity, Urine: 1.017 (ref 1.005–1.030)

## 2015-08-17 LAB — GLUCOSE, CAPILLARY: GLUCOSE-CAPILLARY: 93 mg/dL (ref 65–99)

## 2015-08-17 LAB — PREPARE RBC (CROSSMATCH)

## 2015-08-17 LAB — APTT: APTT: 35 s (ref 24–37)

## 2015-08-17 LAB — URINE MICROSCOPIC-ADD ON: Squamous Epithelial / LPF: NONE SEEN

## 2015-08-17 MED ORDER — METOPROLOL SUCCINATE ER 50 MG PO TB24
50.0000 mg | ORAL_TABLET | ORAL | Status: DC
  Administered 2015-08-17 – 2015-08-19 (×2): 50 mg via ORAL
  Filled 2015-08-17 (×2): qty 1

## 2015-08-17 MED ORDER — NITROGLYCERIN 0.4 MG SL SUBL: 0.4000 mg | SUBLINGUAL_TABLET | SUBLINGUAL | Status: DC | PRN

## 2015-08-17 MED ORDER — LOPERAMIDE HCL 2 MG PO CAPS
2.0000 mg | ORAL_CAPSULE | Freq: Three times a day (TID) | ORAL | Status: DC
  Administered 2015-08-17 – 2015-08-19 (×9): 2 mg via ORAL
  Filled 2015-08-17 (×10): qty 1

## 2015-08-17 MED ORDER — DIPHENOXYLATE-ATROPINE 2.5-0.025 MG PO TABS
2.0000 | ORAL_TABLET | Freq: Four times a day (QID) | ORAL | Status: DC
  Administered 2015-08-17 – 2015-08-18 (×7): 2 via ORAL
  Filled 2015-08-17 (×6): qty 2

## 2015-08-17 MED ORDER — SODIUM CHLORIDE 0.9 % IV SOLN
INTRAVENOUS | Status: DC
  Administered 2015-08-17: 01:00:00 via INTRAVENOUS

## 2015-08-17 MED ORDER — PANTOPRAZOLE SODIUM 40 MG PO TBEC
40.0000 mg | DELAYED_RELEASE_TABLET | Freq: Every day | ORAL | Status: DC
  Administered 2015-08-17 – 2015-08-19 (×3): 40 mg via ORAL
  Filled 2015-08-17 (×3): qty 1

## 2015-08-17 MED ORDER — ONDANSETRON HCL 4 MG/2ML IJ SOLN: 4.0000 mg | Freq: Four times a day (QID) | INTRAMUSCULAR | Status: DC | PRN

## 2015-08-17 MED ORDER — ACETAMINOPHEN 650 MG RE SUPP: 650.0000 mg | Freq: Four times a day (QID) | RECTAL | Status: DC | PRN

## 2015-08-17 MED ORDER — GABAPENTIN 300 MG PO CAPS
300.0000 mg | ORAL_CAPSULE | Freq: Three times a day (TID) | ORAL | Status: DC
  Administered 2015-08-17 – 2015-08-19 (×7): 300 mg via ORAL
  Filled 2015-08-17 (×7): qty 1

## 2015-08-17 MED ORDER — EMTRICITABINE-TENOFOVIR AF 200-25 MG PO TABS
1.0000 | ORAL_TABLET | Freq: Every day | ORAL | Status: DC
  Administered 2015-08-17 – 2015-08-19 (×3): 1 via ORAL
  Filled 2015-08-17 (×3): qty 1

## 2015-08-17 MED ORDER — LEVOTHYROXINE SODIUM 112 MCG PO TABS
112.0000 ug | ORAL_TABLET | Freq: Every day | ORAL | Status: DC
Start: 2015-08-17 — End: 2015-08-19
  Administered 2015-08-17 – 2015-08-19 (×3): 112 ug via ORAL
  Filled 2015-08-17 (×4): qty 1

## 2015-08-17 MED ORDER — HYDROMORPHONE HCL 4 MG PO TABS
4.0000 mg | ORAL_TABLET | ORAL | Status: DC | PRN
  Administered 2015-08-17 – 2015-08-19 (×6): 4 mg via ORAL
  Filled 2015-08-17 (×6): qty 1

## 2015-08-17 MED ORDER — ACETAMINOPHEN 325 MG PO TABS: 650.0000 mg | ORAL_TABLET | Freq: Four times a day (QID) | ORAL | Status: DC | PRN

## 2015-08-17 MED ORDER — HYDROMORPHONE HCL 1 MG/ML IJ SOLN
1.0000 mg | Freq: Once | INTRAMUSCULAR | Status: DC
  Filled 2015-08-17: qty 1

## 2015-08-17 MED ORDER — ONDANSETRON HCL 4 MG PO TABS: 4.0000 mg | ORAL_TABLET | Freq: Four times a day (QID) | ORAL | Status: DC | PRN

## 2015-08-17 MED ORDER — DIAZEPAM 5 MG PO TABS: 5.0000 mg | ORAL_TABLET | Freq: Every evening | ORAL | Status: DC | PRN

## 2015-08-17 MED ORDER — ENSURE ENLIVE PO LIQD
237.0000 mL | Freq: Two times a day (BID) | ORAL | Status: DC
  Administered 2015-08-17 (×2): 237 mL via ORAL

## 2015-08-17 MED ORDER — DEXTROSE 5 % IV SOLN
1.0000 g | INTRAVENOUS | Status: DC
  Administered 2015-08-17 – 2015-08-18 (×2): 1 g via INTRAVENOUS
  Filled 2015-08-17 (×3): qty 10

## 2015-08-17 MED ORDER — POTASSIUM CHLORIDE IN NACL 20-0.9 MEQ/L-% IV SOLN
INTRAVENOUS | Status: AC
  Administered 2015-08-17: 07:00:00 via INTRAVENOUS
  Filled 2015-08-17 (×2): qty 1000

## 2015-08-17 MED ORDER — VALACYCLOVIR HCL 500 MG PO TABS
500.0000 mg | ORAL_TABLET | Freq: Every day | ORAL | Status: DC
  Administered 2015-08-17 – 2015-08-19 (×3): 500 mg via ORAL
  Filled 2015-08-17 (×3): qty 1

## 2015-08-17 MED ORDER — DOLUTEGRAVIR SODIUM 50 MG PO TABS
50.0000 mg | ORAL_TABLET | Freq: Every day | ORAL | Status: DC
  Administered 2015-08-17 – 2015-08-19 (×3): 50 mg via ORAL
  Filled 2015-08-17 (×3): qty 1

## 2015-08-17 MED ORDER — FENTANYL 100 MCG/HR TD PT72: 100.0000 ug | MEDICATED_PATCH | TRANSDERMAL | Status: DC

## 2015-08-17 MED ORDER — SODIUM CHLORIDE 0.9 % IV SOLN
Freq: Once | INTRAVENOUS | Status: AC
  Administered 2015-08-17: 03:00:00 via INTRAVENOUS

## 2015-08-17 MED ORDER — FENTANYL 100 MCG/HR TD PT72
100.0000 ug | MEDICATED_PATCH | TRANSDERMAL | Status: DC
  Filled 2015-08-17: qty 1

## 2015-08-17 NOTE — ED Notes (Signed)
Pt returned from CT °

## 2015-08-17 NOTE — Progress Notes (Signed)
Utilization review completed.  

## 2015-08-17 NOTE — Consult Note (Signed)
Consultation Note Date: 08/17/2015   Patient Name: Troy Holden  DOB: 1950/09/12  MRN: [id number]  Age / Sex: 65 y.o., male  PCP: Hayden Rasmussen, MD Referring Physician: Charlynne Cousins, MD  Reason for Consultation: Establishing goals of care    Clinical Assessment/Narrative: Troy Holden is an 65 y.o. 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 He ultimately elected to transition to palliative care at home for pain control, he  also had several episodes of acute renal failure with the last 07/31/2013 with his creatinine going as high as 5.8 which improve with IV fluid, he subsequently developed sepsis and bacteremia with pan sensitive Escherichia coli most likely due to urinary source at that time nephrostomy tubes had to be placed which are now removed.  In  August 2016 Had New Onset Aflutter with Worsening Diarrhea and Hypovolemic Shock He Was Treated Aggressively with Volume and Conservative Management He Did Not Require Pressors and Hemodynamically Improve, during This Time Seated Was Negative for  the Event of Acute Renal Failure and His Creatinine Returned to 1.7 He Was Found to Have an UTI Which Was essentially treated IV fluids a history of osteomyelitis of the sacrum and coccyx   He  presented to the ED with lethargy and gross hematuria on 08-16-15. The patient has a chronic indwelling catheter . He has been admitted for symptomatic anemia, he is now s/p PRBC. He is on Rocephin. Palliative consult placed for goals of care.   The patient was sleeping when I went by to see him this afternoon, upon my subsequent return, he was asking for pain medications. The patient was given PRN pain medication, I re introduced myself. The patient states he does not wish to go to Hospice home, he wishes to go home towards the end of this hospitalization. At present, he denies any complaints, his HCPOA friend Collie Siad, is not present at the bedside. The patient does not wish to engage at present in any further "big picture"/ long term type discussions regarding his overall condition.   Continue current treatment with IVF, Rocephin, PRBCs and electrolyte replacement. Palliative will follow up on 08-18-15.   Contacts/Participants in Discussion: Primary Decision Maker:     Relationship to Patient   HCPOA: yes     SUMMARY OF RECOMMENDATIONS: Continue scope of current hospitalization Patient wishes to continue with Hospice support at home when deemed medically stable for D/C At present, does not wish to further engage in long term discussions about his illness burden and disease trajectory, palliative to follow up.   Code Status/Advance Care Planning: DNR    Code Status Orders        Start  Ordered   08/17/15 0342  Do not attempt resuscitation (DNR)   Continuous    Question Answer Comment  In the event of cardiac or respiratory ARREST Do not call a "code blue"   In the event of cardiac or respiratory ARREST Do not perform Intubation, CPR, defibrillation or ACLS   In the event of cardiac or respiratory  ARREST Use medication by any route, position, wound care, and other measures to relive pain and suffering. May use oxygen, suction and manual treatment of airway obstruction as needed for comfort.      08/17/15 0346    Code Status History    Date Active Date Inactive Code Status Order ID Comments User Context   05/03/2015  3:57 AM 05/04/2015  4:45 PM Full Code [id number]  Theressa Millard, MD Inpatient   04/13/2013  2:51 AM 04/17/2013  2:59 PM Full Code [id number]  Etta Quill, DO ED   09/29/2011  5:56 PM 10/08/2011  7:46 PM Full Code [id number]  Sindy Guadeloupe, RN Inpatient    Advance Directive Documentation        Most Recent Value   Type of Advance Directive  Out of facility DNR (pink MOST or yellow form)   Pre-existing out of facility DNR order (yellow form or pink MOST form)  Yellow form placed in chart (order not valid for inpatient use)   "MOST" Form in Place?        Other Directives:Other  Symptom Management:    continue current treatment, pain reasonably well controlled with current regimen.   Palliative Prophylaxis:   Delirium Protocol  Additional Recommendations (Limitations, Scope, Preferences):   continue current treatment    Psycho-social/Spiritual:  Support System: [place] Desire for further Chaplaincy support:no Additional Recommendations: Caregiving  Support/Resources  Prognosis: less than 6 months.   Discharge Planning: Home with Hospice Patient does not wish to go to [facility]. He is accepting of hospice support at home.   Chief Complaint/ Primary Diagnoses: Present on Admission:  . AKI (acute kidney injury) ([place]) . Symptomatic anemia . Urinary tract infection associated with indwelling urethral catheter . HIV (human immunodeficiency virus infection) ([place])  I have reviewed the medical record, interviewed the patient and family, and examined the patient. The following aspects are pertinent.  Past Medical History  Diagnosis Date  . Heart disease     . HIV (human immunodeficiency virus infection) ([place])   . Anemia   . Anxiety   . Blood transfusion   . Cataract   . Depression   . GERD (gastroesophageal reflux disease)   . Thyroid disease     low thyroid  . 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 HIV (human immunodeficiency virus infection) ([place]) 04/20/2011  . Dementia 04/20/2011  . CAD (coronary artery disease) 04/20/2011  . Hypothyroidism 04/20/2011  . Dyslipidemia 04/20/2011  . Hypertension   . Dysrhythmia   . Shortness of breath   . Pneumonia   . Hepatitis   . Cancer ([place])     lymphoma  . DLBCL (diffuse large B cell lymphoma) ([place]) 04/20/2011   Social History   Social History  . Marital Status: Single    Spouse Name: N/A  . Number of Children: N/A  . Years of Education: N/A   Social History Main Topics  . Smoking status: Current Some Day Smoker -- 0.25 packs/day for 10 years    Types: Cigarettes    Last Attempt to Quit: 03/30/2011  . Smokeless tobacco: None  . Alcohol Use:  No  . Drug Use: No  . Sexual Activity: No   Other Topics Concern  . None   Social History Narrative   Family History  Problem Relation Age of Onset  . Colon cancer Mother   . Colon cancer Brother    Scheduled Meds: . cefTRIAXone (ROCEPHIN)  IV  1 g Intravenous Q24H  . diphenoxylate-atropine  2 tablet Oral QID  . dolutegravir  50 mg Oral Daily  . emtricitabine-tenofovir AF  1 tablet Oral Daily  . feeding supplement (ENSURE ENLIVE)  237 mL Oral BID BM  . [START ON 08/19/2015] fentaNYL  100 mcg Transdermal Q72H  . gabapentin  300 mg Oral TID  . levothyroxine  112 mcg Oral QAC breakfast  . loperamide  2 mg Oral TID PC & HS  . metoprolol succinate  50 mg Oral QODAY  . pantoprazole  40 mg Oral Daily  . valACYclovir  500 mg Oral Daily   Continuous Infusions: . 0.9 % NaCl with KCl 20 mEq / L 100 mL/hr at 08/17/15 0713   PRN Meds:.acetaminophen **OR** acetaminophen, diazepam, HYDROmorphone, nitroGLYCERIN, ondansetron **OR**  ondansetron (ZOFRAN) IV Medications Prior to Admission:  Prior to Admission medications   Medication Sig Start Date End Date Taking? Authorizing Provider  DESCOVY 200-25 MG tablet Take 1 tablet by mouth daily. 04/18/15  Yes Historical Provider, MD  diazepam (VALIUM) 5 MG tablet Take 5 mg by mouth at bedtime as needed for anxiety.   Yes Historical Provider, MD  diphenoxylate-atropine (LOMOTIL) 2.5-0.025 MG per tablet Take 2 tablets by mouth 4 (four) times daily.    Yes Historical Provider, MD  dolutegravir (TIVICAY) 50 MG tablet Take 50 mg by mouth daily.   Yes Historical Provider, MD  fentaNYL (DURAGESIC - DOSED MCG/HR) 100 MCG/HR Place 100 mcg onto the skin every 3 (three) days.   Yes Historical Provider, MD  gabapentin (NEURONTIN) 300 MG capsule Take 300 mg by mouth 3 (three) times daily.   Yes Historical Provider, MD  HYDROmorphone (DILAUDID) 4 MG tablet Take 4 mg by mouth every 4 (four) hours as needed for moderate pain or severe pain.   Yes Historical Provider, MD  levothyroxine (SYNTHROID, LEVOTHROID) 112 MCG tablet Take 112 mcg by mouth daily before breakfast.   Yes Historical Provider, MD  loperamide (IMODIUM) 2 MG capsule Take 2 mg by mouth 4 (four) times daily - after meals and at bedtime.    Yes Historical Provider, MD  metoprolol succinate (TOPROL-XL) 50 MG 24 hr tablet Take 50 mg by mouth every other day.  04/14/15  Yes Historical Provider, MD  pantoprazole (PROTONIX) 40 MG tablet Take 40 mg by mouth daily.   Yes Historical Provider, MD  simethicone (MYLICON) 0000000 MG chewable tablet Chew 125 mg by mouth every 6 (six) hours as needed for flatulence.   Yes Historical Provider, MD  valACYclovir (VALTREX) 500 MG tablet Take 500 mg by mouth daily.    Yes Historical Provider, MD  nitroGLYCERIN (NITROSTAT) 0.4 MG SL tablet Place 0.4 mg under the tongue every 5 (five) minutes as needed for chest pain.     Historical Provider, MD   Allergies  Allergen Reactions  . Lidocaine Hcl Rash    Patient  had to be rushed to hospital from dentist office after receiving xylocaine  . Trazodone And Nefazodone Other (See Comments)    Nightmares, hangover like feeling for 2-3 days  . Oxycodone Other (See Comments) and Palpitations    Made him off balance and loopy Dizzy, nausea  and tachycardia    Review of Systems Mild to moderate abdominal pain generalized Weakness  Physical Exam Pale weak NAD Clear S1 S2 Abdomen mild gen tenderness No edema Non focal  Vital Signs: BP 95/54 mmHg  Pulse 65  Temp(Src) 98.8 F (37.1 C) (Oral)  Resp 18  Wt 62.959 kg (138 lb 12.8 oz)  SpO2 97%  SpO2: SpO2: 97 % O2 Device:SpO2: 97 % O2 Flow Rate: .   IO: Intake/output summary:  Intake/Output Summary (Last 24 hours) at 08/17/15 1719 Last data filed at 08/17/15 1538  Gross per 24 hour  Intake 2375.33 ml  Output   1500 ml  Net 875.33 ml    LBM: Last BM Date: 08/17/15 Baseline Weight: Weight: 62.959 kg (138 lb 12.8 oz) Most recent weight: Weight: 62.959 kg (138 lb 12.8 oz)      Palliative Assessment/Data:  Flowsheet Rows        Most Recent Value   Intake Tab    Referral Department  Hospitalist   Unit at Time of Referral  Med/Surg Unit   Palliative Care Primary Diagnosis  Cancer   Palliative Care Type  New Palliative care   Reason for referral  Clarify Goals of Care, Counsel Regarding Hospice   Clinical Assessment    Palliative Performance Scale Score  30%   Pain Max last 24 hours  6   Pain Min Last 24 hours  5   Psychosocial & Spiritual Assessment    Palliative Care Outcomes    Patient/Family meeting held?  Yes   Who was at the meeting?  patient   Palliative Care Outcomes  Counseled regarding hospice, Clarified goals of care   Palliative Care follow-up planned  Yes, Facility      Additional Data Reviewed:  CBC:    Component Value Date/Time   WBC 4.7 08/17/2015 0015   HGB 7.6* 08/17/2015 0015   HCT 24.9* 08/17/2015 0015   PLT 125* 08/17/2015 0015   MCV 90.2 08/17/2015 0015     NEUTROABS 3.4 08/17/2015 0015   LYMPHSABS 0.8 08/17/2015 0015   MONOABS 0.4 08/17/2015 0015   EOSABS 0.1 08/17/2015 0015   BASOSABS 0.0 08/17/2015 0015   Comprehensive Metabolic Panel:    Component Value Date/Time   NA 138 08/17/2015 0015   K 3.7 08/17/2015 0015   CL 106 08/17/2015 0015   CO2 21* 08/17/2015 0015   BUN 26* 08/17/2015 0015   CREATININE 2.11* 08/17/2015 0015   GLUCOSE 125* 08/17/2015 0015   CALCIUM 8.0* 08/17/2015 0015   AST 11* 05/02/2015 2240   ALT 9* 05/02/2015 2240   ALKPHOS 63 05/02/2015 2240   BILITOT 0.4 05/02/2015 2240   PROT 7.2 05/02/2015 2240   ALBUMIN 3.2* 05/02/2015 2240     Time In:  1600 Time Out:  1700 Time Total:  60 Greater than 50%  of this time was spent counseling and coordinating care related to the above assessment and plan.  Signed by: Loistine Chance, MD [id number] Loistine Chance, MD  08/17/2015, 5:19 PM  Please contact Palliative Medicine Team phone at [phone] for questions and concerns.

## 2015-08-17 NOTE — Progress Notes (Signed)
[facility] PROGRESS NOTE    Progress Note   Troy Holden [id number] DOB: 1951/01/12 DOA: 08/16/2015 PCP: Hayden Rasmussen., MD   Brief Narrative:   Troy Holden is an 65 y.o. 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, history of rectal cancer leading to prostatectomy which eventually led to an urgogenic bladder and suprapubic cath placed treated with chemotherapy until August 2014, a PET scan done on November 2014 showed treatment response with almost complete resolution in the size of the rectal mass, however a PET scan repeated in May 2016 showed recurrence of his rectal cancer, at this time he underwent prostatectomy  on a visit with his oncologist in August 2016 he decided not to proceed with palliative chemotherapy as the potential risk of weight the benefits, he doesn't wish to pursue chemotherapy during that visit hospice was introduced as well as end-of-life. He ultimately elected to transition to palliative care at home for pain controlled, is also had several episodes of acute renal failure with the last 07/31/2013 with his creatinine going as high as 5.8 which improve with IV fluid, he subsequently developed sepsis and bacteremia with pan sensitive Escherichia coli most likely due to urinary source at that time nephrostomy tubes had to be placed which are now removed, August 2016 Had New Onset Aflutter with Worsening Diarrhea and Hypovolemic Shock He Was Treated Aggressively with Volume and Conservative Management He Did Not Require Pressors and Hemodynamically Improve, during This Time Seated Was Negative for the Event of Acute Renal Failure and His Creatinine Returned to 1.7 He Was Found to Have an UTI Which Was essentially treated IV fluids a history of osteomyelitis of the sacrum and coccyx is no presents to the ED  with lethargy and gross hematuria. The patient has a chronic indwelling catheter when he noticed he was having bright red urine. There is a nodule with 2 g and a hemoglobin compared to the previous one done 3 months ago. CT scan of the abdomen and pelvis  Assessment/Plan:  Symptomatic anemia: He denies any melanotic stools or bright red blood per rectum. He was having significant red urine for several days prior to coming to the hospital he is status post 2 units of packed red blood cells and feels much better.  HIV (human immunodeficiency virus infection) ([place]) Continue her therapy.  AKI (acute kidney injury) [facility] [address]): Likely prerenal in etiology he was started on IV fluids, will recheck a basic metabolic panel in the morning.  UTI with hematuria/  Urinary tract infection associated with indwelling urethral catheter: Associated with it chronic indwelling Foley catheter. I agree with IV Rocephin urine cultures are pending. He was discussed with him that these are only temporary measures is diffuse infection in his pelvis and it's unlikely that medical management will improve this infectious process.  Invasive rectal cancer/osteomyelitis of the sacrum and coccyx: Not a candidate for chemotherapy Consult wound care for sacral radiculitis ulcer.    DVT Prophylaxis - SCD's  Family Communication: none Disposition Plan: home 3-4 days Code Status:     Code Status Orders        Start     Ordered   08/17/15 0342  Do not attempt resuscitation (DNR)   Continuous    Question Answer Comment  In the event of cardiac or respiratory ARREST Do not call  a "code blue"   In the event of cardiac or respiratory ARREST Do not perform Intubation, CPR, defibrillation or ACLS   In the event of cardiac or respiratory ARREST Use medication by any route, position, wound care, and other measures to relive pain and suffering. May use oxygen, suction and manual treatment of airway obstruction as needed for  comfort.      August 23, 2015 0346    Code Status History    Date Active Date Inactive Code Status Order ID Comments User Context   05/03/2015  3:57 AM 05/04/2015  4:45 PM Full Code [id number]  Theressa Millard, MD Inpatient   04/13/2013  2:51 AM 04/17/2013  2:59 PM Full Code [id number]  Etta Quill, DO ED   09/29/2011  5:56 PM 10/08/2011  7:46 PM Full Code [id number]  Sindy Guadeloupe, RN Inpatient    Advance Directive Documentation        Most Recent Value   Type of Advance Directive  Out of facility DNR (pink MOST or yellow form)   Pre-existing out of facility DNR order (yellow form or pink MOST form)  Yellow form placed in chart (order not valid for inpatient use)   "MOST" Form in Place?          IV Access:    Peripheral IV   Procedures and diagnostic studies:   Ct Renal Stone Study  2015/08/23  CLINICAL DATA:  Acute onset of hematuria.  Initial encounter. EXAM: CT ABDOMEN AND PELVIS WITHOUT CONTRAST TECHNIQUE: Multidetector CT imaging of the abdomen and pelvis was performed following the standard protocol without IV contrast. COMPARISON:  CT of the abdomen and pelvis from 05/03/2015 FINDINGS: Minimal bibasilar atelectasis is noted. Scattered coronary artery calcifications are seen. The liver is unremarkable in appearance. The spleen is significantly enlarged, measuring 16.6 cm and bulky in appearance. Calcification along the lateral edge of the spleen likely reflects remote traumatic injury. A single large stone is noted within the gallbladder. The gallbladder is otherwise grossly unremarkable. The pancreas and adrenal glands are unremarkable in appearance. There is suggestion of splenic varices about the splenic hilum. There is prominence of the extrarenal pelves bilaterally, particularly on the left, with suggestion of underlying mild left-sided hydronephrosis. There is thickening of the renal pelvic walls, raising question for ureteritis. Mild stranding is noted about the kidneys, and  underlying pyelonephritis is a concern. No renal or ureteral stones are identified. No free fluid is identified. The small bowel is unremarkable in appearance. A bowel suture line is noted at the right hemipelvis. The stomach is within normal limits. No acute vascular abnormalities are seen. Diffuse calcification is noted along the abdominal aorta and its branches, with likely moderate to severe luminal narrowing along the common iliac arteries bilaterally. The appendix is not well characterized; there is no evidence of appendicitis. The colon is largely filled with stool. There is diffuse wall thickening along the rectum, which may reflect proctitis. The bladder is diffusely thick walled and irregular in appearance, similar in appearance to the prior study, possibly corresponding to the patient's hematuria. This likely reflects chronic infection, though underlying mass cannot be entirely excluded. The prostate remains normal in size. The patient's Foley catheter is noted in expected position. Prominent bilateral inguinal nodes are noted, measuring up to 1.5 cm on the left. There is significantly worsened erosion involving the distal right sacrum and coccyx, with absence of the right half of the coccyx, reflecting significant osteomyelitis. The metallic lead of the patient's right flank  stimulator traverses this region. Underlying diffuse soft tissue phlegmon is again noted, tracking about the rectum and pelvic soft tissues. Edema tracks along the pelvic sidewalls bilaterally. IMPRESSION: 1. Diffuse phlegmon noted within the lower pelvis, with inflammation about the rectum and bladder. This likely reflects chronic infection within the pelvis, given the patient's perirectal fistula in 2015 and chronic phlegmon on the prior study. 2. 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 3. Diffuse irregular bladder wall thickening again noted. This may correspond to the patient's hematuria. As this is stable  from the prior study, it likely reflects chronic or recurrent infection from the underlying pelvic process. Underlying mass cannot be entirely excluded. 4. Previously noted hydronephrosis has largely resolved, with mild residual left-sided hydronephrosis seen, and prominence of the extrarenal pelves bilaterally. Thickening of the renal pelvic walls raises concern for ureteritis. Mild stranding about the kidneys noted. Underlying pyelonephritis is a concern. 5. Significantly worsened erosion involving the distal right sacrum and coccyx, with absence of the right half of the coccyx, reflecting significant osteomyelitis. This likely reflects infection tracking from the soft tissue phlegmon within the pelvis. Underlying right flank stimulator lead noted, though the chronic infection likely arose from the prior perirectal fistula in this region. 6. Splenomegaly noted. 7. Scattered coronary artery calcifications seen. 8. Cholelithiasis.  Gallbladder otherwise grossly unremarkable. 9. Diffuse calcification along the abdominal aorta and its branches, with likely moderate to severe luminal narrowing along the common iliac arteries bilaterally. 10. Prominent bilateral inguinal nodes noted. These results were called by telephone at the time of interpretation on 08/17/2015 at 2:55 am to Dr. Lacretia Leigh, who verbally acknowledged these results. Electronically Signed   By: Garald Balding M.D.   On: 08/17/2015 03:01     Medical Consultants:    None.  Anti-Infectives:   Anti-infectives    Start     Dose/Rate Route Frequency Ordered Stop   08/17/15 1000  emtricitabine-tenofovir AF (DESCOVY) 200-25 MG per tablet 1 tablet     1 tablet Oral Daily 08/17/15 0346     08/17/15 1000  valACYclovir (VALTREX) tablet 500 mg     500 mg Oral Daily 08/17/15 0346     08/17/15 1000  dolutegravir (TIVICAY) tablet 50 mg     50 mg Oral Daily 08/17/15 0346     08/17/15 0315  cefTRIAXone (ROCEPHIN) 1 g in dextrose 5 % 50 mL IVPB      1 g 100 mL/hr over 30 Minutes Intravenous Every 24 hours 08/17/15 0310        Subjective:    Troy Holden he relates he feels better after the transfusion. Will like to meet with palliative care.  Objective:    Filed Vitals:   08/17/15 0400 08/17/15 0402 08/17/15 0415 08/17/15 0525  BP: 98/59 98/59 112/62 109/61  Pulse: 73 73  76  Temp: 98.2 F (36.8 C) 98.2 F (36.8 C) 98.1 F (36.7 C) 98.4 F (36.9 C)  TempSrc:  Oral Oral Oral  Resp:  18 16 16   Weight:    62.959 kg (138 lb 12.8 oz)  SpO2: 95% 95%  94%    Intake/Output Summary (Last 24 hours) at 08/17/15 1042 Last data filed at 08/17/15 0655  Gross per 24 hour  Intake    670 ml  Output    450 ml  Net    220 ml   Filed Weights   08/17/15 0525  Weight: 62.959 kg (138 lb 12.8 oz)    Exam: Gen:  NAD Cardiovascular:  RRR, No Murmur Chest and lungs:   CTAB Abdomen:  Abdomen soft, NT/ND, + BS Extremities:  No edema   Data Reviewed:    Labs: Basic Metabolic Panel:  Recent Labs Lab 08/17/15 0015  NA 138  K 3.7  CL 106  CO2 21*  GLUCOSE 125*  BUN 26*  CREATININE 2.11*  CALCIUM 8.0*   GFR Estimated Creatinine Clearance: 31.9 mL/min (by C-G formula based on Cr of 2.11). Liver Function Tests: No results for input(s): AST, ALT, ALKPHOS, BILITOT, PROT, ALBUMIN in the last 168 hours. No results for input(s): LIPASE, AMYLASE in the last 168 hours. No results for input(s): AMMONIA in the last 168 hours. Coagulation profile  Recent Labs Lab 08/17/15 0347  INR 1.20    CBC:  Recent Labs Lab 08/17/15 0015  WBC 4.7  NEUTROABS 3.4  HGB 7.6*  HCT 24.9*  MCV 90.2  PLT 125*   Cardiac Enzymes: No results for input(s): CKTOTAL, CKMB, CKMBINDEX, TROPONINI in the last 168 hours. BNP (last 3 results) No results for input(s): PROBNP in the last 8760 hours. CBG:  Recent Labs Lab 08/17/15 0746  GLUCAP 93   D-Dimer: No results for input(s): DDIMER in the last 72 hours. Hgb A1c: No results for  input(s): HGBA1C in the last 72 hours. Lipid Profile: No results for input(s): CHOL, HDL, LDLCALC, TRIG, CHOLHDL, LDLDIRECT in the last 72 hours. Thyroid function studies: No results for input(s): TSH, T4TOTAL, T3FREE, THYROIDAB in the last 72 hours.  Invalid input(s): FREET3 Anemia work up: No results for input(s): VITAMINB12, FOLATE, FERRITIN, TIBC, IRON, RETICCTPCT in the last 72 hours. Sepsis Labs:  Recent Labs Lab 08/17/15 0015  WBC 4.7   Microbiology No results found for this or any previous visit (from the past 240 hour(s)).   Medications:   . cefTRIAXone (ROCEPHIN)  IV  1 g Intravenous Q24H  . diphenoxylate-atropine  2 tablet Oral QID  . dolutegravir  50 mg Oral Daily  . emtricitabine-tenofovir AF  1 tablet Oral Daily  . feeding supplement (ENSURE ENLIVE)  237 mL Oral BID BM  . [START ON 08/19/2015] fentaNYL  100 mcg Transdermal Q72H  . gabapentin  300 mg Oral TID  . levothyroxine  112 mcg Oral QAC breakfast  . loperamide  2 mg Oral TID PC & HS  . metoprolol succinate  50 mg Oral QODAY  . pantoprazole  40 mg Oral Daily  . valACYclovir  500 mg Oral Daily   Continuous Infusions: . 0.9 % NaCl with KCl 20 mEq / L 100 mL/hr at 08/17/15 0713    Time spent: 25 min   LOS: 0 days   Troy Holden  [facility] Pager [phone]  *Please refer to [email or web address], password [email or web address] to get updated schedule on who will round on this patient, as hospitalists switch teams weekly. If 7PM-7AM, please contact night-coverage at [email or web address], password [email or web address] for any overnight needs.  08/17/2015, 10:42 AM

## 2015-08-17 NOTE — Progress Notes (Signed)
DNR.  Related admission.  Patient is currently with [facility] and was admitted to services on 07/04/15 with a dx of Rectal CA.  Patient is awake and verbal at the time of this visit.  States he is feeling better since his transfusion.  Reports ongoing fatigue and pain but no different than normal.  Chart reviewed and spoke with Jenny Reichmann, RN for patient.  Please contact [place] with any questions or concerns.  Vance Gather, RN [facility]

## 2015-08-17 NOTE — Progress Notes (Signed)
Patient arrived on the unit at 0500 from the ED. He had 1 unit of PRBC infusing at 125cc/hr. Transfusion completed without any adverse reactions.Medicated for pain x 1. No other complaints voiced.

## 2015-08-17 NOTE — Progress Notes (Signed)
Blood transfusion sheet with one nurse signature. No time or date noted on sheet.

## 2015-08-17 NOTE — H&P (Signed)
[facility] History and Physical  Troy Holden [id number] DOB: 1951-04-06 DOA: 08/16/2015  Referring physician: ED physician PCP: Hayden Rasmussen., MD  Specialists:   Chief Complaint:  Gross hematuria  HPI: Troy Holden is a 65 y.o. male with PMH of HIV on HAART with last CD4  7, DLBCL, CAD, HTN, and osteomyelitis of the sacrum and coccyx who presents to the ED with lethargy and gross hematuria since 2 PM on 08/16/2015.  The patient has a chronic indwelling Foley catheter and noted pink fluid in his bag initially, progressing to bright red over the course of the afternoon. There were subjective fevers associated with this, but no lower abdominal pain or flank pain. Patient denies history of kidney stones. He recalls a similar episode several months ago.   In ED, patient was found to be afebrile, saturating well on room air, and with vital signs stable. Blood work reveals a acute on chronic kidney disease, metabolic acidosis, and normocytic anemia with 2 g drop in hemoglobin since last checked 3 months ago. A CT of the abdomen was obtained under stone protocol and redemonstrates a diffuse phlegmon in the lower pelvis with inflammatory change around the rectum and urinary bladder, with the right half of coccyx completely eroded secondary to osteomyelitis. Bladder wall has irregular thickening, likely secondary to infection arising from the aforementioned pelvic process. Urine was turbid, and on analysis there are many bacteria, large leukocytes, and positive nitrite. Patient was treated empirically with Rocephin, 1 unit of packed red blood cells was ordered for transfusion, pain was treated with fentanyl patch and Dilaudid, and IV fluids initiated with normal saline. The hospitalist will admit for ongoing evaluation and management of symptomatic anemia and acute cystitis.  Where does patient live?   At home    Can patient participate in ADLs?  Yes        Review of Systems:   General:  Fever, chills, fatigue, no sweats, weight change, or change in appetite  HEENT: no blurry vision, hearing changes or sore throat Pulm: no dyspnea, cough, or wheeze CV: no chest pain or palpitations Abd: no nausea, vomiting, or constipation. Chronic abdominal pain and diarrhea GU: Chronic indwelling foley, gross hematuria  Ext: no leg edema Neuro: no focal weakness, numbness, or tingling, no vision change or hearing loss Skin: no rash, no new wounds MSK: No muscle spasm, no deformity, no red, hot, or swollen joint Heme: No easy bruising or bleeding Travel history: No recent long distant travel    Allergy:  Allergies  Allergen Reactions  . Lidocaine Hcl Rash    Patient had to be rushed to hospital from dentist office after receiving xylocaine  . Trazodone And Nefazodone Other (See Comments)    Nightmares, hangover like feeling for 2-3 days  . Oxycodone Other (See Comments) and Palpitations    Made him off balance and loopy Dizzy, nausea and tachycardia    Past Medical History  Diagnosis Date  . Heart disease   . HIV (human immunodeficiency virus infection) ([place])   . Anemia   . Anxiety   . Blood transfusion   . Cataract   . Depression   . GERD (gastroesophageal reflux disease)   . Thyroid disease     low thyroid  . 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 HIV (human immunodeficiency virus infection) ([place]) 04/20/2011  . Dementia 04/20/2011  . CAD (coronary artery disease) 04/20/2011  . Hypothyroidism 04/20/2011  . Dyslipidemia 04/20/2011  . Hypertension   . Dysrhythmia   .  Shortness of breath   . Pneumonia   . Hepatitis   . Cancer ([place])     lymphoma  . DLBCL (diffuse large B cell lymphoma) ([place]) 04/20/2011    Past Surgical History  Procedure Laterality Date  . Cardiac surgery    . Abdominal surgery    . Colonoscopy    . Tumor removal      intestine  . Muscle stimulator       In lower back    Social History:  reports that he has been smoking Cigarettes.   He has a 2.5 pack-year smoking history. He does not have any smokeless tobacco history on file. He reports that he does not drink alcohol or use illicit drugs.  Family History:  Family History  Problem Relation Age of Onset  . Colon cancer Mother   . Colon cancer Brother      Prior to Admission medications   Medication Sig Start Date End Date Taking? Authorizing Provider  DESCOVY 200-25 MG tablet Take 1 tablet by mouth daily. 04/18/15  Yes Historical Provider, MD  diazepam (VALIUM) 5 MG tablet Take 5 mg by mouth at bedtime as needed for anxiety.   Yes Historical Provider, MD  diphenoxylate-atropine (LOMOTIL) 2.5-0.025 MG per tablet Take 2 tablets by mouth 4 (four) times daily.    Yes Historical Provider, MD  dolutegravir (TIVICAY) 50 MG tablet Take 50 mg by mouth daily.   Yes Historical Provider, MD  fentaNYL (DURAGESIC - DOSED MCG/HR) 100 MCG/HR Place 100 mcg onto the skin every 3 (three) days.   Yes Historical Provider, MD  gabapentin (NEURONTIN) 300 MG capsule Take 300 mg by mouth 3 (three) times daily.   Yes Historical Provider, MD  HYDROmorphone (DILAUDID) 4 MG tablet Take 4 mg by mouth every 4 (four) hours as needed for moderate pain or severe pain.   Yes Historical Provider, MD  levothyroxine (SYNTHROID, LEVOTHROID) 112 MCG tablet Take 112 mcg by mouth daily before breakfast.   Yes Historical Provider, MD  loperamide (IMODIUM) 2 MG capsule Take 2 mg by mouth 4 (four) times daily - after meals and at bedtime.    Yes Historical Provider, MD  metoprolol succinate (TOPROL-XL) 50 MG 24 hr tablet Take 50 mg by mouth every other day.  04/14/15  Yes Historical Provider, MD  pantoprazole (PROTONIX) 40 MG tablet Take 40 mg by mouth daily.   Yes Historical Provider, MD  simethicone (MYLICON) 0000000 MG chewable tablet Chew 125 mg by mouth every 6 (six) hours as needed for flatulence.   Yes Historical Provider, MD  valACYclovir (VALTREX) 500 MG tablet Take 500 mg by mouth daily.    Yes Historical  Provider, MD  nitroGLYCERIN (NITROSTAT) 0.4 MG SL tablet Place 0.4 mg under the tongue every 5 (five) minutes as needed for chest pain.     Historical Provider, MD    Physical Exam: Filed Vitals:   08/17/15 0142 08/17/15 0200 08/17/15 0230 08/17/15 0300  BP: 104/62 106/68 104/64 100/62  Pulse: 72 76 76 74  Temp:      TempSrc:      Resp:      SpO2: 94% 93% 95% 94%   General: Not in acute distress. Cachectic  HEENT:       Eyes: PERRL,no scleral icterus or conjunctival pallor.       ENT: No discharge from the ears or nose, no pharyngeal ulcers, petechiae or exudate, no tonsillar enlargement.        Neck: No JVD, no bruit,  no appreciable mass Heme: No cervical adenopathy, no pallor Cardiac: S1/S2, RRR, No murmurs, No gallops or rubs. Pulm: Good air movement bilaterally. No ronchi, scattered wheeze on exhalation. Abd: Soft, nondistended, mild tenderness throughout, no rebound pain or gaurding, no mass or organomegaly, BS present. Ext: No LE edema bilaterally. 1+DP/PT pulse bilaterally. Musculoskeletal: No gross deformity, no red, hot, swollen joints   Skin: No rashes or wounds on exposed surfaces  Neuro: Alert, oriented X3, cranial nerves II-XII grossly intact. No focal findings Psych: Patient is not overtly psychotic, calm and cooperative.  Labs on Admission:  Basic Metabolic Panel:  Recent Labs Lab 08/17/15 0015  NA 138  K 3.7  CL 106  CO2 21*  GLUCOSE 125*  BUN 26*  CREATININE 2.11*  CALCIUM 8.0*   Liver Function Tests: No results for input(s): AST, ALT, ALKPHOS, BILITOT, PROT, ALBUMIN in the last 168 hours. No results for input(s): LIPASE, AMYLASE in the last 168 hours. No results for input(s): AMMONIA in the last 168 hours. CBC:  Recent Labs Lab 08/17/15 0015  WBC 4.7  NEUTROABS 3.4  HGB 7.6*  HCT 24.9*  MCV 90.2  PLT 125*   Cardiac Enzymes: No results for input(s): CKTOTAL, CKMB, CKMBINDEX, TROPONINI in the last 168 hours.  BNP (last 3 results) No  results for input(s): BNP in the last 8760 hours.  ProBNP (last 3 results) No results for input(s): PROBNP in the last 8760 hours.  CBG: No results for input(s): GLUCAP in the last 168 hours.  Radiological Exams on Admission: Ct Renal Stone Study  08/17/2015  CLINICAL DATA:  Acute onset of hematuria.  Initial encounter. EXAM: CT ABDOMEN AND PELVIS WITHOUT CONTRAST TECHNIQUE: Multidetector CT imaging of the abdomen and pelvis was performed following the standard protocol without IV contrast. COMPARISON:  CT of the abdomen and pelvis from 05/03/2015 FINDINGS: Minimal bibasilar atelectasis is noted. Scattered coronary artery calcifications are seen. The liver is unremarkable in appearance. The spleen is significantly enlarged, measuring 16.6 cm and bulky in appearance. Calcification along the lateral edge of the spleen likely reflects remote traumatic injury. A single large stone is noted within the gallbladder. The gallbladder is otherwise grossly unremarkable. The pancreas and adrenal glands are unremarkable in appearance. There is suggestion of splenic varices about the splenic hilum. There is prominence of the extrarenal pelves bilaterally, particularly on the left, with suggestion of underlying mild left-sided hydronephrosis. There is thickening of the renal pelvic walls, raising question for ureteritis. Mild stranding is noted about the kidneys, and underlying pyelonephritis is a concern. No renal or ureteral stones are identified. No free fluid is identified. The small bowel is unremarkable in appearance. A bowel suture line is noted at the right hemipelvis. The stomach is within normal limits. No acute vascular abnormalities are seen. Diffuse calcification is noted along the abdominal aorta and its branches, with likely moderate to severe luminal narrowing along the common iliac arteries bilaterally. The appendix is not well characterized; there is no evidence of appendicitis. The colon is largely  filled with stool. There is diffuse wall thickening along the rectum, which may reflect proctitis. The bladder is diffusely thick walled and irregular in appearance, similar in appearance to the prior study, possibly corresponding to the patient's hematuria. This likely reflects chronic infection, though underlying mass cannot be entirely excluded. The prostate remains normal in size. The patient's Foley catheter is noted in expected position. Prominent bilateral inguinal nodes are noted, measuring up to 1.5 cm on the left. There is  significantly worsened erosion involving the distal right sacrum and coccyx, with absence of the right half of the coccyx, reflecting significant osteomyelitis. The metallic lead of the patient's right flank stimulator traverses this region. Underlying diffuse soft tissue phlegmon is again noted, tracking about the rectum and pelvic soft tissues. Edema tracks along the pelvic sidewalls bilaterally. IMPRESSION: 1. Diffuse phlegmon noted within the lower pelvis, with inflammation about the rectum and bladder. This likely reflects chronic infection within the pelvis, given the patient's perirectal fistula in 2015 and chronic phlegmon on the prior study. 2. 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 3. Diffuse irregular bladder wall thickening again noted. This may correspond to the patient's hematuria. As this is stable from the prior study, it likely reflects chronic or recurrent infection from the underlying pelvic process. Underlying mass cannot be entirely excluded. 4. Previously noted hydronephrosis has largely resolved, with mild residual left-sided hydronephrosis seen, and prominence of the extrarenal pelves bilaterally. Thickening of the renal pelvic walls raises concern for ureteritis. Mild stranding about the kidneys noted. Underlying pyelonephritis is a concern. 5. Significantly worsened erosion involving the distal right sacrum and coccyx, with absence of the  right half of the coccyx, reflecting significant osteomyelitis. This likely reflects infection tracking from the soft tissue phlegmon within the pelvis. Underlying right flank stimulator lead noted, though the chronic infection likely arose from the prior perirectal fistula in this region. 6. Splenomegaly noted. 7. Scattered coronary artery calcifications seen. 8. Cholelithiasis.  Gallbladder otherwise grossly unremarkable. 9. Diffuse calcification along the abdominal aorta and its branches, with likely moderate to severe luminal narrowing along the common iliac arteries bilaterally. 10. Prominent bilateral inguinal nodes noted. These results were called by telephone at the time of interpretation on 08/17/2015 at 2:55 am to Dr. Lacretia Leigh, who verbally acknowledged these results. Electronically Signed   By: Garald Balding M.D.   On: 08/17/2015 03:01    EKG:  Not done in ED, will obtain as appropriate   Assessment/Plan  1. Symptomatic anemia  - Hgb 7.6 on admission, down from 9.7 05/04/15  - Likely secondary to frank hematuria on top of chronic disease  - 1 unit pRBCs ordered for transfusion  - Will check H/H post-transfusion, consider 2nd unit  - Holding pharmacologic VTE ppx for now, SCD only  - Treating UTI as below  2. UTI with hematuria - Associated with chronic foley  - UA grossly positive for infection, culture in-progress  - Rocephin empirically until culture data available  - Irrigate foley  - These are only temporizing measures given that diffuse underlying infectious process in pelvis is worsening and unlikely to be cured with medical mgmt    3. AKI  - SCr 2.11 on presentation, up from 1.2 three mos prior  - Likely prerenal etiology, secondary to acute infection and blood loss  - IVF with NS at 100 cc/hr overnight  - Treating infection as above  - Repeat chem panel tomorrow, anticipating improvement with IVF hydration   4. HIV  - CD4 81 in August '16  - Continue HAART with  Tivicay and Descovy    DVT ppx:  SCDs  Code Status: Full code Family Communication: None at bed side.   Disposition Plan: Admit to inpatient   Date of Service 08/17/2015    Vianne Bulls, MD [facility] Pager [phone]  If 7PM-7AM, please contact night-coverage [email or web address] Password [facility] 08/17/2015, 3:46 AM

## 2015-08-18 LAB — TYPE AND SCREEN
ABO/RH(D): B NEG
ANTIBODY SCREEN: NEGATIVE
Unit division: 0

## 2015-08-18 LAB — BASIC METABOLIC PANEL
Anion gap: 8 (ref 5–15)
BUN: 21 mg/dL — AB (ref 6–20)
CALCIUM: 8.2 mg/dL — AB (ref 8.9–10.3)
CO2: 22 mmol/L (ref 22–32)
CREATININE: 1.84 mg/dL — AB (ref 0.61–1.24)
Chloride: 110 mmol/L (ref 101–111)
GFR calc Af Amer: 43 mL/min — ABNORMAL LOW (ref >60)
GFR, EST NON AFRICAN AMERICAN: 37 mL/min — AB (ref >60)
GLUCOSE: 113 mg/dL — AB (ref 65–99)
Potassium: 4.2 mmol/L (ref 3.5–5.1)
Sodium: 140 mmol/L (ref 135–145)

## 2015-08-18 LAB — CBC
HEMATOCRIT: 29.7 % — AB (ref 39.0–52.0)
HEMOGLOBIN: 9 g/dL — AB (ref 13.0–17.0)
MCH: 27.4 pg (ref 26.0–34.0)
MCHC: 30.3 g/dL (ref 30.0–36.0)
MCV: 90.5 fL (ref 78.0–100.0)
Platelets: 125 10*3/uL — ABNORMAL LOW (ref 150–400)
RBC: 3.28 MIL/uL — AB (ref 4.22–5.81)
RDW: 17.5 % — AB (ref 11.5–15.5)
WBC: 4.4 10*3/uL (ref 4.0–10.5)

## 2015-08-18 MED ORDER — SODIUM CHLORIDE 0.9 % IV SOLN
INTRAVENOUS | Status: DC
  Administered 2015-08-18: 1000 mL via INTRAVENOUS

## 2015-08-18 NOTE — Progress Notes (Signed)
CSW received referral for 042 infected patient.   CSW reviewed chart and pt 042 is not a new diagnosis. Per H&P, pt on medications. Per chart, pt admitted from home [facility] staff following pt while in the hospital for support and plan will be to return home per chart.  No social work needs identified.   Inappropriate CSW referral.   CSW signing off.   Please re-consult if social work needs arise.  Alison Murray, MSW, [place] [employer] [phone]

## 2015-08-18 NOTE — Clinical Documentation Improvement (Signed)
Internal Medicine  Can the diagnosis of CHF be further specified?    Acuity - Acute, Chronic, Acute on Chronic   Type - Systolic, Diastolic, Systolic and Diastolic  Other  Clinically Undetermined  Document any associated diagnoses/conditions Please update your documentation within the medical record to reflect your response to this query. Thank you.  Supporting Information: (As per notes) Pt has hx of CHF "heart failure with an EF of 40-45%."  Component     Latest Ref Rng 08/17/2015  B Natriuretic Peptide     0.0 - 100.0 pg/mL 455.9 (H)    Please exercise your independent, professional judgment when responding. A specific answer is not anticipated or expected.  Thank You, Alessandra Grout, RN, BSN, CCDS,Clinical Documentation Specialist:  [phone]  [phone]=[facility] [place]- Health Information Management

## 2015-08-18 NOTE — Progress Notes (Signed)
[facility] PROGRESS NOTE    Progress Note   Troy Holden [id number] DOB: [birth year] DOA: 08/16/2015 PCP: Suzanna Obey, MD   Brief Narrative:   Troy Holden is an 65 y.o. 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, history of rectal cancer leading to prostatectomy which eventually led to an urgogenic bladder and suprapubic cath placed treated with chemotherapy until August 2014, a PET scan done on November 2014 showed treatment response with almost complete resolution in the size of the rectal mass, however a PET scan repeated in May 2016 showed recurrence of his rectal cancer, at this time he underwent prostatectomy  on a visit with his oncologist in August 2016 he decided not to proceed with palliative chemotherapy as the potential risk of weight the benefits, he doesn't wish to pursue chemotherapy during that visit hospice was introduced as well as end-of-life. He ultimately elected to transition to palliative care at home for pain controlled, is also had several episodes of acute renal failure with the last 07/31/2013 with his creatinine going as high as 5.8 which improve with IV fluid, he subsequently developed sepsis and bacteremia with pan sensitive Escherichia coli most likely due to urinary source at that time nephrostomy tubes had to be placed which are now removed, August 2016 Had New Onset Aflutter with Worsening Diarrhea and Hypovolemic Shock He Was Treated Aggressively with Volume and Conservative Management He Did Not Require Pressors and Hemodynamically Improve, during This Time Seated Was Negative for the Event of Acute Renal Failure and His Creatinine Returned to 1.7 He Was Found to Have an UTI Which Was essentially treated IV fluids a history of osteomyelitis of the sacrum and coccyx is no presents to the ED with  lethargy and gross hematuria. The patient has a chronic indwelling catheter when he noticed he was having bright red urine. There is a nodule with 2 g and a hemoglobin compared to the previous one done 3 months ago. CT scan of the abdomen and pelvis  Assessment/Plan:  Symptomatic anemia/ hematuria He denies any melanotic stools or bright red blood per rectum. He was having significant amount of red urine for several days prior to coming to the hospital - status post 2 units of packed red blood cells and feels much better.  HIV (human immunodeficiency virus infection) (Troy Holden) Continue therapy.  AKI (acute kidney injury) on CKD 3 Likely prerenal in etiology - he was started on IV fluids, improving  Acute thrombocytopenia - consumption related to blood loss from hematuria? - follow  UTI with hematuria/  Urinary tract infection associated with indwelling urethral catheter: Associated with it chronic indwelling Foley catheter. I agree with IV Rocephin urine cultures are pending.  Invasive rectal (squamous cell) cancer  Not a candidate for chemotherapy- apparently was told it is a rare form and slow growing and chemo would do more harm than good- states last 2 PET scans 6 months apart reveal the the cancer is not growing - hospice consult for pain management  Decubitus ulcer Consult wound care for sacral ulcer.    DVT Prophylaxis - SCD's  Family Communication: none Disposition Plan: home 3-4 days Code Status:     Code Status Orders        Start     Ordered   08/17/15 0342  Do not attempt resuscitation (DNR)  Continuous    Question Answer Comment  In the event of cardiac or respiratory ARREST Do not call a "code blue"   In the event of cardiac or respiratory ARREST Do not perform Intubation, CPR, defibrillation or ACLS   In the event of cardiac or respiratory ARREST Use medication by any route, position, wound care, and other measures to relive pain and suffering. May use oxygen,  suction and manual treatment of airway obstruction as needed for comfort.      09-03-15 0346    Code Status History    Date Active Date Inactive Code Status Order ID Comments User Context   05/03/2015  3:57 AM 05/04/2015  4:45 PM Full Code [id number]  Theressa Millard, MD Inpatient   04/13/2013  2:51 AM 04/17/2013  2:59 PM Full Code [id number]  Etta Quill, DO ED   09/29/2011  5:56 PM 10/08/2011  7:46 PM Full Code [id number]  Sindy Guadeloupe, RN Inpatient    Advance Directive Documentation        Most Recent Value   Type of Advance Directive  Out of facility DNR (pink MOST or yellow form)   Pre-existing out of facility DNR order (yellow form or pink MOST form)  Yellow form placed in chart (order not valid for inpatient use)   "MOST" Form in Place?          IV Access:    Peripheral IV   Procedures and diagnostic studies:   Ct Renal Stone Study  09-03-15  CLINICAL DATA:  Acute onset of hematuria.  Initial encounter. EXAM: CT ABDOMEN AND PELVIS WITHOUT CONTRAST TECHNIQUE: Multidetector CT imaging of the abdomen and pelvis was performed following the standard protocol without IV contrast. COMPARISON:  CT of the abdomen and pelvis from 05/03/2015 FINDINGS: Minimal bibasilar atelectasis is noted. Scattered coronary artery calcifications are seen. The liver is unremarkable in appearance. The spleen is significantly enlarged, measuring 16.6 cm and bulky in appearance. Calcification along the lateral edge of the spleen likely reflects remote traumatic injury. A single large stone is noted within the gallbladder. The gallbladder is otherwise grossly unremarkable. The pancreas and adrenal glands are unremarkable in appearance. There is suggestion of splenic varices about the splenic hilum. There is prominence of the extrarenal pelves bilaterally, particularly on the left, with suggestion of underlying mild left-sided hydronephrosis. There is thickening of the renal pelvic walls, raising question  for ureteritis. Mild stranding is noted about the kidneys, and underlying pyelonephritis is a concern. No renal or ureteral stones are identified. No free fluid is identified. The small bowel is unremarkable in appearance. A bowel suture line is noted at the right hemipelvis. The stomach is within normal limits. No acute vascular abnormalities are seen. Diffuse calcification is noted along the abdominal aorta and its branches, with likely moderate to severe luminal narrowing along the common iliac arteries bilaterally. The appendix is not well characterized; there is no evidence of appendicitis. The colon is largely filled with stool. There is diffuse wall thickening along the rectum, which may reflect proctitis. The bladder is diffusely thick walled and irregular in appearance, similar in appearance to the prior study, possibly corresponding to the patient's hematuria. This likely reflects chronic infection, though underlying mass cannot be entirely excluded. The prostate remains normal in size. The patient's Foley catheter is noted in expected position. Prominent bilateral inguinal nodes are noted, measuring up to 1.5 cm on the left. There is significantly worsened erosion involving the distal right sacrum and coccyx, with  absence of the right half of the coccyx, reflecting significant osteomyelitis. The metallic lead of the patient's right flank stimulator traverses this region. Underlying diffuse soft tissue phlegmon is again noted, tracking about the rectum and pelvic soft tissues. Edema tracks along the pelvic sidewalls bilaterally. IMPRESSION: 1. Diffuse phlegmon noted within the lower pelvis, with inflammation about the rectum and bladder. This likely reflects chronic infection within the pelvis, given the patient's perirectal fistula in 2015 and chronic phlegmon on the prior study. 2. 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 3. Diffuse irregular bladder wall thickening again noted. This  may correspond to the patient's hematuria. As this is stable from the prior study, it likely reflects chronic or recurrent infection from the underlying pelvic process. Underlying mass cannot be entirely excluded. 4. Previously noted hydronephrosis has largely resolved, with mild residual left-sided hydronephrosis seen, and prominence of the extrarenal pelves bilaterally. Thickening of the renal pelvic walls raises concern for ureteritis. Mild stranding about the kidneys noted. Underlying pyelonephritis is a concern. 5. Significantly worsened erosion involving the distal right sacrum and coccyx, with absence of the right half of the coccyx, reflecting significant osteomyelitis. This likely reflects infection tracking from the soft tissue phlegmon within the pelvis. Underlying right flank stimulator lead noted, though the chronic infection likely arose from the prior perirectal fistula in this region. 6. Splenomegaly noted. 7. Scattered coronary artery calcifications seen. 8. Cholelithiasis.  Gallbladder otherwise grossly unremarkable. 9. Diffuse calcification along the abdominal aorta and its branches, with likely moderate to severe luminal narrowing along the common iliac arteries bilaterally. 10. Prominent bilateral inguinal nodes noted. These results were called by telephone at the time of interpretation on 08/17/2015 at 2:55 am to Dr. Lacretia Leigh, who verbally acknowledged these results. Electronically Signed   By: Garald Balding M.D.   On: 08/17/2015 03:01     Medical Consultants:    None.  Anti-Infectives:   Anti-infectives    Start     Dose/Rate Route Frequency Ordered Stop   08/17/15 1000  emtricitabine-tenofovir AF (DESCOVY) 200-25 MG per tablet 1 tablet     1 tablet Oral Daily 08/17/15 0346     08/17/15 1000  valACYclovir (VALTREX) tablet 500 mg     500 mg Oral Daily 08/17/15 0346     08/17/15 1000  dolutegravir (TIVICAY) tablet 50 mg     50 mg Oral Daily 08/17/15 0346     08/17/15 0315   cefTRIAXone (ROCEPHIN) 1 g in dextrose 5 % 50 mL IVPB     1 g 100 mL/hr over 30 Minutes Intravenous Every 24 hours 08/17/15 0310        Subjective:    Draiden Olshansky has no complaints today. No nausea, dyspnea, pain or constipation- has chronic diarrhea with incontinence related to his rectal cancer. Wanting to get out of bed and ambulate.   Objective:    Filed Vitals:   08/17/15 1142 08/17/15 1330 08/17/15 2107 08/18/15 0529  BP: 110/57 95/54 106/60 125/66  Pulse: 69 65 67 73  Temp:  98.8 F (37.1 C) 98.8 F (37.1 C) 99 F (37.2 C)  TempSrc:  Oral Oral Oral  Resp:  18 16 16   Weight:      SpO2:  97% 97% 97%    Intake/Output Summary (Last 24 hours) at 08/18/15 1459 Last data filed at 08/18/15 0531  Gross per 24 hour  Intake    720 ml  Output   2905 ml  Net  -2185 ml  Filed Weights   08/17/15 0525  Weight: 62.959 kg (138 lb 12.8 oz)    Exam: Gen:  NAD Cardiovascular:  RRR, No Murmur Chest and lungs:   CTAB Abdomen:  Abdomen soft, NT/ND, + BS Extremities:  No edema   Data Reviewed:    Labs: Basic Metabolic Panel:  Recent Labs Lab 08/17/15 0015 08/18/15 0416  NA 138 140  K 3.7 4.2  CL 106 110  CO2 21* 22  GLUCOSE 125* 113*  BUN 26* 21*  CREATININE 2.11* 1.84*  CALCIUM 8.0* 8.2*   GFR Estimated Creatinine Clearance: 36.6 mL/min (by C-G formula based on Cr of 1.84). Liver Function Tests: No results for input(s): AST, ALT, ALKPHOS, BILITOT, PROT, ALBUMIN in the last 168 hours. No results for input(s): LIPASE, AMYLASE in the last 168 hours. No results for input(s): AMMONIA in the last 168 hours. Coagulation profile  Recent Labs Lab 08/17/15 0347  INR 1.20    CBC:  Recent Labs Lab 08/17/15 0015 08/18/15 0416  WBC 4.7 4.4  NEUTROABS 3.4  --   HGB 7.6* 9.0*  HCT 24.9* 29.7*  MCV 90.2 90.5  PLT 125* 125*   Cardiac Enzymes: No results for input(s): CKTOTAL, CKMB, CKMBINDEX, TROPONINI in the last 168 hours. BNP (last 3 results) No  results for input(s): PROBNP in the last 8760 hours. CBG:  Recent Labs Lab 08/17/15 0746  GLUCAP 93   D-Dimer: No results for input(s): DDIMER in the last 72 hours. Hgb A1c: No results for input(s): HGBA1C in the last 72 hours. Lipid Profile: No results for input(s): CHOL, HDL, LDLCALC, TRIG, CHOLHDL, LDLDIRECT in the last 72 hours. Thyroid function studies: No results for input(s): TSH, T4TOTAL, T3FREE, THYROIDAB in the last 72 hours.  Invalid input(s): FREET3 Anemia work up: No results for input(s): VITAMINB12, FOLATE, FERRITIN, TIBC, IRON, RETICCTPCT in the last 72 hours. Sepsis Labs:  Recent Labs Lab 08/17/15 0015 08/18/15 0416  WBC 4.7 4.4   Microbiology Recent Results (from the past 240 hour(s))  Urine culture     Status: None (Preliminary result)   Collection Time: 08/16/15 11:59 PM  Result Value Ref Range Status   Specimen Description URINE, CATHETERIZED  Final   Special Requests NONE  Final   Culture   Final    CULTURE REINCUBATED FOR BETTER GROWTH Performed at [facility]    Report Status PENDING  Incomplete     Medications:   . cefTRIAXone (ROCEPHIN)  IV  1 g Intravenous Q24H  . diphenoxylate-atropine  2 tablet Oral QID  . dolutegravir  50 mg Oral Daily  . emtricitabine-tenofovir AF  1 tablet Oral Daily  . feeding supplement (ENSURE ENLIVE)  237 mL Oral BID BM  . [START ON 08/19/2015] fentaNYL  100 mcg Transdermal Q72H  . gabapentin  300 mg Oral TID  . levothyroxine  112 mcg Oral QAC breakfast  . loperamide  2 mg Oral TID PC & HS  . metoprolol succinate  50 mg Oral QODAY  . pantoprazole  40 mg Oral Daily  . valACYclovir  500 mg Oral Daily   Continuous Infusions: . sodium chloride      Time spent: 25 min   LOS: 1 day   Debbe Odea, MD [facility] Pager [phone]  *Please refer to [email or web address], password [email or web address] to get updated schedule on who will round on this patient, as hospitalists switch teams weekly. If 7PM-7AM, please  contact night-coverage at [email or web address], password [email or web address] for any overnight needs.  08/18/2015, 2:59 PM

## 2015-08-18 NOTE — Progress Notes (Addendum)
[place] [facility] RN Visit  This is a related admission to [facility] diagnosis of Rectal Cancer.  Patient is a DNR. Patient seen in room, sitting on side of bed.  He verbalized frustration in being in the hospital, and voiced he looks forward to going home with hospice care soon.  He rated his pain 9/10 and staff RN was notified for pain medication during visit.  He stated the pain is "all over."  Per our conversation, patient stated his [place] worker is currently working with him to get increased care in the home, as his functional status continues to decline with an increase in fatigue.  He denied any additional DME needs at time of discharge. Patient stated he had some disagreements with staff this morning, however, he called his chaplain and they are on the way to the hospital to assist him with his spiritual needs.   Updated [facility] medication list and transfer summary placed on chart.  [facility] will continue to follow and anticipate any discharge needs.  Please call with any questions.  Thank you, Freddi Starr RN, [facility] Liaison [phone]

## 2015-08-18 NOTE — Progress Notes (Addendum)
Pt angry and yelling and tech. Wants to take a shower and go outside to smoke a cigarette and walk through the garden at the cancer center. He states Dr. Wynelle Cleveland stated he could. Explained we needed a MD order and I needed to call her. He didn't want to wait. Yelling he thought everyone is incompetent. Yelling at Jena Gauss the director. He was ambulating through out the day, tolerated well. Walking with a cane. Pt encouraged to drink at least 2 more pitchers of water tonight to decrease his creatine. IV was reddened and had to be dc'd. Dr. Wynelle Cleveland notified and IV fluids DC'D. Pt possibley being dc'd 1/24. Pt also refusing CBGs.

## 2015-08-18 NOTE — Consult Note (Signed)
WOC wound consult note Reason for Consult:Sacral skin injury Patient is gracious, but denies any disturbance on his skin.  He is moving about independently in the bed, and while cooperative, denies the need for a [place].  Bedside RN in with [place] Nurse and has seen sacral tissue  Within the last 24 hours and agrees that skin is intact.  [facility] nursing team will not follow, but will remain available to this patient, the nursing and medical teams.  Please re-consult if needed. Thanks, Maudie Flakes, MSN, RN, [place], Arther Abbott  Pager# [phone]

## 2015-08-18 NOTE — Progress Notes (Signed)
Hospice and [facility]) Chaplain Visit: Met with pt in hospital room, who was relaxed and engaging, open about feeling better, and thankful for opportunity to continue to process need for hospitalization for symptom management and his goals for care with hospice.  Pt also reflective of friendship and life lessons and his spirituality, affirming his desire to live fully with comfort and quality even though curative measures are not available.  Pt's ministers are already updated and supportive.  Pt looking forward to discharge home tomorrow, and to continued conversations with chaplain. Troy Holden, ThM, [place]

## 2015-08-19 DIAGNOSIS — T83511D Infection and inflammatory reaction due to indwelling urethral catheter, subsequent encounter: Secondary | ICD-10-CM

## 2015-08-19 LAB — BASIC METABOLIC PANEL
ANION GAP: 7 (ref 5–15)
BUN: 21 mg/dL — ABNORMAL HIGH (ref 6–20)
CHLORIDE: 109 mmol/L (ref 101–111)
CO2: 24 mmol/L (ref 22–32)
CREATININE: 1.72 mg/dL — AB (ref 0.61–1.24)
Calcium: 8.5 mg/dL — ABNORMAL LOW (ref 8.9–10.3)
GFR calc non Af Amer: 41 mL/min — ABNORMAL LOW (ref >60)
GFR, EST AFRICAN AMERICAN: 47 mL/min — AB (ref >60)
Glucose, Bld: 103 mg/dL — ABNORMAL HIGH (ref 65–99)
POTASSIUM: 4 mmol/L (ref 3.5–5.1)
Sodium: 140 mmol/L (ref 135–145)

## 2015-08-19 LAB — CBC
HEMATOCRIT: 27.3 % — AB (ref 39.0–52.0)
Hemoglobin: 8.3 g/dL — ABNORMAL LOW (ref 13.0–17.0)
MCH: 27.1 pg (ref 26.0–34.0)
MCHC: 30.4 g/dL (ref 30.0–36.0)
MCV: 89.2 fL (ref 78.0–100.0)
PLATELETS: 121 10*3/uL — AB (ref 150–400)
RBC: 3.06 MIL/uL — AB (ref 4.22–5.81)
RDW: 17.2 % — ABNORMAL HIGH (ref 11.5–15.5)
WBC: 3.7 10*3/uL — ABNORMAL LOW (ref 4.0–10.5)

## 2015-08-19 MED ORDER — CEPHALEXIN 500 MG PO CAPS: 500.0000 mg | ORAL_CAPSULE | Freq: Three times a day (TID) | ORAL | Status: DC

## 2015-08-19 MED ORDER — CEPHALEXIN 500 MG PO CAPS: 500.0000 mg | ORAL_CAPSULE | Freq: Three times a day (TID) | ORAL | Status: AC

## 2015-08-19 NOTE — Progress Notes (Signed)
[place] [facility] RN Visit  This is a related admission to [facility] diagnosis of Rectal Cancer. Patient is a DNR. Patient seen in room, laying in bed. Patient is eager to go home and stated he is awaiting MD to see if he is ready for discharge.  He denies any needs upon discharge. His urine appears clear and he denies any bloody stools.  Per chart review, patient has received 3 doses of 4mg  Dilaudid po in the past 24 hours for pain.  His O2 sats remain 99% on RA and respirations WNL.  [facility] will continue to follow and anticipate any discharge needs.  Please call with any questions.  Thank you, Freddi Starr RN, [facility] Liaison [phone]

## 2015-08-19 NOTE — Discharge Summary (Signed)
Physician Discharge Summary  Troy Holden [id number] DOB: [birth year] DOA: 08/16/2015  PCP: Suzanna Obey, MD  Admit date: 08/16/2015 Discharge date: 08/19/2015  Time spent: 35 minutes  Recommendations for Outpatient Follow-up:  1. Follow-up with primary care doctor as an outpatient. 2. Hospice to follow-up at home   Discharge Diagnoses:  Principal Problem:   Symptomatic anemia Active Problems:   HIV (human immunodeficiency virus infection) ([place])   AKI (acute kidney injury) ([place])   Urinary tract infection associated with indwelling urethral catheter   Encounter for palliative care   Discharge Condition: stable  Diet recommendation: regular  Filed Weights   08/17/15 0525  Weight: 62.959 kg (138 lb 12.8 oz)    History of present illness:  Troy Holden is an 65 y.o. 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 He ultimately elected to transition to palliative care at home for pain controlled, is also had several episodes of acute renal failure with the last 07/31/2013 with his creatinine going as high as 5.8 which improve with IV fluid, he subsequently developed sepsis and  bacteremia with pan sensitive Escherichia coli most likely due to urinary source at that time nephrostomy tubes had to be placed which are now removed, August 2016 Had New Onset Aflutter with Worsening Diarrhea and Hypovolemic Shock He Was Treated Aggressively with Volume and Conservative Management He Did Not Require Pressors and Hemodynamically Improve, during This Time Seated Was Negative for the Event of Acute Renal Failure and His Creatinine Returned to 1.7 He Was Found to Have an UTI Which Was essentially treated IV fluids a history of osteomyelitis of the sacrum and coccyx is no presents to the ED with lethargy and gross hematuria. The patient has a chronic indwelling catheter when he noticed he was having bright red urine. There is a nodule with 2 g and a hemoglobin compared to the previous one done 3 months ago. CT scan of the abdomen and pelvis  Hospital Course:  Symptomatic anemia: He denies any metabolic stools or bright blood per rectum. He was having hematuria which has resolved prior to coming to the hospital. He was transfused 2 units of packed red blood cells, his hemoglobin came up to 8.3. It was discussed with the patient about his poor prognosis hospice will continue to follow at home.  HIV (human immunodeficiency virus infection) ([place]) Continue therapy.  AKI (acute kidney injury) on CKD 3 Likely prerenal in etiology, resolved with IV hydration.  Acute thrombocytopenia Resolved unclear etiology.  UTI with hematuria/ Urinary tract infection associated with indwelling urethral catheter: Associated with it chronic indwelling Foley catheter. Urine culture grew Escherichia coli you continue Keflex as an outpatient for 5 more days.  Invasive rectal (squamous cell) cancer  Not a candidate for chemotherapy- apparently was told it is a rare form and slow growing and chemo would do more harm than good- states last 2 PET scans 6 months apart reveal the the cancer is not growing -  hospice consult for pain management  Sacral decubitus ulcer: Patient refuse examination.  Procedures:  CT renal  Consultations:  none  Discharge Exam: Filed Vitals:   08/18/15 0529 08/18/15 1500  BP: 125/66 112/81  Pulse: 73 91  Temp: 99 F (37.2 C) 98.4 F (36.9 C)  Resp: 16 16    General: A&O x3 Cardiovascular: RRR Respiratory: good air movement CTA B/L  Discharge Instructions   Discharge Instructions    Diet - low sodium heart healthy    Complete by:  As directed      Increase activity slowly    Complete by:  As directed           Current Discharge Medication List    START taking these medications   Details  cephALEXin (KEFLEX) 500 MG capsule Take 1 capsule (500 mg total) by mouth every 8 (eight) hours. Qty: 15 capsule, Refills: 0      CONTINUE these medications which have NOT CHANGED   Details  DESCOVY 200-25 MG tablet Take 1 tablet by mouth daily. Refills: 11    diazepam (VALIUM) 5 MG tablet Take 5 mg by mouth at bedtime as needed for anxiety.    diphenoxylate-atropine (LOMOTIL) 2.5-0.025 MG per tablet Take 2 tablets by mouth 4 (four) times daily.     dolutegravir (TIVICAY) 50 MG tablet Take 50 mg by mouth daily.    fentaNYL (DURAGESIC - DOSED MCG/HR) 100 MCG/HR Place 100 mcg onto the skin every 3 (three) days.    gabapentin (NEURONTIN) 300 MG capsule Take 300 mg by mouth 3 (three) times daily.    HYDROmorphone (DILAUDID) 4 MG tablet Take 4 mg by mouth every 4 (four) hours as needed for moderate pain or severe pain.    levothyroxine (SYNTHROID, LEVOTHROID) 112 MCG tablet Take 112 mcg by mouth daily before breakfast.    loperamide (IMODIUM) 2 MG capsule Take 2 mg by mouth 4 (four) times daily - after meals and at bedtime.     metoprolol succinate (TOPROL-XL) 50 MG 24 hr tablet Take 50 mg by mouth every other day.     pantoprazole (PROTONIX) 40 MG tablet Take 40 mg by mouth daily.    simethicone (MYLICON) 0000000 MG chewable tablet Chew 125 mg by  mouth every 6 (six) hours as needed for flatulence.    valACYclovir (VALTREX) 500 MG tablet Take 500 mg by mouth daily.     nitroGLYCERIN (NITROSTAT) 0.4 MG SL tablet Place 0.4 mg under the tongue every 5 (five) minutes as needed for chest pain.        Allergies  Allergen Reactions  . Lidocaine Hcl Rash    Patient had to be rushed to hospital from dentist office after receiving xylocaine  . Trazodone And Nefazodone Other (See Comments)    Nightmares, hangover like feeling for 2-3 days  . Oxycodone Other (See Comments) and Palpitations    Made him off balance and loopy Dizzy, nausea and tachycardia      The results of significant diagnostics from this hospitalization (including imaging, microbiology, ancillary and laboratory) are listed below for reference.    Significant Diagnostic Studies: Ct Renal Stone Study  08/17/2015  CLINICAL DATA:  Acute onset of hematuria.  Initial encounter. EXAM: CT ABDOMEN AND PELVIS WITHOUT CONTRAST  TECHNIQUE: Multidetector CT imaging of the abdomen and pelvis was performed following the standard protocol without IV contrast. COMPARISON:  CT of the abdomen and pelvis from 05/03/2015 FINDINGS: Minimal bibasilar atelectasis is noted. Scattered coronary artery calcifications are seen. The liver is unremarkable in appearance. The spleen is significantly enlarged, measuring 16.6 cm and bulky in appearance. Calcification along the lateral edge of the spleen likely reflects remote traumatic injury. A single large stone is noted within the gallbladder. The gallbladder is otherwise grossly unremarkable. The pancreas and adrenal glands are unremarkable in appearance. There is suggestion of splenic varices about the splenic hilum. There is prominence of the extrarenal pelves bilaterally, particularly on the left, with suggestion of underlying mild left-sided hydronephrosis. There is thickening of the renal pelvic walls, raising question for ureteritis. Mild stranding is  noted about the kidneys, and underlying pyelonephritis is a concern. No renal or ureteral stones are identified. No free fluid is identified. The small bowel is unremarkable in appearance. A bowel suture line is noted at the right hemipelvis. The stomach is within normal limits. No acute vascular abnormalities are seen. Diffuse calcification is noted along the abdominal aorta and its branches, with likely moderate to severe luminal narrowing along the common iliac arteries bilaterally. The appendix is not well characterized; there is no evidence of appendicitis. The colon is largely filled with stool. There is diffuse wall thickening along the rectum, which may reflect proctitis. The bladder is diffusely thick walled and irregular in appearance, similar in appearance to the prior study, possibly corresponding to the patient's hematuria. This likely reflects chronic infection, though underlying mass cannot be entirely excluded. The prostate remains normal in size. The patient's Foley catheter is noted in expected position. Prominent bilateral inguinal nodes are noted, measuring up to 1.5 cm on the left. There is significantly worsened erosion involving the distal right sacrum and coccyx, with absence of the right half of the coccyx, reflecting significant osteomyelitis. The metallic lead of the patient's right flank stimulator traverses this region. Underlying diffuse soft tissue phlegmon is again noted, tracking about the rectum and pelvic soft tissues. Edema tracks along the pelvic sidewalls bilaterally. IMPRESSION: 1. Diffuse phlegmon noted within the lower pelvis, with inflammation about the rectum and bladder. This likely reflects chronic infection within the pelvis, given the patient's perirectal fistula in 2015 and chronic phlegmon on the prior study. 2. 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 3. Diffuse irregular bladder wall thickening again noted. This may correspond to the patient's  hematuria. As this is stable from the prior study, it likely reflects chronic or recurrent infection from the underlying pelvic process. Underlying mass cannot be entirely excluded. 4. Previously noted hydronephrosis has largely resolved, with mild residual left-sided hydronephrosis seen, and prominence of the extrarenal pelves bilaterally. Thickening of the renal pelvic walls raises concern for ureteritis. Mild stranding about the kidneys noted. Underlying pyelonephritis is a concern. 5. Significantly worsened erosion involving the distal right sacrum and coccyx, with absence of the right half of the coccyx, reflecting significant osteomyelitis. This likely reflects infection tracking from the soft tissue phlegmon within the pelvis. Underlying right flank stimulator lead noted, though the chronic infection likely arose from the prior perirectal fistula in this region. 6. Splenomegaly noted. 7. Scattered coronary artery calcifications seen. 8. Cholelithiasis.  Gallbladder otherwise grossly unremarkable. 9. Diffuse calcification along the abdominal aorta and its branches, with likely moderate to severe luminal narrowing along the common iliac arteries bilaterally. 10. Prominent bilateral inguinal nodes noted.  These results were called by telephone at the time of interpretation on 08/17/2015 at 2:55 am to Dr. Lacretia Leigh, who verbally acknowledged these results. Electronically Signed   By: Garald Balding M.D.   On: 08/17/2015 03:01    Microbiology: Recent Results (from the past 240 hour(s))  Urine culture     Status: None (Preliminary result)   Collection Time: 08/16/15 11:59 PM  Result Value Ref Range Status   Specimen Description URINE, CATHETERIZED  Final   Special Requests NONE  Final   Culture   Final    >=100,000 COLONIES/mL KLEBSIELLA PNEUMONIAE Performed at [facility]    Report Status PENDING  Incomplete   Organism ID, Bacteria KLEBSIELLA PNEUMONIAE  Final      Susceptibility    Klebsiella pneumoniae - MIC*    AMPICILLIN >=32 RESISTANT Resistant     CEFAZOLIN <=4 SENSITIVE Sensitive     CEFTRIAXONE <=1 SENSITIVE Sensitive     CIPROFLOXACIN <=0.25 SENSITIVE Sensitive     GENTAMICIN <=1 SENSITIVE Sensitive     IMIPENEM <=0.25 SENSITIVE Sensitive     NITROFURANTOIN 64 INTERMEDIATE Intermediate     TRIMETH/SULFA <=20 SENSITIVE Sensitive     AMPICILLIN/SULBACTAM 4 SENSITIVE Sensitive     PIP/TAZO 8 SENSITIVE Sensitive     * >=100,000 COLONIES/mL KLEBSIELLA PNEUMONIAE     Labs: Basic Metabolic Panel:  Recent Labs Lab 08/17/15 0015 08/18/15 0416 08/19/15 0409  NA 138 140 140  K 3.7 4.2 4.0  CL 106 110 109  CO2 21* 22 24  GLUCOSE 125* 113* 103*  BUN 26* 21* 21*  CREATININE 2.11* 1.84* 1.72*  CALCIUM 8.0* 8.2* 8.5*   Liver Function Tests: No results for input(s): AST, ALT, ALKPHOS, BILITOT, PROT, ALBUMIN in the last 168 hours. No results for input(s): LIPASE, AMYLASE in the last 168 hours. No results for input(s): AMMONIA in the last 168 hours. CBC:  Recent Labs Lab 08/17/15 0015 08/18/15 0416 08/19/15 0409  WBC 4.7 4.4 3.7*  NEUTROABS 3.4  --   --   HGB 7.6* 9.0* 8.3*  HCT 24.9* 29.7* 27.3*  MCV 90.2 90.5 89.2  PLT 125* 125* 121*   Cardiac Enzymes: No results for input(s): CKTOTAL, CKMB, CKMBINDEX, TROPONINI in the last 168 hours. BNP: BNP (last 3 results)  Recent Labs  08/17/15 0347  BNP 455.9*    ProBNP (last 3 results) No results for input(s): PROBNP in the last 8760 hours.  CBG:  Recent Labs Lab 08/17/15 0746  GLUCAP 93       Signed:  Charlynne Cousins MD.  [facility] 08/19/2015, 12:43 PM

## 2015-08-20 LAB — URINE CULTURE: Culture: >=100000

## 2015-11-07 IMAGING — US US RENAL
1 series · 14 of 25 positions shown · non-contrast
Comparison: CT 05/03/2015. CT 12/20/2013. Multiple previous CT and
ultrasound studies.

CLINICAL DATA: Bilateral flank pain over the last year with history
of hydronephrosis. HIV positive.

EXAM:
RENAL / URINARY TRACT ULTRASOUND COMPLETE

[Series 1: us renal · 0.22mm/px · 14 of 50 slices shown]
[im 1/50]
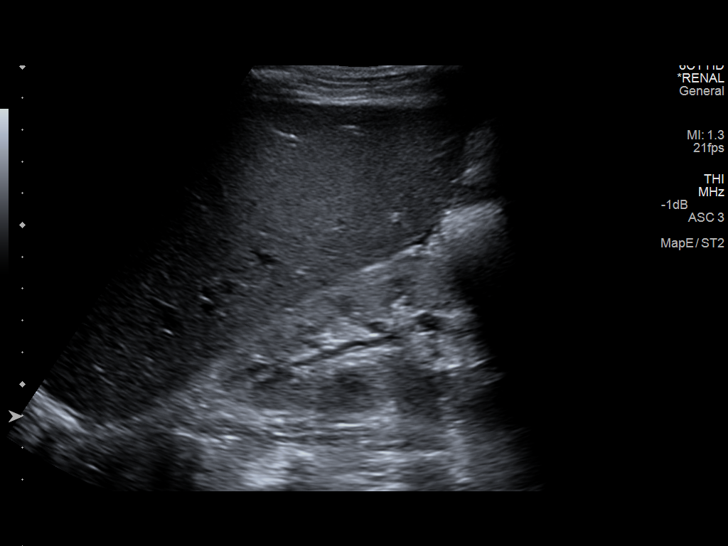
[im 5/50]
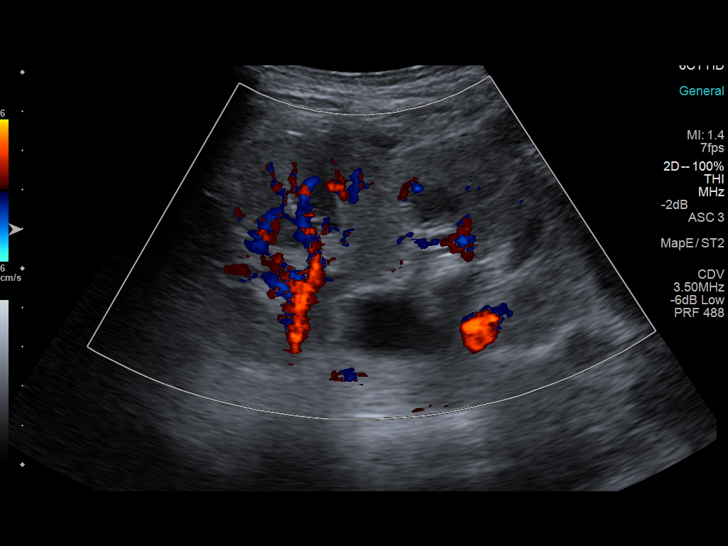
[im 9/50]
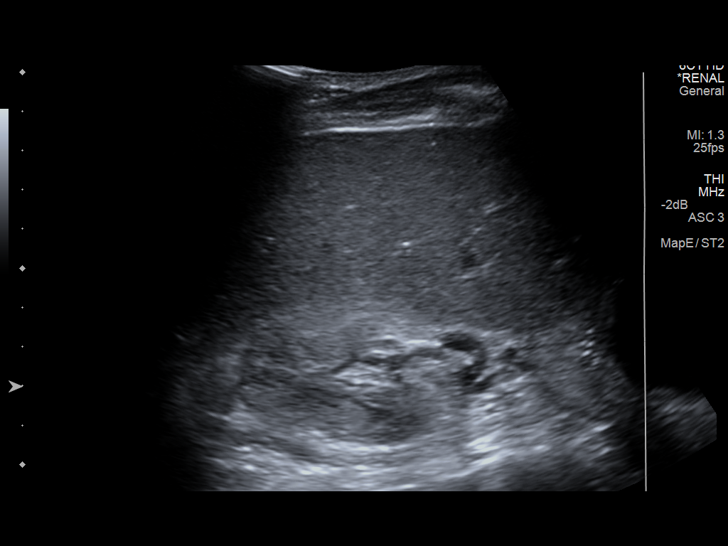
[im 13/50]
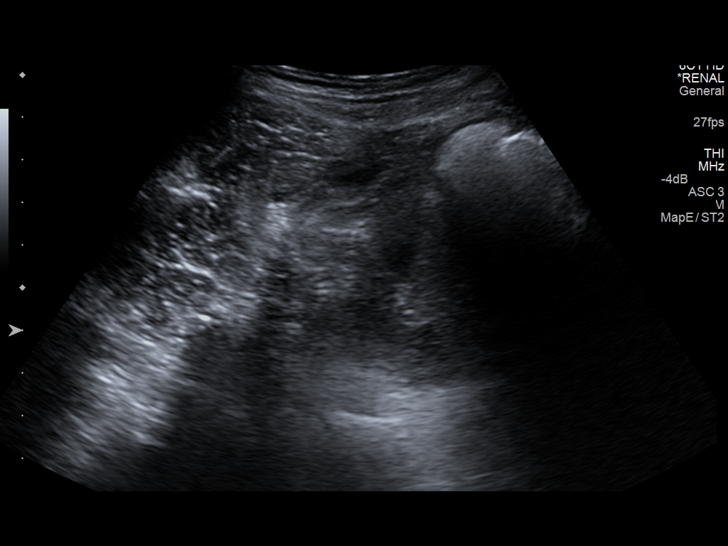
[im 17/50]
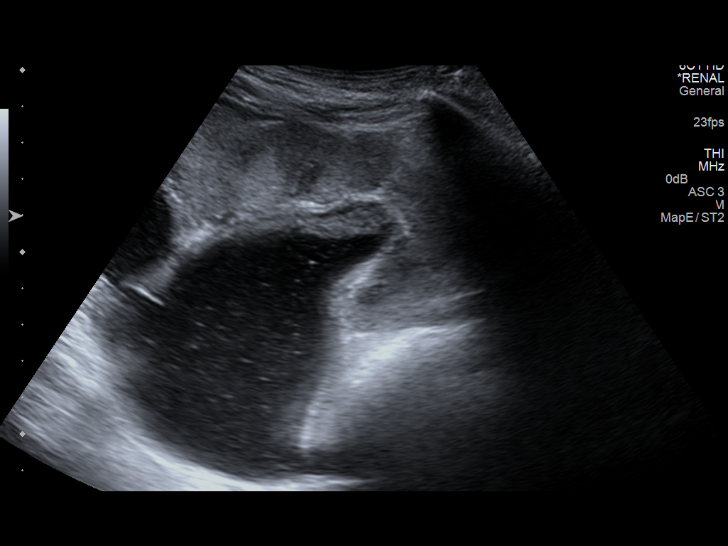
[im 19/50]
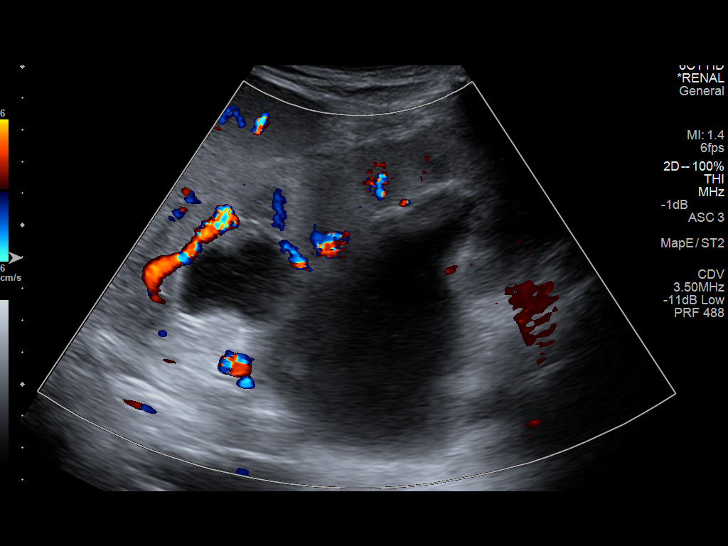
[im 23/50]
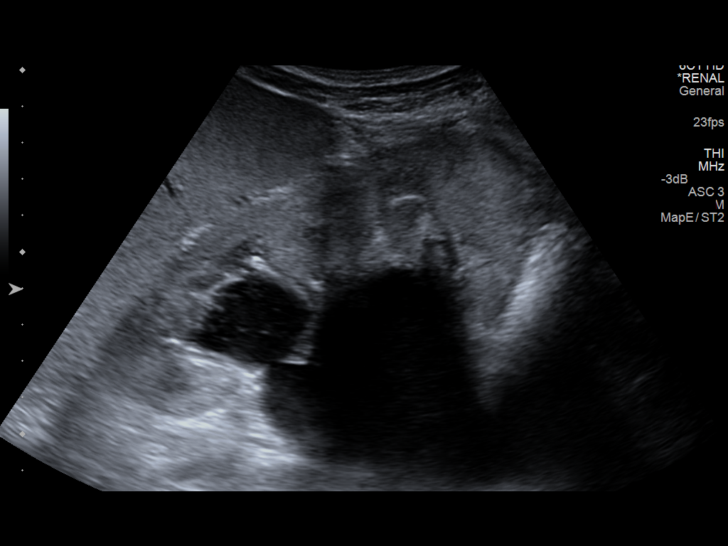
[im 27/50]
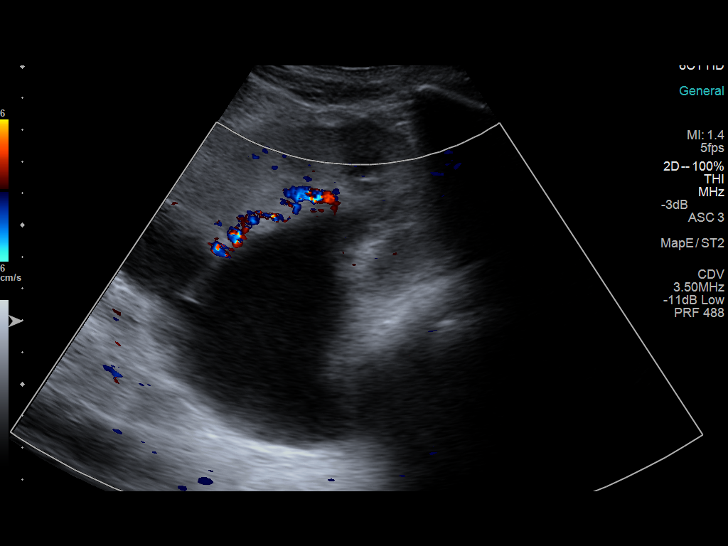
[im 31/50]
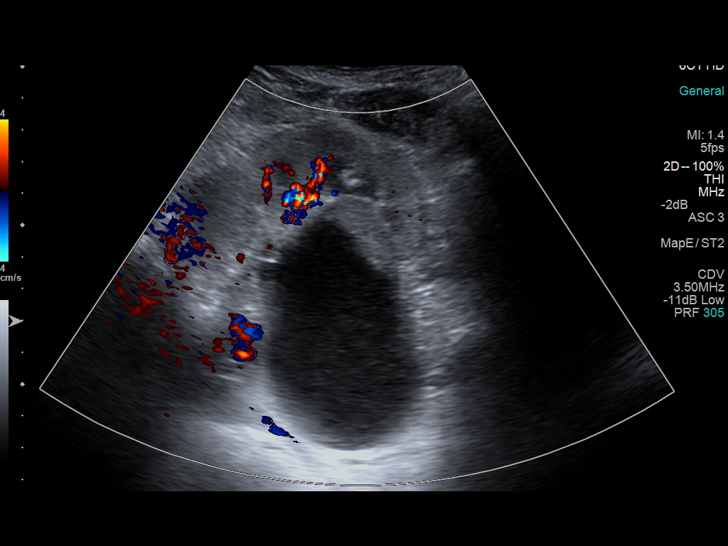
[im 33/50]
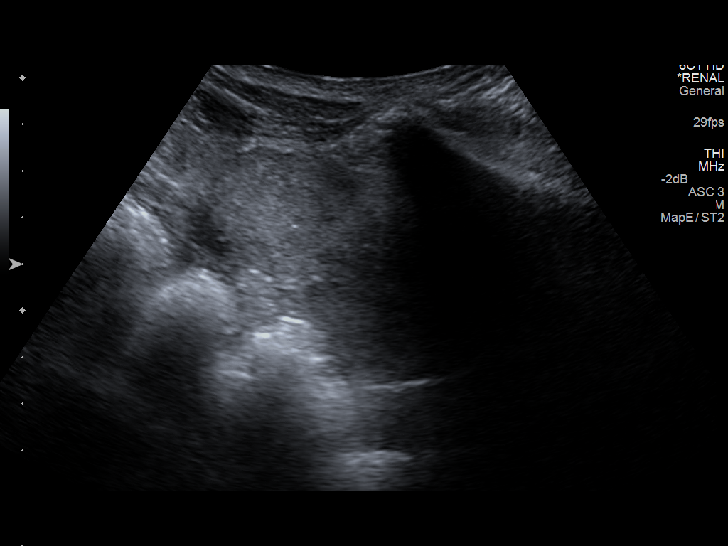
[im 37/50]
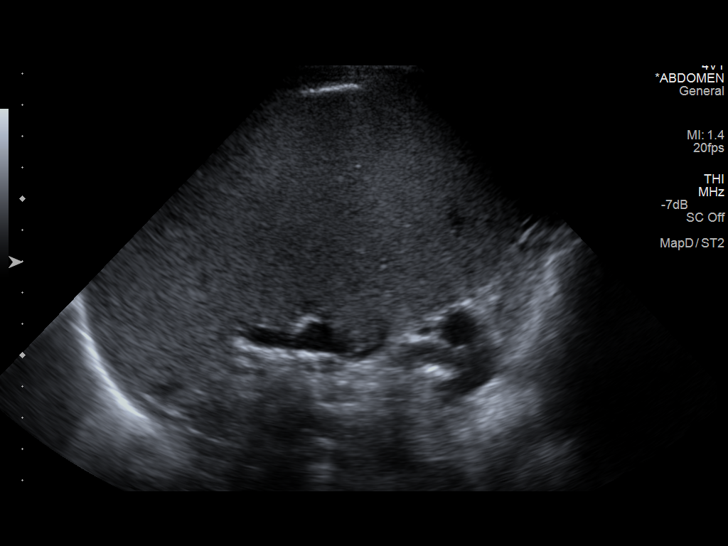
[im 41/50]
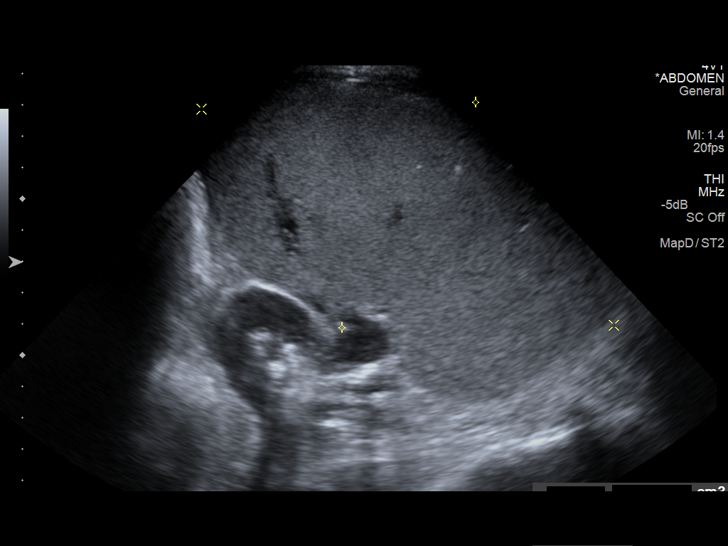
[im 45/50]
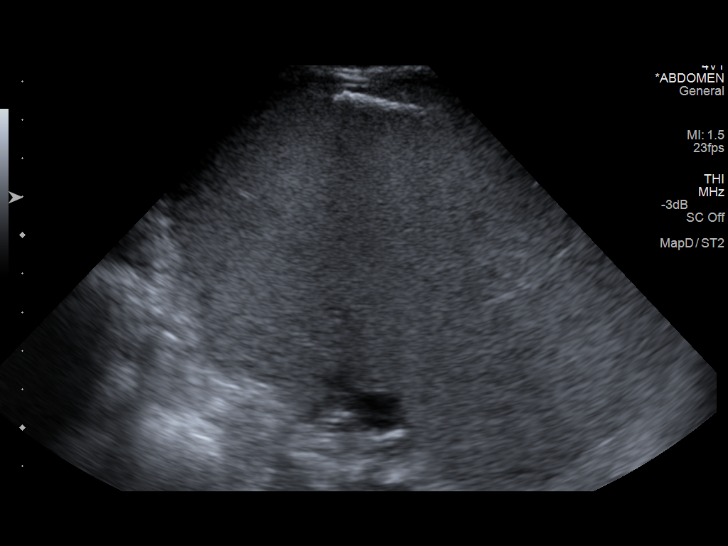
[im 50/50]
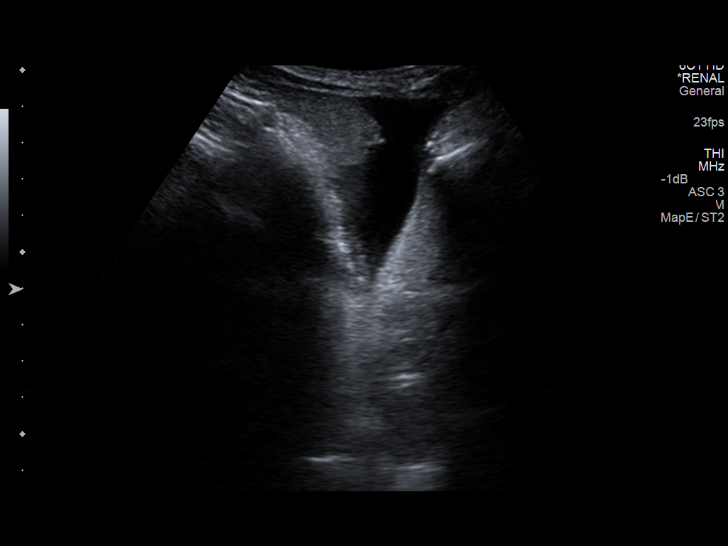

[14 of 25 positions shown; findings below may reference images not displayed]

FINDINGS: Right Kidney:

Length: 6964 cm. Diffusely echogenic. Mild to moderate fullness of
the right renal pelvis. Calices do not appear dilated. No visible
stone.

Left Kidney:

Length: 14 cm in length. Diffusely echogenic. Markedly dilated renal
pelvis and proximal ureter. Calices are dilated on this side.

Bladder:

Foley catheter in the bladder.  Thick walled bladder appear
IMPRESSION: Echogenic kidneys consistent with renal parenchymal disease. This
can be seen with HIV.

Prominent extra renal pelvis on the right. Similar appearance to the
study of 4332.

Worsened obstruction pattern on the left with dilated calices,
extrarenal pelvis and ureter. This could be due to relative
obstruction at a thick-walled bladder.

## 2015-11-24 DEATH — deceased
